# Patient Record
Sex: Female | Born: 1980
Health system: Southern US, Community
[De-identification: ages and names within clinical notes are randomized; demographics above are authoritative.]

## PROBLEM LIST (undated history)

## (undated) DIAGNOSIS — G43909 Migraine, unspecified, not intractable, without status migrainosus: Secondary | ICD-10-CM

## (undated) DIAGNOSIS — K760 Fatty (change of) liver, not elsewhere classified: Secondary | ICD-10-CM

## (undated) DIAGNOSIS — K649 Unspecified hemorrhoids: Secondary | ICD-10-CM

## (undated) DIAGNOSIS — E782 Mixed hyperlipidemia: Secondary | ICD-10-CM

## (undated) DIAGNOSIS — Z8669 Personal history of other diseases of the nervous system and sense organs: Secondary | ICD-10-CM

## (undated) DIAGNOSIS — N825 Female genital tract-skin fistulae: Secondary | ICD-10-CM

## (undated) DIAGNOSIS — Z800 Family history of malignant neoplasm of digestive organs: Secondary | ICD-10-CM

## (undated) DIAGNOSIS — J309 Allergic rhinitis, unspecified: Secondary | ICD-10-CM

## (undated) DIAGNOSIS — O1200 Gestational edema, unspecified trimester: Secondary | ICD-10-CM

## (undated) DIAGNOSIS — Z803 Family history of malignant neoplasm of breast: Secondary | ICD-10-CM

## (undated) DIAGNOSIS — F3289 Other specified depressive episodes: Secondary | ICD-10-CM

## (undated) DIAGNOSIS — Z8759 Personal history of other complications of pregnancy, childbirth and the puerperium: Secondary | ICD-10-CM

## (undated) DIAGNOSIS — M549 Dorsalgia, unspecified: Secondary | ICD-10-CM

## (undated) DIAGNOSIS — K219 Gastro-esophageal reflux disease without esophagitis: Secondary | ICD-10-CM

## (undated) DIAGNOSIS — Z87440 Personal history of urinary (tract) infections: Secondary | ICD-10-CM

## (undated) DIAGNOSIS — O139 Gestational [pregnancy-induced] hypertension without significant proteinuria, unspecified trimester: Secondary | ICD-10-CM

## (undated) DIAGNOSIS — F1010 Alcohol abuse, uncomplicated: Secondary | ICD-10-CM

## (undated) DIAGNOSIS — H40009 Preglaucoma, unspecified, unspecified eye: Secondary | ICD-10-CM

## (undated) DIAGNOSIS — F329 Major depressive disorder, single episode, unspecified: Secondary | ICD-10-CM

## (undated) DIAGNOSIS — F419 Anxiety disorder, unspecified: Secondary | ICD-10-CM

## (undated) DIAGNOSIS — F411 Generalized anxiety disorder: Secondary | ICD-10-CM

## (undated) DIAGNOSIS — J302 Other seasonal allergic rhinitis: Secondary | ICD-10-CM

## (undated) DIAGNOSIS — E282 Polycystic ovarian syndrome: Secondary | ICD-10-CM

## (undated) DIAGNOSIS — Z9189 Other specified personal risk factors, not elsewhere classified: Secondary | ICD-10-CM

## (undated) DIAGNOSIS — R109 Unspecified abdominal pain: Secondary | ICD-10-CM

## (undated) DIAGNOSIS — E559 Vitamin D deficiency, unspecified: Secondary | ICD-10-CM

## (undated) DIAGNOSIS — E669 Obesity, unspecified: Secondary | ICD-10-CM

## (undated) DIAGNOSIS — K7689 Other specified diseases of liver: Secondary | ICD-10-CM

## (undated) DIAGNOSIS — Z98891 History of uterine scar from previous surgery: Secondary | ICD-10-CM

## (undated) DIAGNOSIS — T7840XA Allergy, unspecified, initial encounter: Secondary | ICD-10-CM

## (undated) DIAGNOSIS — E739 Lactose intolerance, unspecified: Secondary | ICD-10-CM

## (undated) DIAGNOSIS — Z801 Family history of malignant neoplasm of trachea, bronchus and lung: Secondary | ICD-10-CM

## (undated) DIAGNOSIS — F32A Depression, unspecified: Secondary | ICD-10-CM

## (undated) DIAGNOSIS — Z9889 Other specified postprocedural states: Secondary | ICD-10-CM

## (undated) DIAGNOSIS — Z8371 Family history of colonic polyps: Secondary | ICD-10-CM

## (undated) DIAGNOSIS — H409 Unspecified glaucoma: Secondary | ICD-10-CM

## (undated) DIAGNOSIS — E88819 Insulin resistance, unspecified: Secondary | ICD-10-CM

## (undated) HISTORY — DX: Family history of malignant neoplasm of trachea, bronchus and lung: Z80.1

## (undated) HISTORY — DX: Dorsalgia, unspecified: M54.9

## (undated) HISTORY — DX: Other specified personal risk factors, not elsewhere classified: Z91.89

## (undated) HISTORY — DX: Other specified diseases of liver: K76.89

## (undated) HISTORY — PX: KNEE ARTHROSCOPY: SHX127

## (undated) HISTORY — DX: Generalized anxiety disorder: F41.1

## (undated) HISTORY — DX: Family history of malignant neoplasm of digestive organs: Z80.0

## (undated) HISTORY — DX: Obesity, unspecified: E66.9

## (undated) HISTORY — DX: Gestational edema, unspecified trimester: O12.00

## (undated) HISTORY — DX: Allergy, unspecified, initial encounter: T78.40XA

## (undated) HISTORY — DX: Gestational (pregnancy-induced) hypertension without significant proteinuria, unspecified trimester: O13.9

## (undated) HISTORY — DX: Family history of malignant neoplasm of breast: Z80.3

## (undated) HISTORY — DX: Migraine, unspecified, not intractable, without status migrainosus: G43.909

## (undated) HISTORY — DX: Unspecified glaucoma: H40.9

## (undated) HISTORY — DX: Other specified postprocedural states: Z98.89

## (undated) HISTORY — DX: Unspecified abdominal pain: R10.9

## (undated) HISTORY — DX: Major depressive disorder, single episode, unspecified: F32.9

## (undated) HISTORY — DX: Other specified depressive episodes: F32.89

## (undated) HISTORY — DX: Polycystic ovarian syndrome: E28.2

## (undated) HISTORY — DX: Gastro-esophageal reflux disease without esophagitis: K21.9

## (undated) HISTORY — DX: Fatty (change of) liver, not elsewhere classified: K76.0

## (undated) HISTORY — DX: Alcohol abuse, uncomplicated: F10.10

## (undated) HISTORY — DX: Family history of colonic polyps: Z83.71

## (undated) HISTORY — DX: Vitamin D deficiency, unspecified: E55.9

## (undated) HISTORY — DX: Lactose intolerance, unspecified: E73.9

## (undated) HISTORY — DX: Unspecified hemorrhoids: K64.9

## (undated) HISTORY — PX: OTHER SURGICAL HISTORY: SHX169

## (undated) HISTORY — DX: Allergic rhinitis, unspecified: J30.9

---
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
## 2002-05-25 HISTORY — PX: OTHER SURGICAL HISTORY: SHX169

## 2002-05-25 HISTORY — PX: WISDOM TOOTH EXTRACTION: SHX21

## 2003-01-08 ENCOUNTER — Other Ambulatory Visit: Admission: RE | Admit: 2003-01-08 | Discharge: 2003-01-08 | Payer: Self-pay | Admitting: Obstetrics and Gynecology

## 2004-09-08 ENCOUNTER — Encounter: Admission: RE | Admit: 2004-09-08 | Discharge: 2004-09-08 | Payer: Self-pay | Admitting: Family Medicine

## 2007-05-04 ENCOUNTER — Emergency Department (HOSPITAL_COMMUNITY): Admission: EM | Admit: 2007-05-04 | Discharge: 2007-05-04 | Payer: Self-pay | Admitting: Emergency Medicine

## 2008-10-18 ENCOUNTER — Inpatient Hospital Stay (HOSPITAL_COMMUNITY): Admission: AD | Admit: 2008-10-18 | Discharge: 2008-10-22 | Payer: Self-pay | Admitting: Obstetrics and Gynecology

## 2008-10-26 ENCOUNTER — Ambulatory Visit: Admission: RE | Admit: 2008-10-26 | Discharge: 2008-10-26 | Payer: Self-pay | Admitting: Certified Nurse Midwife

## 2008-10-27 ENCOUNTER — Encounter: Admission: RE | Admit: 2008-10-27 | Discharge: 2008-11-25 | Payer: Self-pay | Admitting: Certified Nurse Midwife

## 2008-11-26 ENCOUNTER — Encounter: Admission: RE | Admit: 2008-11-26 | Discharge: 2008-12-11 | Payer: Self-pay | Admitting: Certified Nurse Midwife

## 2009-03-27 ENCOUNTER — Ambulatory Visit: Payer: Self-pay | Admitting: Internal Medicine

## 2009-03-27 ENCOUNTER — Encounter (INDEPENDENT_AMBULATORY_CARE_PROVIDER_SITE_OTHER): Payer: Self-pay | Admitting: *Deleted

## 2009-03-27 DIAGNOSIS — Z87448 Personal history of other diseases of urinary system: Secondary | ICD-10-CM | POA: Insufficient documentation

## 2009-03-27 DIAGNOSIS — R5381 Other malaise: Secondary | ICD-10-CM

## 2009-03-27 DIAGNOSIS — Z9189 Other specified personal risk factors, not elsewhere classified: Secondary | ICD-10-CM | POA: Insufficient documentation

## 2009-03-27 DIAGNOSIS — K7689 Other specified diseases of liver: Secondary | ICD-10-CM

## 2009-03-27 DIAGNOSIS — R198 Other specified symptoms and signs involving the digestive system and abdomen: Secondary | ICD-10-CM

## 2009-03-27 DIAGNOSIS — R5383 Other fatigue: Secondary | ICD-10-CM

## 2009-03-27 DIAGNOSIS — J309 Allergic rhinitis, unspecified: Secondary | ICD-10-CM | POA: Insufficient documentation

## 2009-03-27 DIAGNOSIS — Z9889 Other specified postprocedural states: Secondary | ICD-10-CM | POA: Insufficient documentation

## 2009-03-28 LAB — CONVERTED CEMR LAB
ALT: 25 units/L (ref 0–35)
Albumin: 3.8 g/dL (ref 3.5–5.2)
Basophils Relative: 1.2 % (ref 0.0–3.0)
CO2: 27 meq/L (ref 19–32)
Calcium: 9.1 mg/dL (ref 8.4–10.5)
Creatinine, Ser: 0.9 mg/dL (ref 0.4–1.2)
Folate: >20 ng/mL
GFR calc non Af Amer: 78.33 mL/min (ref >60)
Glucose, Bld: 85 mg/dL (ref 70–99)
Lymphocytes Relative: 29.9 % (ref 12.0–46.0)
Monocytes Absolute: 0.8 10*3/uL (ref 0.1–1.0)
Monocytes Relative: 8.8 % (ref 3.0–12.0)
Neutro Abs: 5.1 10*3/uL (ref 1.4–7.7)
Neutrophils Relative %: 58.3 % (ref 43.0–77.0)
Sodium: 137 meq/L (ref 135–145)
TSH: 0.76 microintl units/mL (ref 0.35–5.50)
Total Protein: 7.7 g/dL (ref 6.0–8.3)
Vitamin B-12: 425 pg/mL (ref 211–911)

## 2009-04-16 ENCOUNTER — Telehealth: Payer: Self-pay | Admitting: Internal Medicine

## 2009-04-28 ENCOUNTER — Ambulatory Visit: Payer: Self-pay | Admitting: Family Medicine

## 2009-04-28 DIAGNOSIS — J0100 Acute maxillary sinusitis, unspecified: Secondary | ICD-10-CM

## 2009-11-04 ENCOUNTER — Telehealth: Payer: Self-pay | Admitting: Internal Medicine

## 2009-11-08 ENCOUNTER — Ambulatory Visit: Payer: Self-pay | Admitting: Internal Medicine

## 2009-11-08 DIAGNOSIS — F329 Major depressive disorder, single episode, unspecified: Secondary | ICD-10-CM

## 2009-11-08 DIAGNOSIS — F39 Unspecified mood [affective] disorder: Secondary | ICD-10-CM | POA: Insufficient documentation

## 2009-11-08 DIAGNOSIS — F411 Generalized anxiety disorder: Secondary | ICD-10-CM

## 2010-01-13 ENCOUNTER — Ambulatory Visit: Payer: Self-pay | Admitting: Internal Medicine

## 2010-03-25 ENCOUNTER — Ambulatory Visit: Payer: Self-pay | Admitting: Sports Medicine

## 2010-03-25 DIAGNOSIS — M25559 Pain in unspecified hip: Secondary | ICD-10-CM

## 2010-04-02 ENCOUNTER — Ambulatory Visit: Payer: Self-pay | Admitting: Family Medicine

## 2010-04-29 ENCOUNTER — Telehealth (INDEPENDENT_AMBULATORY_CARE_PROVIDER_SITE_OTHER): Payer: Self-pay | Admitting: *Deleted

## 2010-06-16 ENCOUNTER — Ambulatory Visit
Admission: RE | Admit: 2010-06-16 | Discharge: 2010-06-16 | Payer: Self-pay | Source: Home / Self Care | Attending: Internal Medicine | Admitting: Internal Medicine

## 2010-06-24 NOTE — Assessment & Plan Note (Signed)
Summary: NP hip pain   Vital Signs:  Patient profile:   30 year old female Weight:      202.4 pounds Pulse rate:   70 / minute BP sitting:   165 / 103  Primary Care Provider:  Newt Lukes MD   History of Present Illness: 30 yo F here for right buttock/hip pain  Patient is currently training for a half marathon Does not recall an acute injury States for past 3-4 weeks has had a dull aching pain in right buttock with pain to right lateral hip and groin. No numbness or tingling but shoots around these areas 'like nerve pain' Worse after running especially on hills - better during run Significantly worse past few days after running a 10 mile run. Tried stretches and yoga and over past 2 days has relented some - still can't run without pain or limp No bowel/bladder issues Has tried ibuprofen and icing  Problems Prior to Update: 1)  Depression  (ICD-311) 2)  Anxiety  (ICD-300.00) 3)  Acute Maxillary Sinusitis  (ICD-461.0) 4)  Other Symptoms Involving Digestive System Other  (ICD-787.99) 5)  Allergic Rhinitis  (ICD-477.9) 6)  Fatigue  (ICD-780.79) 7)  Family History Diabetes 1st Degree Relative  (ICD-V18.0) 8)  Family History of Colon Ca 1st Degree Relative <60  (ICD-V16.0) 9)  Family History Breast Cancer 1st Degree Relative <50  (ICD-V16.3) 10)  Arthroscopy, Knee, Hx of  (ICD-V45.89) 11)  Wisdom Teeth Extraction, Hx of  (ICD-V15.9) 12)  Hepatic Cyst  (ICD-573.8) 13)  Uti's, Hx of  (ICD-V13.00)  Medications Prior to Update: 1)  Pre-Natal  Tabs (Prenatal Multivit-Min-Fe-Fa) .... Take 1 By Mouth Qd 2)  Zyrtec Hives Relief 10 Mg Tabs (Cetirizine Hcl) .... Take 1 By Mouth At Bedtime 3)  Advil 200 Mg Tabs (Ibuprofen) .... Prn 4)  Singulair 10 Mg Tabs (Montelukast Sodium) .Marland Kitchen.. 1 By Mouth Once Daily As Needed 5)  Nasonex 50 Mcg/act Susp (Mometasone Furoate) .Marland Kitchen.. 1 Spray Each Nostril  Every Morning 6)  Alprazolam 0.5 Mg Tabs (Alprazolam) .... Take 1 By Mouth Q 6 Hours As  Needed For Anxiety 7)  Cymbalta 30 Mg Cpep (Duloxetine Hcl) .Marland Kitchen.. 1 By Mouth Once Daily  Allergies (verified): 1)  ! Macrobid  Family History: Reviewed history from 03/27/2009 and no changes required. Family History Breast cancer 1st degree relative <50 (other relative) Family History of Colon CA 1st degree relative <60 (parent & grandparent) Family History Diabetes 1st degree relative (grandparent) Family History High cholesterol (grandparent) Family History Hypertension (parent) Family History Lung cancer (grandparent)  dad expired age 38yo of met colon ca - (dx age 39)  Social History: Reviewed history from 03/27/2009 and no changes required. 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  alert, well-developed, well-nourished, and cooperative to examination.   Msk:  Back: No gross deformity, swelling, bruising. No pain with back ROM. No midline or paraspinal TTP.  No TTP SI joints. Negative SLRs  R hip: No gross deformity, swelling, bruising. TTP greater trochanter, anterior right hip within musculature and less so near piriformis. FROM with negative log roll. Strength 4/5 with hip ext rotation and abduction - pain on abduction worse than ext rotation.  5/5 hip flexion, extension, int rotation, sartorius testing. Thomas test without pain but decreased motion R vs L. Mild pain with  piriformis on right.  Negative fabers bilaterally Sensation intact distally.  Mild pes planus bilaterally but not worse on right. Gait with crossing midline on right, trendelenberg.  Worsens with running.   Impression & Recommendations:  Problem # 1:  HIP PAIN, RIGHT (ICD-719.45) Assessment New 2/2 muscle weakness in abductors and hip ext rotators.  Has element of ITBS and possible trochanteric  bursitis as well.  Shown home exercises to do daily for hip abduction, ext rotation and handouts provided.  Stretches also demonstrated for piriformis and ITBS.  Decrease running time and focus on exercises and stretches leading up to race.  Advised better to go into race at optimum health with slight decrease in training - and she has been training 13 weeks so any gains these last 2 weeks will be minimal comparatively.  See instructions for further.  F/u in 6 weeks for reevaluation.  Her updated medication list for this problem includes:    Advil 200 Mg Tabs (Ibuprofen) .Marland Kitchen... Prn  Complete Medication List: 1)  Pre-natal Tabs (Prenatal multivit-min-fe-fa) .... Take 1 by mouth qd 2)  Zyrtec Hives Relief 10 Mg Tabs (Cetirizine hcl) .... Take 1 by mouth at bedtime 3)  Advil 200 Mg Tabs (Ibuprofen) .... Prn 4)  Singulair 10 Mg Tabs (Montelukast sodium) .Marland Kitchen.. 1 by mouth once daily as needed 5)  Nasonex 50 Mcg/act Susp (Mometasone furoate) .Marland Kitchen.. 1 spray each nostril  every morning 6)  Alprazolam 0.5 Mg Tabs (Alprazolam) .... Take 1 by mouth q 6 hours as needed for anxiety 7)  Cymbalta 30 Mg Cpep (Duloxetine hcl) .Marland Kitchen.. 1 by mouth once daily  Patient Instructions: 1)  Your primary issue is muscle weakness within your Piriformis, IT band, hip external rotators. 2)  Treatment involves specific exercises and stretches. 3)  Running is ok as long as you are not limping and pain is less than a 3/10. 4)  It's ok to take aleve 2 tabs twice a day with food for pain and inflammation. 5)  I would recommend you limit running between now and the race and focus on doing the exercises and stretches between now and then. 6)  Pick 2-3 stretches that allow you to feel the stretch in that area and hold for 20-30 seconds, do 3 of each of these. 7)  Hip abduction 3 x 10, standing hip rotation 3 x 10 once or twice a day.  Can add ankle weights if these become too easy. 8)  Follow up with me in 6 weeks.   Orders Added: 1)   New Patient Level III [84132]

## 2010-06-24 NOTE — Progress Notes (Signed)
Summary: problems - anxiety/depression  Phone Note Other Incoming   Summary of Call: I recieved call from pt spouse re: pt having increasing difficulty with depression and anxiety, esp as their son has "hit a few roadblocks" - pt has taken xanax in past after death of her father and needs something now " to take edge off" until she can get in for OV to review symptoms and med mgmt of same - appt to be scheduled this week (done - friday) - xanax called in to [facility] pharm Initial call taken by: Newt Lukes MD,  November 04, 2009 5:40 PM    New/Updated Medications: ALPRAZOLAM 0.5 MG TABS (ALPRAZOLAM) take 1 by mouth q 6 hours as needed for anxiety Prescriptions: ALPRAZOLAM 0.5 MG TABS (ALPRAZOLAM) take 1 by mouth q 6 hours as needed for anxiety  #40 x 1   Entered by:   Orlan Leavens   Authorized by:   Newt Lukes MD   Signed by:   Orlan Leavens on 11/04/2009   Method used:   Telephoned to ...       [facility]* (retail)       [address]       [address] Shipping/mailing       [place], [place]  [address]       Ph: [phone]       Fax: [phone]   RxID:   [id number]

## 2010-06-24 NOTE — Assessment & Plan Note (Signed)
Summary: ANXIETY/CD   Vital Signs:  Patient profile:   30 year old female Height:      65 inches (165.10 cm) Weight:      200.0 pounds (90.91 kg) O2 Sat:      98 % on Room air Temp:     99.0 degrees F (37.22 degrees C) oral Pulse rate:   74 / minute BP sitting:   120 / 72  (left arm) Cuff size:   large  Vitals Entered By: Orlan Leavens (November 08, 2009 11:27 AM)  O2 Flow:  Room air CC: Anxiety, Depressive symptoms Is Patient Diabetic? No Pain Assessment Patient in pain? no        Primary Care Provider:  Newt Lukes MD  CC:  Anxiety and Depressive symptoms.  History of Present Illness:  Depressive Symptoms with anxiety      This is a 30 year old woman who presents with Depressive/anxiety symptoms.  The symptoms began 6-12 months ago.  The severity is described as moderate -severe.  progressive symptoms with exacerbation by the "ups and downs" of son's health issues.  The patient reports loss of interest/pleasure, significant weight gain, and psychomotor agitation, but denies depressed mood.  The patient also reports diminished concentration.  The patient denies feelings of worthlessness, indecisiveness, thoughts of death, thoughts of suicide, suicidal intent, and suicidal plans.  The patient reports the following psychosocial stressors: major life changes and birth of a child with Jeral Pinch who is having feeding problems.  Patient's past history includes depression and anxiety following death of her father and family hx of depression.  The patient reports the following manic symptoms: abnormally irritable mood and distractibility.  The patient denies abnormally elevated mood, flight of ideas, increased goal-directed activity, and inflated self-esteem/ grandiosity.  Has recently changed work availability to relief hours and has no scheduled shifts for next week.  Current Medications (verified): 1)  Pre-Natal  Tabs (Prenatal Multivit-Min-Fe-Fa) .... Take 1 By Mouth Qd 2)  Zyrtec Hives  Relief 10 Mg Tabs (Cetirizine Hcl) .... Take 1 By Mouth At Bedtime 3)  Advil 200 Mg Tabs (Ibuprofen) .... Prn 4)  Singulair 10 Mg Tabs (Montelukast Sodium) .Marland Kitchen.. 1 By Mouth Once Daily 5)  Nasonex 50 Mcg/act Susp (Mometasone Furoate) .Marland Kitchen.. 1 Spray Each Nostril  Every Morning 6)  Alprazolam 0.5 Mg Tabs (Alprazolam) .... Take 1 By Mouth Q 6 Hours As Needed For Anxiety  Allergies (verified): 1)  ! Macrobid  Past History:  Past Medical History: seasonal allergies  Review of Systems       The patient complains of weight gain.  The patient denies anorexia, chest pain, headaches, and abdominal pain.    Physical Exam  General:  overweight-appearing.  alert, well-developed, well-nourished, and cooperative to examination.  son [name] also here  Lungs:  normal respiratory effort, no intercostal retractions or use of accessory muscles; normal breath sounds bilaterally - no crackles and no wheezes.    Heart:  normal rate, regular rhythm, no murmur, and no rub. BLE without edema. Psych:  Oriented X3, memory intact for recent and remote, normally interactive, good eye contact, mod anxious appearing and mildly depressed appearing, occ tearful spells during hx and exam   Impression & Recommendations:  Problem # 1:  ANXIETY (ICD-300.00)  ok to cont xanax as needed -called in earlier in the week - see phone note also willing to try mood stabilizer - risk/benefits and poss SE discussed - cymbalta sample and erx done f/u 4-6 weeks to  re-eval symptoms and meds Her updated medication list for this problem includes:    Alprazolam 0.5 Mg Tabs (Alprazolam) .Marland Kitchen... Take 1 by mouth q 6 hours as needed for anxiety    Cymbalta 30 Mg Cpep (Duloxetine hcl) .Marland Kitchen... 1 by mouth once daily  Orders: Prescription Created Electronically [phone])  Problem # 2:  DEPRESSION (ICD-311)  see above - support offered Her updated medication list for this problem includes:    Alprazolam 0.5 Mg Tabs (Alprazolam) .Marland Kitchen... Take 1 by  mouth q 6 hours as needed for anxiety    Cymbalta 30 Mg Cpep (Duloxetine hcl) .Marland Kitchen... 1 by mouth once daily  Orders: Prescription Created Electronically [id number])  Complete Medication List: 1)  Pre-natal Tabs (Prenatal multivit-min-fe-fa) .... Take 1 by mouth qd 2)  Zyrtec Hives Relief 10 Mg Tabs (Cetirizine hcl) .... Take 1 by mouth at bedtime 3)  Advil 200 Mg Tabs (Ibuprofen) .... Prn 4)  Singulair 10 Mg Tabs (Montelukast sodium) .Marland Kitchen.. 1 by mouth once daily 5)  Nasonex 50 Mcg/act Susp (Mometasone furoate) .Marland Kitchen.. 1 spray each nostril  every morning 6)  Alprazolam 0.5 Mg Tabs (Alprazolam) .... Take 1 by mouth q 6 hours as needed for anxiety 7)  Cymbalta 30 Mg Cpep (Duloxetine hcl) .Marland Kitchen.. 1 by mouth once daily  Patient Instructions: 1)  it was good to see you today.  2)  start cymbalta as discussed - your prescription has been electronically submitted to your pharmacy. Please take as directed. Contact our office if you believe you're having problems with the medication(s).  3)  Please schedule a follow-up appointment in 4-6 weeks, sooner if problems.  Prescriptions: CYMBALTA 30 MG CPEP (DULOXETINE HCL) 1 by mouth once daily  #30 x 3   Entered and Authorized by:   Newt Lukes MD   Signed by:   Newt Lukes MD on 11/08/2009   Method used:   Electronically to        [facility]* (retail)       [address]       [address] Shipping/mailing       [place], [place]  [address]       Ph: [phone]       Fax: [phone]   RxID:   [id number]

## 2010-06-24 NOTE — Assessment & Plan Note (Signed)
Summary: FU--PER PT---D/T---STC   Vital Signs:  Patient profile:   30 year old female Height:      65 inches (165.10 cm) Weight:      205.0 pounds (93.18 kg) BMI:     34.24 O2 Sat:      98 % on Room air Temp:     98.6 degrees F (37.00 degrees C) oral Pulse rate:   67 / minute BP sitting:   128 / 72  (left arm) Cuff size:   regular  Vitals Entered By: Orlan Leavens RMA (January 13, 2010 4:01 PM)  O2 Flow:  Room air CC: follow-up visit Is Patient Diabetic? No   Primary Care Provider:  Newt Lukes MD  CC:  follow-up visit.  History of Present Illness:  Depressive Symptoms with anxiety      This is a 30 year old woman who presents with Depressive/anxiety symptoms.  The symptoms began 6-12 months ago.  The severity is described as moderate -severe.  progressive symptoms with exacerbation by the "ups and downs" of son's health issues.  The patient reports loss of interest/pleasure, significant weight gain, and psychomotor agitation, but denies depressed mood.  The patient also reports diminished concentration.  The patient denies feelings of worthlessness, indecisiveness, thoughts of death, thoughts of suicide, suicidal intent, and suicidal plans.  The patient reports the following psychosocial stressors: major life changes and birth of a child with Jeral Pinch who is having feeding problems.  Patient's past history includes depression and anxiety following death of her father and family hx of depression.  The patient reports the following manic symptoms: abnormally irritable mood and distractibility.  The patient denies abnormally elevated mood, flight of ideas, increased goal-directed activity, and inflated self-esteem/ grandiosity.  Has recently changed work availability to relief hours and has no scheduled shifts for next week.  since last OV, started on cymablta and doing well -  less irritable, less anxiety  Clinical Review Panels:  CBC   WBC:  8.9 (03/27/2009)   RBC:  4.79  (03/27/2009)   Hgb:  14.6 (03/27/2009)   Hct:  42.1 (03/27/2009)   Platelets:  225.0 (03/27/2009)   MCV  87.8 (03/27/2009)   MCHC  34.8 (03/27/2009)   RDW  12.3 (03/27/2009)   PMN:  58.3 (03/27/2009)   Lymphs:  29.9 (03/27/2009)   Monos:  8.8 (03/27/2009)   Eosinophils:  1.8 (03/27/2009)   Basophil:  1.2 (03/27/2009)  Complete Metabolic Panel   Glucose:  85 (03/27/2009)   Sodium:  137 (03/27/2009)   Potassium:  3.7 (03/27/2009)   Chloride:  102 (03/27/2009)   CO2:  27 (03/27/2009)   BUN:  7 (03/27/2009)   Creatinine:  0.9 (03/27/2009)   Albumin:  3.8 (03/27/2009)   Total Protein:  7.7 (03/27/2009)   Calcium:  9.1 (03/27/2009)   Total Bili:  0.8 (03/27/2009)   Alk Phos:  81 (03/27/2009)   SGPT (ALT):  25 (03/27/2009)   SGOT (AST):  25 (03/27/2009)   Current Medications (verified): 1)  Pre-Natal  Tabs (Prenatal Multivit-Min-Fe-Fa) .... Take 1 By Mouth Qd 2)  Zyrtec Hives Relief 10 Mg Tabs (Cetirizine Hcl) .... Take 1 By Mouth At Bedtime 3)  Advil 200 Mg Tabs (Ibuprofen) .... Prn 4)  Singulair 10 Mg Tabs (Montelukast Sodium) .Marland Kitchen.. 1 By Mouth Once Daily As Needed 5)  Nasonex 50 Mcg/act Susp (Mometasone Furoate) .Marland Kitchen.. 1 Spray Each Nostril  Every Morning 6)  Alprazolam 0.5 Mg Tabs (Alprazolam) .... Take 1 By Mouth Q 6 Hours  As Needed For Anxiety 7)  Cymbalta 30 Mg Cpep (Duloxetine Hcl) .Marland Kitchen.. 1 By Mouth Once Daily  Allergies (verified): 1)  ! Macrobid  Past History:  Past Medical History: seasonal allergies anxiety  MD roster: gyn - [name]  Review of Systems  The patient denies anorexia, weight loss, weight gain, and peripheral edema.    Physical Exam  General:  overweight-appearing.  alert, well-developed, well-nourished, and cooperative to examination.   Lungs:  normal respiratory effort, no intercostal retractions or use of accessory muscles; normal breath sounds bilaterally - no crackles and no wheezes.    Heart:  normal rate, regular rhythm, no murmur, and  no rub. BLE without edema. Psych:  Oriented X3, memory intact for recent and remote, normally interactive, good eye contact, not anxious appearing and not depressed appearing   Impression & Recommendations:  Problem # 1:  ANXIETY (ICD-300.00)  Her updated medication list for this problem includes:    Alprazolam 0.5 Mg Tabs (Alprazolam) .Marland Kitchen... Take 1 by mouth q 6 hours as needed for anxiety    Cymbalta 30 Mg Cpep (Duloxetine hcl) .Marland Kitchen... 1 by mouth once daily  ok to cont xanax as needed -not needing since started cymbalta 10/2009 cont same as improved symptoms   Problem # 2:  DEPRESSION (ICD-311)  Her updated medication list for this problem includes:    Alprazolam 0.5 Mg Tabs (Alprazolam) .Marland Kitchen... Take 1 by mouth q 6 hours as needed for anxiety    Cymbalta 30 Mg Cpep (Duloxetine hcl) .Marland Kitchen... 1 by mouth once daily  see above -improved  Complete Medication List: 1)  Pre-natal Tabs (Prenatal multivit-min-fe-fa) .... Take 1 by mouth qd 2)  Zyrtec Hives Relief 10 Mg Tabs (Cetirizine hcl) .... Take 1 by mouth at bedtime 3)  Advil 200 Mg Tabs (Ibuprofen) .... Prn 4)  Singulair 10 Mg Tabs (Montelukast sodium) .Marland Kitchen.. 1 by mouth once daily as needed 5)  Nasonex 50 Mcg/act Susp (Mometasone furoate) .Marland Kitchen.. 1 spray each nostril  every morning 6)  Alprazolam 0.5 Mg Tabs (Alprazolam) .... Take 1 by mouth q 6 hours as needed for anxiety 7)  Cymbalta 30 Mg Cpep (Duloxetine hcl) .Marland Kitchen.. 1 by mouth once daily  Patient Instructions: 1)  it was good to see you today. 2)  keep doing what you are doing - no changes in medications 3)  Please schedule a follow-up appointment in 6-9 months, sooner if problems.  Prescriptions: CYMBALTA 30 MG CPEP (DULOXETINE HCL) 1 by mouth once daily  #30 x 11   Entered and Authorized by:   Newt Lukes MD   Signed by:   Newt Lukes MD on 01/13/2010   Method used:   Electronically to        [facility]* (retail)       [address]       [address] Shipping/mailing       [place], [place]  [address]       Ph: [phone]       Fax: [phone]   RxID:   [id number]

## 2010-06-24 NOTE — Progress Notes (Signed)
  Phone Note Other Incoming   Request: Send information Summary of Call: Request for records received from [facility]. Request forwarded to [employer]. Felicity Coyer)

## 2010-06-24 NOTE — Assessment & Plan Note (Signed)
Summary: CORTISONE INJECTION/LP   Vital Signs:  Patient profile:   30 year old female Height:      65 inches (165.10 cm) Weight:      202.6 pounds (92.09 kg) BMI:     33.84 Temp:     98.4 degrees F (36.89 degrees C) oral Pulse rate:   71 / minute BP sitting:   139 / 77  (left arm)  Vitals Entered By: Baxter Hire) (April 02, 2010 10:42 AM) CC: cortisone injection  Pain Assessment Patient in pain? yes     Location: right hip Intensity: 2 Onset of pain  pain gets worse when walking Nutritional Status BMI of > 30 = obese  Does patient need assistance? Functional Status Self care Ambulation Normal   Primary Care Provider:  Newt Lukes MD  CC:  cortisone injection .  History of Present Illness: Patient returns today for 1 week f/u for cortisone injection of right hip.  We discussed this as a possible option though exercises/stretches ultimately need to be performed to help in long run. Decided to proceed with right greater trochateric bursa injection.  Habits & Providers  Alcohol-Tobacco-Diet     Alcohol drinks/day: occassionally     Tobacco Status: never  Problems Prior to Update: 1)  Hip Pain, Right  (ICD-719.45) 2)  Depression  (ICD-311) 3)  Anxiety  (ICD-300.00) 4)  Acute Maxillary Sinusitis  (ICD-461.0) 5)  Other Symptoms Involving Digestive System Other  (WUJ-811.91) 6)  Allergic Rhinitis  (ICD-477.9) 7)  Fatigue  (ICD-780.79) 8)  Family History Diabetes 1st Degree Relative  (ICD-V18.0) 9)  Family History of Colon Ca 1st Degree Relative <60  (ICD-V16.0) 10)  Family History Breast Cancer 1st Degree Relative <50  (ICD-V16.3) 11)  Arthroscopy, Knee, Hx of  (ICD-V45.89) 12)  Wisdom Teeth Extraction, Hx of  (ICD-V15.9) 13)  Hepatic Cyst  (ICD-573.8) 14)  Uti's, Hx of  (ICD-V13.00)  Medications Prior to Update: 1)  Pre-Natal  Tabs (Prenatal Multivit-Min-Fe-Fa) .... Take 1 By Mouth Qd 2)  Zyrtec Hives Relief 10 Mg Tabs (Cetirizine Hcl) .... Take  1 By Mouth At Bedtime 3)  Advil 200 Mg Tabs (Ibuprofen) .... Prn 4)  Singulair 10 Mg Tabs (Montelukast Sodium) .Marland Kitchen.. 1 By Mouth Once Daily As Needed 5)  Nasonex 50 Mcg/act Susp (Mometasone Furoate) .Marland Kitchen.. 1 Spray Each Nostril  Every Morning 6)  Alprazolam 0.5 Mg Tabs (Alprazolam) .... Take 1 By Mouth Q 6 Hours As Needed For Anxiety 7)  Cymbalta 30 Mg Cpep (Duloxetine Hcl) .Marland Kitchen.. 1 By Mouth Once Daily  Allergies: 1)  ! Macrobid  Physical Exam  General:  overweight-appearing.  alert, well-developed, well-nourished, and cooperative to examination.   Msk:  R hip: No gross deformity, swelling, bruising. TTP greater trochanter, anterior right hip within musculature and less so near piriformis. FROM with negative log roll.   Impression & Recommendations:  Problem # 1:  HIP PAIN, RIGHT (ICD-719.45) Assessment Unchanged After informed verbal consent patient was lying on left side on exam table and area over greater trochanteric bursa was prepped with iodine and alcohol swab.  Area of greater trochanteric bursa was then injected with 2:6 depomedrol:marcaine.  Patient tolerated the procedure well without any immediate complications.  F/u in 5 weeks as previously discussed.  Her updated medication list for this problem includes:    Advil 200 Mg Tabs (Ibuprofen) .Marland Kitchen... Prn  Orders: Joint Aspirate / Injection, Large ([address]) No Charge Patient Arrived (NCPA0) (NCPA0)  Complete Medication List: 1)  Pre-natal Tabs (Prenatal multivit-min-fe-fa) .... Take 1 by mouth qd 2)  Zyrtec Hives Relief 10 Mg Tabs (Cetirizine hcl) .... Take 1 by mouth at bedtime 3)  Advil 200 Mg Tabs (Ibuprofen) .... Prn 4)  Singulair 10 Mg Tabs (Montelukast sodium) .Marland Kitchen.. 1 by mouth once daily as needed 5)  Nasonex 50 Mcg/act Susp (Mometasone furoate) .Marland Kitchen.. 1 spray each nostril  every morning 6)  Alprazolam 0.5 Mg Tabs (Alprazolam) .... Take 1 by mouth q 6 hours as needed for anxiety 7)  Cymbalta 30 Mg Cpep (Duloxetine hcl) .Marland Kitchen.. 1  by mouth once daily   Orders Added: 1)  Joint Aspirate / Injection, Large [206[address]] 2)  No Charge Patient Arrived (NCPA0) [NCPA0]

## 2010-06-26 NOTE — Assessment & Plan Note (Signed)
Summary: needs to talk about weaning off cymbalta/   Vital Signs:  Patient profile:   30 year old female Height:      Jamie inches (165.10 cm) Weight:      202 pounds (91.82 kg) O2 Sat:      96 % on Room air Temp:     97.9 degrees F (36.61 degrees C) oral Pulse rate:   60 / minute BP sitting:   118 / 78  (left arm) Cuff size:   regular  Vitals Entered By: Orlan Leavens RMA (June 16, 2010 3:46 PM)  O2 Flow:  Room air CC: Discuss cymbalta Is Patient Diabetic? No Pain Assessment Patient in pain? no        Primary Care Provider:  Newt Lukes MD  CC:  Discuss cymbalta.  History of Present Illness:  Depressive Symptoms with anxiety      This is a Jamie Nelson who presents 10/2009 with Depressive/anxiety symptoms.  The symptoms began 6-12 months ago.  The severity is described as moderate -severe.  progressive symptoms with exacerbation by the "ups and downs" of son's health issues (downs).  The patient reports loss of interest/pleasure, significant weight gain, and psychomotor agitation, but denies depressed mood.  The patient also reports diminished concentration.  The patient denies feelings of worthlessness, indecisiveness, thoughts of death, thoughts of suicide, suicidal intent, and suicidal plans.  The patient reports the following psychosocial stressors: major life changes and birth of a child with Jeral Pinch who is having feeding problems.  Patient's past history includes depression and anxiety following death of her father and family hx of depression.  The patient reports the following manic symptoms: abnormally irritable mood and distractibility.  The patient denies abnormally elevated mood, flight of ideas, increased goal-directed activity, and inflated self-esteem/ grandiosity.  Has recently changed work availability to relief hours and has no scheduled shifts for next week.  since 10/2009, started on cymablta and doing well -  less irritable, less anxiety  now 05/2010  would like to wean off same - mood stable, no anxious or depression symptoms   Current Medications (verified): 1)  Pre-Natal  Tabs (Prenatal Multivit-Min-Fe-Fa) .... Take 1 By Mouth Qd 2)  Zyrtec Hives Relief 10 Mg Tabs (Cetirizine Hcl) .... Take 1 By Mouth At Bedtime 3)  Advil 200 Mg Tabs (Ibuprofen) .... Prn 4)  Singulair 10 Mg Tabs (Montelukast Sodium) .Marland Kitchen.. 1 By Mouth Once Daily As Needed 5)  Nasonex 50 Mcg/act Susp (Mometasone Furoate) .Marland Kitchen.. 1 Spray Each Nostril  Every Morning 6)  Alprazolam 0.5 Mg Tabs (Alprazolam) .... Take 1 By Mouth Q 6 Hours As Needed For Anxiety 7)  Cymbalta 30 Mg Cpep (Duloxetine Hcl) .Marland Kitchen.. 1 By Mouth Once Daily  Allergies (verified): 1)  ! Macrobid  Past History:  Past Medical History: seasonal allergies anxiety  MD roster:  gyn - [name]  Review of Systems  The patient denies weight loss, chest pain, headaches, and abdominal pain.    Physical Exam  General:  overweight-appearing.  alert, well-developed, well-nourished, and cooperative to examination.  son at side Lungs:  normal respiratory effort, no intercostal retractions or use of accessory muscles; normal breath sounds bilaterally - no crackles and no wheezes.    Heart:  normal rate, regular rhythm, no murmur, and no rub. BLE without edema. Psych:  Oriented X3, memory intact for recent and remote, normally interactive, good eye contact, not anxious appearing and not depressed appearing   Impression & Recommendations:  Problem #  1:  ANXIETY (ICD-300.00)  Her updated medication list for this problem includes:    Alprazolam 0.5 Mg Tabs (Alprazolam) .Marland Kitchen... Take 1 by mouth q 6 hours as needed for anxiety    Cymbalta 20 Mg Cpep (Duloxetine hcl) .Marland Kitchen... 1 by mouth once daily (or as directed)   started cymbalta 10/2009 -improved symptoms now stablize ready to wean off same but feels ha. dizzy when misses dose - decorease dose for 2 weeks, then every other day x 2 weeks - to call if WD symptoms  prevent wean - can try again in spring if needed  Orders: Prescription Created Electronically [phone])  Problem # 2:  DEPRESSION (ICD-311)  Her updated medication list for this problem includes:    Alprazolam 0.5 Mg Tabs (Alprazolam) .Marland Kitchen... Take 1 by mouth q 6 hours as needed for anxiety    Cymbalta 20 Mg Cpep (Duloxetine hcl) .Marland Kitchen... 1 by mouth once daily (or as directed)   see above -improved  Complete Medication List: 1)  Pre-natal Tabs (Prenatal multivit-min-fe-fa) .... Take 1 by mouth qd 2)  Zyrtec Hives Relief 10 Mg Tabs (Cetirizine hcl) .... Take 1 by mouth at bedtime 3)  Advil 200 Mg Tabs (Ibuprofen) .... As needed 4)  Singulair 10 Mg Tabs (Montelukast sodium) .Marland Kitchen.. 1 by mouth once daily as needed 5)  Nasonex 50 Mcg/act Susp (Mometasone furoate) .Marland Kitchen.. 1 spray each nostril  every morning 6)  Alprazolam 0.5 Mg Tabs (Alprazolam) .... Take 1 by mouth q 6 hours as needed for anxiety 7)  Cymbalta 20 Mg Cpep (Duloxetine hcl) .Marland Kitchen.. 1 by mouth once daily (or as directed)  Patient Instructions: 1)  it was good to see you today. 2)  change cymbalta to 20mg  once daily x 2 weeks, then take every other day x 2 weeks then stop (or as directed) Prescriptions: CYMBALTA 20 MG CPEP (DULOXETINE HCL) 1 by mouth once daily (or as directed)  #30 x 1   Entered and Authorized by:   Newt Lukes MD   Signed by:   Newt Lukes MD on 06/16/2010   Method used:   Electronically to        [facility]* (retail)       [address]       [address]/mailing       [place], [place]  [address]       Ph: [phone]       Fax: [phone]   RxID:   [id number]    Orders Added: 1)  Est. Patient Level IV [25852] 2)  Prescription Created Electronically [phone]

## 2010-09-02 LAB — URINE CULTURE
Colony Count: >=100000
Special Requests: POSITIVE

## 2010-09-02 LAB — CBC
Hemoglobin: 9.4 g/dL — ABNORMAL LOW (ref 12.0–15.0)
MCHC: 34.5 g/dL (ref 30.0–36.0)
MCHC: 35.1 g/dL (ref 30.0–36.0)
Platelets: 128 10*3/uL — ABNORMAL LOW (ref 150–400)
RBC: 3.13 MIL/uL — ABNORMAL LOW (ref 3.87–5.11)
RDW: 14.4 % (ref 11.5–15.5)
WBC: 10.4 10*3/uL (ref 4.0–10.5)
WBC: 15.4 10*3/uL — ABNORMAL HIGH (ref 4.0–10.5)

## 2010-09-02 LAB — URINALYSIS, MICROSCOPIC ONLY
Ketones, ur: NEGATIVE mg/dL
Specific Gravity, Urine: <1.005 — ABNORMAL LOW (ref 1.005–1.030)

## 2010-09-02 LAB — RPR: RPR Ser Ql: NONREACTIVE

## 2010-09-03 ENCOUNTER — Ambulatory Visit (INDEPENDENT_AMBULATORY_CARE_PROVIDER_SITE_OTHER): Payer: 59

## 2010-09-03 DIAGNOSIS — Z0000 Encounter for general adult medical examination without abnormal findings: Secondary | ICD-10-CM

## 2010-09-05 ENCOUNTER — Encounter: Payer: Self-pay | Admitting: Internal Medicine

## 2010-09-05 LAB — TB SKIN TEST: TB Skin Test: NEGATIVE mm

## 2010-10-07 NOTE — Op Note (Signed)
Jamie Nelson, Jamie Nelson              ACCOUNT NO.:  [id number]   MEDICAL RECORD NO.:  [id number]          PATIENT TYPE:  INP   LOCATION:  9174                          FACILITY:  [facility]   PHYSICIAN:  Lendon Colonel, MD   DATE OF BIRTH:  June 02, 1980   DATE OF PROCEDURE:  10/19/2008  DATE OF DISCHARGE:                               OPERATIVE REPORT   PREOPERATIVE DIAGNOSIS:  Arrest of dilation.   POSTOPERATIVE DIAGNOSIS:  Arrest of dilation.   PROCEDURE:  Primary low transverse cesarean section.   SURGEON:  Lendon Colonel, MD   ASSISTANT:  [name], CNM   ANESTHESIA:  Epidural.   FINDINGS:  Apgars 9 and 9, female infant, 6 pounds 11 ounces in the ROT  position.  Clear amniotic fluid.  Normal placenta.  Normal uterus,  normal tubes.   SPECIMENS:  None.   DISPOSITION:  To L and D.   ANTIBIOTICS:  2 g of Ancef.   ESTIMATED BLOOD LOSS:  600 mL.   COMPLICATIONS:  None.   INDICATIONS:  This is a 30 year old G1 admitted after spontaneous  rupture of membranes at 38+ weeks, had augmentation of labor and  eventually arrest of dilation at 6 cm.  Risks, benefits, and  alternatives of C-section were discussed with the patient.  The decision  was made to proceed with operative delivery.   DETAILS OF PROCEDURE:  After informed consent was obtained, the patient  was taken to the operating room where epidural anesthesia was found to  be adequate.  She was prepped and draped in normal sterile fashion in  dorsal supine position with a leftward tilt.  A Foley catheter had been  inserted into the bladder in the labor room.  A Pfannenstiel skin  incision was made 2 cm above the pubic symphysis in the midline with the  scalpel, carried through underlying layer of fascia with the Bovie  cautery.  The fascia was incised in the midline.  Incision was continued  laterally with the Mayo scissors.  The inferior aspect of the fascial  incision was grasped with Kocher clamps, elevated up and the  underlying  rectus muscles dissected off sharply.  The superior aspect of fascial  incision was grasped with Kocher clamps, elevated up and the underlying  rectus muscles were dissected off sharply.  The rectus muscles were  separated bluntly in the midline.  The pyramidalis muscles were  separated sharply with the Mayo scissors.  Peritoneum was identified,  entered bluntly, extended superiorly and inferiorly with good  visualization of the bladder.  The bladder blade was inserted.  The  vesicouterine peritoneum was grasped with the pickups, entered sharply  with Metzenbaum scissors.  The incision was extended laterally with the  Metzenbaum scissors.  Bladder flap was then created digitally.  The  lower uterine segment was incised in transverse fashion with the  scalpel, extension bluntly.  Retractors removed.  Infant's occiput was  brought to the incision.  The infant was delivered without complication,  bulb suctioned, cord clamped and cut.  Infant taken to the awaiting  pediatrician.  The placenta was expressed.  The uterus was exteriorized,  cleared of all clots and debris.  A small left inferior extension was  noted.  This was repaired with a figure-of-sixteen suture.  The uterine  incision was then repaired with 0 Vicryl in a running locked fashion.  A  second layer of same suture was used in imbricating fashion to obtain  excellent hemostasis.  The uterus was returned to the abdomen.  All  tagged edges were reinspected.  Good hemostasis was noted from the  uterine incision.  The pelvis was irrigated with warm normal saline.  Once hemostatic, the peritoneum was closed with 2-0 Vicryl in a running  fashion.  Cut muscle edges underside of fascia were inspected, found to  be hemostatic.  The fascia was closed with 0 Vicryl in a running  fashion.  A single figure-of-eight was used in the midpoint of the  fascial incision where only one layer had gotten closed.  This completed  two-layer  closure of the fascia along its entire length.  The  subcuteneous tissue was irrigated.  Several small capillary bleeders  were Bovie cautered for hemostasis.  The Scarpa's layer was closed with  the 2-0 plain and the skin was closed with staples.  The patient  tolerated the procedure well.  Sponge, lap, and needle counts were  correct x3.  The patient was taken to the recovery room in stable  condition.      Lendon Colonel, MD  Electronically Signed     KAF/MEDQ  D:  10/19/2008  T:  10/19/2008  Job:  [id number]

## 2010-10-07 NOTE — Discharge Summary (Signed)
NAMEALBIE, Jamie Nelson              ACCOUNT NO.:  [id number]   MEDICAL RECORD NO.:  [id number]        PATIENT TYPE:  WINP   LOCATION:                                [facility]ILITY:  [facility]   PHYSICIAN:  Jamie Colonel, MD   DATE OF BIRTH:  [birth year]   DATE OF ADMISSION:  10/18/2008  DATE OF DISCHARGE:  10/22/2008                               DISCHARGE SUMMARY   ADMITTING DIAGNOSIS:  38 Weeks 7 days with spontaneous rupture of  membranes, prior to labor onset.   DISCHARGE DIAGNOSES:  Postoperative day #3, status post cesarean section  for rest of active phase of labor, possible urinary tract infection.   PRENATAL COURSE:  The patient has received prenatal care at [facility] since 7 weeks' gestation with Jamie Jamie Nelson is primary.  The  patient is a primigravida.  Prenatal course has been uneventful.   PRENATAL LABS:  O positive, rubella positive, HIV negative, RPR  negative, hepatitis B negative, GBS negative.  Genetics screens  including an Ultra-Screen and AST are within normal limits.   ALLERGIES:  The patient has an allergy to SULFA and MACROBID.   CURRENT MEDICATIONS:  Prenatal vitamin one p.o. daily.   HOSPITAL PRESENTATION:  The patient presents at 38 weeks and 6 days with  spontaneous rupture of membranes with clear fluid since 0345 in the  morning of the same day.  NST is reactive with a baseline of 145 and no  fetal heart rate T cells.  There were no contractions after 8 hours of  rupture of membranes.  Cervix was 2 cm, 70%, vertex at a -2 station.  The patient is afebrile, vital signs are within normal limits.  CBC on  admission, white blood cell count of 10.4, hemoglobin 12.8, hematocrit  36.5, and a platelet count of 178.  Decision was made on admission to  admit and proceed with Pitocin augmentation.   INTRAPARTUM COURSE:  The patient received Pitocin augmentation of labor.  Cervical balloon to facilitate labor progression after onset of Pitocin.  The patient had  epidural for pain management.  The patient had arrest of  active phase of labor early in the morning of Oct 19, 2008.  Cervix 6  cm, 75%, vertex at a -3 station.  MOntevideo units consistently above  190 and Pitocin at 20 mL unit with no cervical progression.  Decision  was made to proceed with primary cesarean section.  Delivery on Oct 19, 2008 at 6:10 a.m., cesarean section by Dr. Ernestina Nelson, a female newborn.   POSTOPERATIVE COURSE:  The patient's postoperative course, the patient  remained afebrile during postoperative days.  On day of discharge, vital  signs 97.9, 87, 20, and 137/85.  Postoperative CBC, white blood cell  count 15.4, hemoglobin 9.5, hematocrit 27.6, and platelet count of 151.  On postoperative day #1, diagnosis of Down syndrome on newborn,  communicated to the patient's family and staff, unexplained of Down  syndrome, considering genetic screening was all normal.  The patient has  no known risk factors for genetics aberrations.  Social Work Librarian, academic and  breast feeding consult initiated.  The patient was up ad lib, tolerating  diet, complaints of dysuria on postoperative day #2.  Urine culture was  sent with positive E. Coli greater than 100,000 colonies reported.  The  patient treated with amoxicillin 500 mg t.i.d. x10 days.  The patient  was discharged to home on postoperative day #3.  Physical exam was  within normal limits.  The patient is appropriately and emotionally  upset at times and tearful with diagnosis of Down syndrome.  The patient  has great family support.  I discussed with the patient's baby blues  versus postpartum depression, signs to call.  Wound was healing  appropriately with staples intact.  There was mild erythema lateral to  the incision with a small amount of serous drainage.  Staples were  delayed discontinued until Thursday at [facility] for evaluation of wound  and possible developing seroma.  The patient currently on amoxicillin  for UTI, which  will also cover the wound.   DISCHARGE INSTRUCTIONS:  Per [facility] booklet.   MEDICATIONS AT THE TIME OF DISCHARGE:  1. Motrin over-the-counter 2-4 tablets every 8 hours as needed for      discomfort.  2. Percocet 1 tablet every 4-6 hours as needed for pain.  3. Amoxicillin 500 mg t.i.d. x10 days.  4. Prenatal vitamin one p.o. daily.   DIET:  Regular.   ACTIVITY:  Per will postoperative wound care.  The patient is to return  to [facility] on Thursday for staple removal and wound assessment,  to call for any increased drainage, any redness, or pain in the incision  prior to that appointment, to apply warm compresses to the edema above  the incision line for the next 2-3 days.      Jamie Jamie Nelson, C.N.M.      Jamie Colonel, MD  Electronically Signed    TB/MEDQ  D:  12/08/2008  T:  12/08/2008  Job:  [id number]

## 2010-11-20 ENCOUNTER — Encounter: Payer: Self-pay | Admitting: Internal Medicine

## 2011-06-17 ENCOUNTER — Telehealth: Payer: Self-pay | Admitting: *Deleted

## 2011-06-17 DIAGNOSIS — Z0000 Encounter for general adult medical examination without abnormal findings: Secondary | ICD-10-CM

## 2011-06-17 NOTE — Telephone Encounter (Signed)
Message copied by Deatra James on Wed Jun 17, 2011 10:00 AM ------      Message from: Earl Lagos      Created: Wed Jun 17, 2011  9:10 AM      Regarding: lab       Please schedule labs for cpx 06/29/11. Thanks

## 2011-06-17 NOTE — Telephone Encounter (Signed)
Received staff msg pt schedule cpx need cpx lab in EPIC.Marland KitchenMarland Kitchen1/23/13@10 :00am/LMB

## 2011-06-25 ENCOUNTER — Telehealth: Payer: Self-pay | Admitting: Internal Medicine

## 2011-06-25 NOTE — Telephone Encounter (Signed)
The pt is coming in for a cpe 2/4 and is planning on doing lab work tomorrow morning.  She has found out she is pregnant, and wants to make sure she can still get all the labs.  Thanks!

## 2011-06-25 NOTE — Telephone Encounter (Signed)
Called pt no answer LMOm md response..06/25/11@1 :32pm/LMB

## 2011-06-25 NOTE — Telephone Encounter (Signed)
Yes - all labs are fine - thanks

## 2011-06-26 ENCOUNTER — Other Ambulatory Visit (INDEPENDENT_AMBULATORY_CARE_PROVIDER_SITE_OTHER): Payer: 59

## 2011-06-26 DIAGNOSIS — Z0000 Encounter for general adult medical examination without abnormal findings: Secondary | ICD-10-CM

## 2011-06-26 LAB — HEPATIC FUNCTION PANEL
ALT: 27 U/L (ref 0–35)
AST: 19 U/L (ref 0–37)
Albumin: 4.1 g/dL (ref 3.5–5.2)
Alkaline Phosphatase: 67 U/L (ref 39–117)
Total Protein: 7.4 g/dL (ref 6.0–8.3)

## 2011-06-26 LAB — URINALYSIS, ROUTINE W REFLEX MICROSCOPIC
Bilirubin Urine: NEGATIVE
Leukocytes, UA: NEGATIVE
Nitrite: NEGATIVE
Specific Gravity, Urine: 1.015 (ref 1.000–1.030)
Total Protein, Urine: NEGATIVE
Urine Glucose: NEGATIVE
Urobilinogen, UA: 0.2 (ref 0.0–1.0)

## 2011-06-26 LAB — CBC WITH DIFFERENTIAL/PLATELET
Basophils Relative: 1.7 % (ref 0.0–3.0)
Eosinophils Relative: 1.4 % (ref 0.0–5.0)
Hemoglobin: 13.6 g/dL (ref 12.0–15.0)
Lymphocytes Relative: 31.8 % (ref 12.0–46.0)
MCHC: 34.2 g/dL (ref 30.0–36.0)
Monocytes Relative: 8.8 % (ref 3.0–12.0)
Neutro Abs: 4.2 10*3/uL (ref 1.4–7.7)
RBC: 4.39 Mil/uL (ref 3.87–5.11)

## 2011-06-26 LAB — LIPID PANEL: HDL: 56.2 mg/dL (ref >39.00)

## 2011-06-26 LAB — BASIC METABOLIC PANEL: Sodium: 136 mEq/L (ref 135–145)

## 2011-06-26 LAB — LDL CHOLESTEROL, DIRECT: Direct LDL: 132.6 mg/dL

## 2011-06-29 ENCOUNTER — Ambulatory Visit (INDEPENDENT_AMBULATORY_CARE_PROVIDER_SITE_OTHER): Payer: 59 | Admitting: Internal Medicine

## 2011-06-29 ENCOUNTER — Encounter: Payer: Self-pay | Admitting: Internal Medicine

## 2011-06-29 VITALS — BP 120/68 | HR 66 | Temp 98.0°F | Wt 177.0 lb

## 2011-06-29 DIAGNOSIS — Z0000 Encounter for general adult medical examination without abnormal findings: Secondary | ICD-10-CM

## 2011-06-29 NOTE — Patient Instructions (Signed)
It was good to see you today. We have reviewed your prior records including labs and tests today Health Maintenance reviewed - everything is up to date. Medications reviewed, no changes at this time.  Please schedule followup annually for physical and labs, call sooner if problems.

## 2011-06-29 NOTE — Progress Notes (Signed)
Subjective:    Patient ID: Jamie Nelson, female    DOB: [birth year], 31 y.o.   MRN: [id number]  HPI  patient is here today for annual physical. Patient feels well and has no complaints.  Past Medical History  Diagnosis Date  . ALLERGIC RHINITIS   . ANXIETY   . ARTHROSCOPY, KNEE, HX OF   . DEPRESSION   . HEPATIC CYST   . WISDOM TEETH EXTRACTION, HX OF    Family History  Problem Relation Age of Onset  . Breast cancer Other   . Colon cancer Other     parent, grandparent  . Diabetes Other     grandparent  . Hypertension Other     parent  . Lung cancer Other    History  Substance Use Topics  . Smoking status: Never Smoker   . Smokeless tobacco: Not on file   Comment: Married, lives with spouse [name] who is Artist of hospitalist service) and son [name] who has down symdrome  . Alcohol Use: Yes     occassional    Review of Systems Constitutional: Negative for fever or weight change.  Respiratory: Negative for cough and shortness of breath.   Cardiovascular: Negative for chest pain or palpitations.  Gastrointestinal: Negative for abdominal pain, no bowel changes.  Musculoskeletal: Negative for gait problem or joint swelling.  Skin: Negative for rash.  Neurological: Negative for dizziness or headache.  No other specific complaints in a complete review of systems (except as listed in HPI above).     Objective:   Physical Exam BP 120/68  Pulse 66  Temp(Src) 98 F (36.7 C) (Oral)  Wt 177 lb (80.287 kg)  SpO2 97% Wt Readings from Last 3 Encounters:  06/29/11 177 lb (80.287 kg)  06/16/10 202 lb (91.627 kg)  04/02/10 202 lb 9.6 oz (91.899 kg)   Constitutional: She appears well-developed and well-nourished. No distress.  HENT: Head: Normocephalic and atraumatic. Ears: B TMs ok, no erythema or effusion; Nose: Nose normal.  Mouth/Throat: Oropharynx is clear and moist. No oropharyngeal exudate.  Eyes: Conjunctivae and EOM are normal. Pupils are equal,  round, and reactive to light. No scleral icterus.  Neck: Normal range of motion. Neck supple. No JVD present. No thyromegaly present.  Cardiovascular: Normal rate, regular rhythm and normal heart sounds.  No murmur heard. No BLE edema. Pulmonary/Chest: Effort normal and breath sounds normal. No respiratory distress. She has no wheezes.  Abdominal: Soft. Bowel sounds are normal. She exhibits no distension. There is no tenderness. no masses Musculoskeletal: Normal range of motion, no joint effusions. No gross deformities Neurological: She is alert and oriented to person, place, and time. No cranial nerve deficit. Coordination normal.  Skin: Skin is warm and dry. No rash noted. No erythema.  Psychiatric: She has a normal mood and affect. Her behavior is normal. Judgment and thought content normal.   Lab Results  Component Value Date   WBC 7.5 06/26/2011   HGB 13.6 06/26/2011   HCT 39.9 06/26/2011   PLT 196.0 06/26/2011   GLUCOSE 83 06/26/2011   CHOL 204* 06/26/2011   TRIG 75.0 06/26/2011   HDL 56.20 06/26/2011   LDLDIRECT 132.6 06/26/2011   ALT 27 06/26/2011   AST 19 06/26/2011   NA 136 06/26/2011   K 4.3 06/26/2011   CL 104 06/26/2011   CREATININE 0.7 06/26/2011   BUN 16 06/26/2011   CO2 25 06/26/2011   TSH 1.22 06/26/2011         Assessment &  Plan:  CPX - v70.0 - Patient has been counseled on age-appropriate routine health concerns for screening and prevention. These are reviewed and up-to-date. Immunizations are up-to-date or declined. Labs  reviewed.

## 2011-07-22 LAB — OB RESULTS CONSOLE HIV ANTIBODY (ROUTINE TESTING): HIV: NONREACTIVE

## 2011-07-22 LAB — OB RESULTS CONSOLE RUBELLA ANTIBODY, IGM: Rubella: IMMUNE

## 2011-12-11 LAB — OB RESULTS CONSOLE RPR: RPR: NONREACTIVE

## 2012-02-19 ENCOUNTER — Encounter (HOSPITAL_COMMUNITY): Payer: Self-pay | Admitting: *Deleted

## 2012-02-19 ENCOUNTER — Inpatient Hospital Stay (HOSPITAL_COMMUNITY)
Admission: AD | Admit: 2012-02-19 | Discharge: 2012-02-19 | Disposition: A | Discharge status: Home / Self Care | Payer: 59 | Source: Ambulatory Visit | Attending: Obstetrics and Gynecology | Admitting: Obstetrics and Gynecology

## 2012-02-19 DIAGNOSIS — N898 Other specified noninflammatory disorders of vagina: Secondary | ICD-10-CM

## 2012-02-19 DIAGNOSIS — O99891 Other specified diseases and conditions complicating pregnancy: Secondary | ICD-10-CM | POA: Insufficient documentation

## 2012-02-19 NOTE — MAU Provider Note (Signed)
History   Jamie Nelson is a 31 y.o. G2P1001 at [redacted]w[redacted]d here w/ c/o gush of fluid from vagina last nigh. No further fluid leaking since then, occasional contractions and some mild back pain present.  Reports good fetal movement, no VB. Denies urinary S/S. Hx C-section for FTP, plans TOLAC if spont labor.  Chief Complaint  Patient presents with  . Rupture of Membranes     OB History    Grav Para Term Preterm Abortions TAB SAB Ect Mult Living   2 1 1       1       Past Medical History  Diagnosis Date  . ALLERGIC RHINITIS   . ANXIETY   . ARTHROSCOPY, KNEE, HX OF   . DEPRESSION   . HEPATIC CYST   . WISDOM TEETH EXTRACTION, HX OF     Past Surgical History  Procedure Date  . Cesarean section 09/2008  . Benign liver cyst removed 1988  . Knee arthroscopy   . Wisdom teeth extraction 2004    Family History  Problem Relation Age of Onset  . Breast cancer Other   . Colon cancer Other     parent, grandparent  . Diabetes Other     grandparent  . Hypertension Other     parent  . Lung cancer Other   . Cancer Maternal Aunt     Breast  . Cancer Maternal Grandmother     Lung  . Stroke Paternal Grandmother   . Stroke Paternal Grandfather     History  Substance Use Topics  . Smoking status: Never Smoker   . Smokeless tobacco: Not on file   Comment: Married, lives with spouse Jamie Nelson who is Artist of hospitalist service) and son Jamie Nelson who has down symdrome  . Alcohol Use: Yes     occassional    Allergies:  Allergies  Allergen Reactions  . Nitrofurantoin     REACTION: Hives    Prescriptions prior to admission  Medication Sig Dispense Refill  . Cetirizine HCl (ZYRTEC PO) Take 10 mg by mouth daily.       . Prenatal MV-Min-Fe Fum-FA-DHA (PRENATAL 1 PO) Take by mouth daily.         ROS as noted above Physical Exam     Physical Exam: Blood pressure 117/78, temperature 98.2 F (36.8 C), resp. rate 18. General: NAD Heart: RRR, no murmurs Lungs: CTA b/l    Abd: Soft, NT, EFW 8  Ext: no edema Neuro: DTRs normal Other: pelvic - no visible LOF, cvx 1.5 / 80 / -2, vertex ballottable / settling into pelvis  EFM - FHR 130 BPM, moderate variability, no decel's, + accel's to 150            TOCO: occasional ctx, mild irritability    ED Course   Results for orders placed during the hospital encounter of 02/19/12 (from the past 24 hour(s))  AMNISURE RUPTURE OF MEMBRANE (ROM)     Status: Normal   Collection Time   02/19/12  6:58 AM      Component Value Range   Amnisure ROM NEGATIVE       A/P: IUP at term, hx of C/S x1 Amniotic membranes intact, no labor.  Pt DCe'd home w/ instructions F/U in office as scheduled. Labor precautions and [facility] reviewed.  PAUL,DANIELA  02/19/2012 7:40 AM

## 2012-02-19 NOTE — MAU Note (Signed)
Water broke around 8pm. Some cramping no contractions

## 2012-02-19 NOTE — MAU Note (Signed)
Pt has negative amnisure, ice water given. Awaiting reactive efm tracing.

## 2012-02-23 ENCOUNTER — Other Ambulatory Visit: Payer: Self-pay | Admitting: Obstetrics

## 2012-02-23 ENCOUNTER — Encounter (HOSPITAL_COMMUNITY): Payer: Self-pay | Admitting: Pharmacist

## 2012-02-24 ENCOUNTER — Encounter (HOSPITAL_COMMUNITY): Payer: Self-pay

## 2012-02-24 ENCOUNTER — Encounter (HOSPITAL_COMMUNITY)
Admission: RE | Admit: 2012-02-24 | Discharge: 2012-02-24 | Disposition: A | Discharge status: Home / Self Care | Payer: 59 | Source: Ambulatory Visit | Attending: Obstetrics | Admitting: Obstetrics

## 2012-02-24 HISTORY — DX: Other seasonal allergic rhinitis: J30.2

## 2012-02-24 LAB — CBC
HCT: 38.7 % (ref 36.0–46.0)
Hemoglobin: 12.7 g/dL (ref 12.0–15.0)
RBC: 4.46 MIL/uL (ref 3.87–5.11)
WBC: 10.6 10*3/uL — ABNORMAL HIGH (ref 4.0–10.5)

## 2012-02-24 LAB — ABO/RH: ABO/RH(D): O POS

## 2012-02-24 LAB — SURGICAL PCR SCREEN
MRSA, PCR: NEGATIVE
Staphylococcus aureus: NEGATIVE

## 2012-02-24 LAB — TYPE AND SCREEN

## 2012-02-24 NOTE — Patient Instructions (Addendum)
   Your procedure is scheduled on: Friday, Oct 4  Enter through the [facility] at: 1015 am Pick up the phone at the desk and dial [phone] and inform us of your arrival.  Please call this number if you have any problems the morning of surgery: [phone]  Remember: Do not eat food after midnight: Thursday Do not drink clear liquids after: Thursday Take these medicines the morning of surgery with a SIP OF WATER: None  Do not wear jewelry, make-up, or FINGER nail polish No metal in your hair or on your body. Do not wear lotions, powders, perfumes. You may wear deodorant.  Please use your CHG wash as directed prior to surgery.  Do not shave anywhere for at least 12 hours prior to first CHG shower.  Do not bring valuables to the hospital. Contacts, dentures or bridgework may not be worn into surgery.  Leave suitcase in the car. After Surgery it may be brought to your room. For patients being admitted to the hospital, checkout time is 11:00am the day of discharge. Home with husband Jamie Nelson cell [phone].  Patients discharged on the day of surgery will not be allowed to drive home.

## 2012-02-26 ENCOUNTER — Encounter (HOSPITAL_COMMUNITY): Payer: Self-pay | Admitting: Anesthesiology

## 2012-02-26 ENCOUNTER — Inpatient Hospital Stay (HOSPITAL_COMMUNITY)
Admission: RE | Admit: 2012-02-26 | Discharge: 2012-02-28 | DRG: 766 | Disposition: A | Discharge status: Home / Self Care | Payer: 59 | Source: Ambulatory Visit | Attending: Obstetrics | Admitting: Obstetrics

## 2012-02-26 ENCOUNTER — Encounter (HOSPITAL_COMMUNITY): Payer: Self-pay | Admitting: *Deleted

## 2012-02-26 ENCOUNTER — Encounter (HOSPITAL_COMMUNITY)
Admission: RE | Disposition: A | Discharge status: Home / Self Care | Payer: Self-pay | Source: Ambulatory Visit | Attending: Obstetrics

## 2012-02-26 ENCOUNTER — Inpatient Hospital Stay (HOSPITAL_COMMUNITY): Payer: 59 | Admitting: Anesthesiology

## 2012-02-26 DIAGNOSIS — O34219 Maternal care for unspecified type scar from previous cesarean delivery: Principal | ICD-10-CM | POA: Diagnosis present

## 2012-02-26 DIAGNOSIS — Z98891 History of uterine scar from previous surgery: Secondary | ICD-10-CM

## 2012-02-26 DIAGNOSIS — Z01812 Encounter for preprocedural laboratory examination: Secondary | ICD-10-CM

## 2012-02-26 DIAGNOSIS — Z01818 Encounter for other preprocedural examination: Secondary | ICD-10-CM

## 2012-02-26 HISTORY — DX: History of uterine scar from previous surgery: Z98.891

## 2012-02-26 LAB — URINALYSIS, ROUTINE W REFLEX MICROSCOPIC
Ketones, ur: NEGATIVE mg/dL
Leukocytes, UA: NEGATIVE
Nitrite: NEGATIVE
Protein, ur: NEGATIVE mg/dL
Urobilinogen, UA: 0.2 mg/dL (ref 0.0–1.0)
pH: 6 (ref 5.0–8.0)

## 2012-02-26 SURGERY — Surgical Case
Anesthesia: Spinal | Site: Abdomen | Wound class: Clean Contaminated

## 2012-02-26 MED ORDER — FENTANYL CITRATE 0.05 MG/ML IJ SOLN
INTRAMUSCULAR | Status: DC | PRN
  Administered 2012-02-26 (×3): 25 ug via INTRAVENOUS
  Administered 2012-02-26: 25 ug via INTRATHECAL

## 2012-02-26 MED ORDER — ONDANSETRON HCL 4 MG/2ML IJ SOLN
INTRAMUSCULAR | Status: AC
  Filled 2012-02-26: qty 2

## 2012-02-26 MED ORDER — MORPHINE SULFATE (PF) 0.5 MG/ML IJ SOLN
INTRAMUSCULAR | Status: DC | PRN
  Administered 2012-02-26: .15 mg via EPIDURAL

## 2012-02-26 MED ORDER — METHYLERGONOVINE MALEATE 0.2 MG/ML IJ SOLN
INTRAMUSCULAR | Status: DC | PRN
  Administered 2012-02-26: 0.2 mg via INTRAMUSCULAR

## 2012-02-26 MED ORDER — CEFAZOLIN SODIUM-DEXTROSE 2-3 GM-% IV SOLR: 2.0000 g | INTRAVENOUS | Status: DC

## 2012-02-26 MED ORDER — SIMETHICONE 80 MG PO CHEW
80.0000 mg | CHEWABLE_TABLET | Freq: Three times a day (TID) | ORAL | Status: DC
  Administered 2012-02-26 – 2012-02-28 (×6): 80 mg via ORAL

## 2012-02-26 MED ORDER — FENTANYL CITRATE 0.05 MG/ML IJ SOLN
INTRAMUSCULAR | Status: AC
  Filled 2012-02-26: qty 2

## 2012-02-26 MED ORDER — KETOROLAC TROMETHAMINE 60 MG/2ML IM SOLN
INTRAMUSCULAR | Status: AC
  Filled 2012-02-26: qty 2

## 2012-02-26 MED ORDER — MENTHOL 3 MG MT LOZG: 1.0000 | LOZENGE | OROMUCOSAL | Status: DC | PRN

## 2012-02-26 MED ORDER — IBUPROFEN 600 MG PO TABS
600.0000 mg | ORAL_TABLET | Freq: Four times a day (QID) | ORAL | Status: DC | PRN
  Filled 2012-02-26 (×5): qty 1

## 2012-02-26 MED ORDER — OXYTOCIN 40 UNITS IN LACTATED RINGERS INFUSION - SIMPLE MED: 62.5000 mL/h | INTRAVENOUS | Status: AC

## 2012-02-26 MED ORDER — DIPHENHYDRAMINE HCL 50 MG/ML IJ SOLN: 25.0000 mg | INTRAMUSCULAR | Status: DC | PRN

## 2012-02-26 MED ORDER — EPHEDRINE 5 MG/ML INJ
INTRAVENOUS | Status: AC
  Filled 2012-02-26: qty 10

## 2012-02-26 MED ORDER — NALBUPHINE HCL 10 MG/ML IJ SOLN
5.0000 mg | INTRAMUSCULAR | Status: DC | PRN
  Filled 2012-02-26: qty 1

## 2012-02-26 MED ORDER — SODIUM CHLORIDE 0.9 % IV SOLN
1.0000 ug/kg/h | INTRAVENOUS | Status: DC | PRN
  Filled 2012-02-26: qty 2.5

## 2012-02-26 MED ORDER — OXYTOCIN 10 UNIT/ML IJ SOLN
40.0000 [IU] | INTRAVENOUS | Status: DC | PRN
  Administered 2012-02-26: 40 [IU] via INTRAVENOUS

## 2012-02-26 MED ORDER — NALBUPHINE SYRINGE 5 MG/0.5 ML
INJECTION | INTRAMUSCULAR | Status: AC
  Administered 2012-02-26: 10 mg via SUBCUTANEOUS
  Filled 2012-02-26: qty 0.5

## 2012-02-26 MED ORDER — NALOXONE HCL 0.4 MG/ML IJ SOLN: 0.4000 mg | INTRAMUSCULAR | Status: DC | PRN

## 2012-02-26 MED ORDER — NALBUPHINE HCL 10 MG/ML IJ SOLN
5.0000 mg | INTRAMUSCULAR | Status: DC | PRN
  Administered 2012-02-26: 5 mg via SUBCUTANEOUS
  Administered 2012-02-26: 10 mg via SUBCUTANEOUS
  Filled 2012-02-26 (×2): qty 1

## 2012-02-26 MED ORDER — ONDANSETRON HCL 4 MG/2ML IJ SOLN: 4.0000 mg | INTRAMUSCULAR | Status: DC | PRN

## 2012-02-26 MED ORDER — OXYTOCIN 10 UNIT/ML IJ SOLN
INTRAMUSCULAR | Status: AC
  Filled 2012-02-26: qty 4

## 2012-02-26 MED ORDER — ONDANSETRON HCL 4 MG/2ML IJ SOLN
INTRAMUSCULAR | Status: DC | PRN
  Administered 2012-02-26: 4 mg via INTRAVENOUS

## 2012-02-26 MED ORDER — SODIUM CHLORIDE 0.9 % IJ SOLN: 3.0000 mL | INTRAMUSCULAR | Status: DC | PRN

## 2012-02-26 MED ORDER — SIMETHICONE 80 MG PO CHEW: 80.0000 mg | CHEWABLE_TABLET | ORAL | Status: DC | PRN

## 2012-02-26 MED ORDER — CEFAZOLIN SODIUM-DEXTROSE 2-3 GM-% IV SOLR
INTRAVENOUS | Status: AC
  Administered 2012-02-26: 2 g via INTRAVENOUS
  Filled 2012-02-26: qty 50

## 2012-02-26 MED ORDER — ONDANSETRON HCL 4 MG PO TABS: 4.0000 mg | ORAL_TABLET | ORAL | Status: DC | PRN

## 2012-02-26 MED ORDER — METOCLOPRAMIDE HCL 5 MG/ML IJ SOLN: 10.0000 mg | Freq: Three times a day (TID) | INTRAMUSCULAR | Status: DC | PRN

## 2012-02-26 MED ORDER — SCOPOLAMINE 1 MG/3DAYS TD PT72
MEDICATED_PATCH | TRANSDERMAL | Status: AC
  Filled 2012-02-26: qty 1

## 2012-02-26 MED ORDER — LACTATED RINGERS IV SOLN
INTRAVENOUS | Status: DC
  Administered 2012-02-26 (×3): via INTRAVENOUS

## 2012-02-26 MED ORDER — DIPHENHYDRAMINE HCL 50 MG/ML IJ SOLN: 12.5000 mg | INTRAMUSCULAR | Status: DC | PRN

## 2012-02-26 MED ORDER — SCOPOLAMINE 1 MG/3DAYS TD PT72
1.0000 | MEDICATED_PATCH | Freq: Once | TRANSDERMAL | Status: DC
  Filled 2012-02-26: qty 1

## 2012-02-26 MED ORDER — KETOROLAC TROMETHAMINE 30 MG/ML IJ SOLN: 30.0000 mg | Freq: Four times a day (QID) | INTRAMUSCULAR | Status: AC | PRN

## 2012-02-26 MED ORDER — ONDANSETRON HCL 4 MG/2ML IJ SOLN: 4.0000 mg | Freq: Three times a day (TID) | INTRAMUSCULAR | Status: DC | PRN

## 2012-02-26 MED ORDER — IBUPROFEN 600 MG PO TABS
600.0000 mg | ORAL_TABLET | Freq: Four times a day (QID) | ORAL | Status: DC
  Administered 2012-02-26 – 2012-02-28 (×7): 600 mg via ORAL
  Filled 2012-02-26 (×2): qty 1

## 2012-02-26 MED ORDER — PHENYLEPHRINE 40 MCG/ML (10ML) SYRINGE FOR IV PUSH (FOR BLOOD PRESSURE SUPPORT)
PREFILLED_SYRINGE | INTRAVENOUS | Status: AC
Start: 2012-02-26 — End: 2012-02-26
  Filled 2012-02-26: qty 5

## 2012-02-26 MED ORDER — WITCH HAZEL-GLYCERIN EX PADS: 1.0000 "application " | MEDICATED_PAD | CUTANEOUS | Status: DC | PRN

## 2012-02-26 MED ORDER — DIPHENHYDRAMINE HCL 25 MG PO CAPS: 25.0000 mg | ORAL_CAPSULE | Freq: Four times a day (QID) | ORAL | Status: DC | PRN

## 2012-02-26 MED ORDER — PHENYLEPHRINE 40 MCG/ML (10ML) SYRINGE FOR IV PUSH (FOR BLOOD PRESSURE SUPPORT)
PREFILLED_SYRINGE | INTRAVENOUS | Status: AC
  Filled 2012-02-26: qty 5

## 2012-02-26 MED ORDER — PRENATAL MULTIVITAMIN CH
1.0000 | ORAL_TABLET | Freq: Every day | ORAL | Status: DC
  Administered 2012-02-26 – 2012-02-28 (×3): 1 via ORAL
  Filled 2012-02-26 (×3): qty 1

## 2012-02-26 MED ORDER — MEPERIDINE HCL 25 MG/ML IJ SOLN: 6.2500 mg | INTRAMUSCULAR | Status: DC | PRN

## 2012-02-26 MED ORDER — METHYLERGONOVINE MALEATE 0.2 MG/ML IJ SOLN
INTRAMUSCULAR | Status: AC
  Filled 2012-02-26: qty 1

## 2012-02-26 MED ORDER — PHENYLEPHRINE HCL 10 MG/ML IJ SOLN
INTRAMUSCULAR | Status: DC | PRN
  Administered 2012-02-26 (×5): 40 ug via INTRAVENOUS

## 2012-02-26 MED ORDER — TETANUS-DIPHTH-ACELL PERTUSSIS 5-2.5-18.5 LF-MCG/0.5 IM SUSP: 0.5000 mL | Freq: Once | INTRAMUSCULAR | Status: DC

## 2012-02-26 MED ORDER — SENNOSIDES-DOCUSATE SODIUM 8.6-50 MG PO TABS
2.0000 | ORAL_TABLET | Freq: Every day | ORAL | Status: DC
  Administered 2012-02-26 – 2012-02-27 (×2): 2 via ORAL

## 2012-02-26 MED ORDER — DIPHENHYDRAMINE HCL 25 MG PO CAPS: 25.0000 mg | ORAL_CAPSULE | ORAL | Status: DC | PRN

## 2012-02-26 MED ORDER — LANOLIN HYDROUS EX OINT: 1.0000 "application " | TOPICAL_OINTMENT | CUTANEOUS | Status: DC | PRN

## 2012-02-26 MED ORDER — HYDROMORPHONE HCL PF 1 MG/ML IJ SOLN: 0.2500 mg | INTRAMUSCULAR | Status: DC | PRN

## 2012-02-26 MED ORDER — ZOLPIDEM TARTRATE 5 MG PO TABS: 5.0000 mg | ORAL_TABLET | Freq: Every evening | ORAL | Status: DC | PRN

## 2012-02-26 MED ORDER — DIBUCAINE 1 % RE OINT: 1.0000 "application " | TOPICAL_OINTMENT | RECTAL | Status: DC | PRN

## 2012-02-26 MED ORDER — EPHEDRINE SULFATE 50 MG/ML IJ SOLN
INTRAMUSCULAR | Status: DC | PRN
  Administered 2012-02-26: 20 mg via INTRAVENOUS
  Administered 2012-02-26 (×2): 10 mg via INTRAVENOUS
  Administered 2012-02-26: 20 mg via INTRAVENOUS

## 2012-02-26 MED ORDER — OXYCODONE-ACETAMINOPHEN 5-325 MG PO TABS
1.0000 | ORAL_TABLET | ORAL | Status: DC | PRN
  Administered 2012-02-27: 1 via ORAL
  Administered 2012-02-27: 2 via ORAL
  Administered 2012-02-27 – 2012-02-28 (×5): 1 via ORAL
  Filled 2012-02-26: qty 1
  Filled 2012-02-26: qty 2
  Filled 2012-02-26 (×5): qty 1

## 2012-02-26 MED ORDER — KETOROLAC TROMETHAMINE 60 MG/2ML IM SOLN
60.0000 mg | Freq: Once | INTRAMUSCULAR | Status: AC | PRN
  Administered 2012-02-26: 60 mg via INTRAMUSCULAR

## 2012-02-26 MED ORDER — LACTATED RINGERS IV SOLN
INTRAVENOUS | Status: DC
  Administered 2012-02-26: 19:00:00 via INTRAVENOUS

## 2012-02-26 MED ORDER — MORPHINE SULFATE 0.5 MG/ML IJ SOLN
INTRAMUSCULAR | Status: AC
  Filled 2012-02-26: qty 10

## 2012-02-26 MED ORDER — SCOPOLAMINE 1 MG/3DAYS TD PT72
1.0000 | MEDICATED_PATCH | Freq: Once | TRANSDERMAL | Status: DC
  Administered 2012-02-26: 1.5 mg via TRANSDERMAL

## 2012-02-26 MED ORDER — LACTATED RINGERS IV SOLN
INTRAVENOUS | Status: DC | PRN
  Administered 2012-02-26: 13:00:00 via INTRAVENOUS

## 2012-02-26 SURGICAL SUPPLY — 33 items
CLOTH BEACON ORANGE TIMEOUT ST (SAFETY) ×2 IMPLANT
CONTAINER PREFILL 10% NBF 15ML (MISCELLANEOUS) IMPLANT
DRAPE SURG 17X23 STRL (DRAPES) ×2 IMPLANT
DRSG COVADERM 4X10 (GAUZE/BANDAGES/DRESSINGS) ×2 IMPLANT
DURAPREP 26ML APPLICATOR (WOUND CARE) ×2 IMPLANT
ELECT REM PT RETURN 9FT ADLT (ELECTROSURGICAL) ×2
ELECTRODE REM PT RTRN 9FT ADLT (ELECTROSURGICAL) ×1 IMPLANT
EXTRACTOR VACUUM KIWI (MISCELLANEOUS) IMPLANT
EXTRACTOR VACUUM M CUP 4 TUBE (SUCTIONS) IMPLANT
GLOVE BIO SURGEON STRL SZ 6.5 (GLOVE) ×2 IMPLANT
GLOVE BIOGEL PI IND STRL 7.0 (GLOVE) ×2 IMPLANT
GLOVE BIOGEL PI INDICATOR 7.0 (GLOVE) ×2
GOWN PREVENTION PLUS LG XLONG (DISPOSABLE) ×6 IMPLANT
KIT ABG SYR 3ML LUER SLIP (SYRINGE) IMPLANT
NDL HYPO 25X5/8 SAFETYGLIDE (NEEDLE) IMPLANT
NEEDLE HYPO 25X5/8 SAFETYGLIDE (NEEDLE) IMPLANT
NS IRRIG 1000ML POUR BTL (IV SOLUTION) ×2 IMPLANT
PACK C SECTION WH (CUSTOM PROCEDURE TRAY) ×2 IMPLANT
PAD OB MATERNITY 4.3X12.25 (PERSONAL CARE ITEMS) ×1 IMPLANT
SLEEVE SCD COMPRESS KNEE MED (MISCELLANEOUS) ×1 IMPLANT
STAPLER VISISTAT 35W (STAPLE) IMPLANT
STRIP CLOSURE SKIN 1/2X4 (GAUZE/BANDAGES/DRESSINGS) IMPLANT
SUT MON AB 4-0 PS1 27 (SUTURE) ×1 IMPLANT
SUT PLAIN 0 NONE (SUTURE) IMPLANT
SUT PLAIN 2 0 XLH (SUTURE) ×1 IMPLANT
SUT VIC AB 0 CT1 36 (SUTURE) ×2 IMPLANT
SUT VIC AB 0 CTX 36 (SUTURE) ×6
SUT VIC AB 0 CTX36XBRD ANBCTRL (SUTURE) ×3 IMPLANT
SUT VIC AB 2-0 CT1 27 (SUTURE) ×2
SUT VIC AB 2-0 CT1 TAPERPNT 27 (SUTURE) ×1 IMPLANT
TOWEL OR 17X24 6PK STRL BLUE (TOWEL DISPOSABLE) ×4 IMPLANT
TRAY FOLEY CATH 14FR (SET/KITS/TRAYS/PACK) IMPLANT
WATER STERILE IRR 1000ML POUR (IV SOLUTION) ×2 IMPLANT

## 2012-02-26 NOTE — Op Note (Signed)
02/26/2012  1:40 PM  PATIENT:  Jamie Nelson  31 y.o. female  PRE-OPERATIVE DIAGNOSIS:  Previous Cesarean Section  POST-OPERATIVE DIAGNOSIS:  Previous Cesarean Section  PROCEDURE:  Procedure(s) (LRB) with comments: CESAREAN SECTION (N/A) - EDD: 02/27/12, repeat cesarean section with 2 layer closure, vacuum assistance for delivery of the fetal occiput  SURGEON:  Surgeon(s) and Role:    * Sebrina Kessner A. Ernestina Penna, MD - Primary  PHYSICIAN ASSISTANT:   ASSISTANTS: Arlan Organ, CNM   ANESTHESIA:   spinal  EBL:  Total I/O In: 3000 [I.V.:3000] Out: 1150 [Urine:350; Blood:800]  BLOOD ADMINISTERED:none  DRAINS: Urinary Catheter (Foley)   LOCAL MEDICATIONS USED:  NONE  SPECIMEN:  Source of Specimen:  Placenta  DISPOSITION OF SPECIMEN:  Labor and delivery  COUNTS:  YES  TOURNIQUET:  * No tourniquets in log *  DICTATION: .Note written in EPIC  PLAN OF CARE: Admit to inpatient   PATIENT DISPOSITION:  PACU - hemodynamically stable.   Delay start of Pharmacological VTE agent (>24hrs) due to surgical blood loss or risk of bleeding: yes   Findings:  @BABYSEXEBC @ infant,  APGAR (1 MIN): 6   APGAR (5 MINS): 8   APGAR (10 MINS):    Normal uterus tubes and ovaries, normal placenta, three-vessel cord, copious clear amniotic fluid. EBL: 800 cc Antibiotics:  2 g Ancef  Complications: none  Indications: This is a 31 y.o. year-old, G2P1  At [redacted]w[redacted]d admitted for repeat cesarean section. Risks benefits and alternatives of the procedure were discussed with the patient who agreed to proceed  Procedure:  After informed consent was obtained the patient was taken to the operating room where spinal anesthesia was initiated.  She was prepped and draped in the normal sterile fashion in dorsal supine position with a leftward tilt.  A foley catheter was inserted sterilely into the bladder . Gloves were changes and attention was turned to the patient's abdomen. A Pfannenstiel skin incision was made 2  cm above the pubic symphysis in the midline with the scalpel.  Dissection was carried down with the Bovie cautery until the fascia was reached. The fascia was incised in the midline. The incision was extended laterally with the Mayo scissors. The inferior aspect of the fascial incision was grasped with the Coker clamps, elevated up and the underlying rectus muscles were dissected off sharply. The superior aspect of the fascial incision was grasped with the Coker clamps elevated up and the underlying rectus muscles were dissected off sharply.  The peritoneum was entered sharply. The peritoneal incision was extended superiorly and inferiorly with good visualization of the bladder. The bladder blade was inserted, the vesicouterine peritoneum was identified grasped with the pickups and entered sharply. The bladder flap was created digitally the bladder blade was reinserted. Palpation was done to assess the fetal position and the location of the uterine vessels. The lower segment of the uterus was incised sharply with the scalpel and extended superiorly and laterally with the bandage scissors. The infant also was grasped brought to the incision but the fetal vertex continued to rotate. After 1 minute of attempts to deliver the fetal vertex the decision was made to place a vacuum on the fetal vertex. This was done but due to poor suction to an initial pop offs and eventually with slow and steady pressure the vertex was delivered. Nuchal cord x1 was reduced and the remainder of the infant was delivered without complication. the nose and mouth were bulb suctioned. The cord was clamped and cut. The  infant was handed off to the waiting pediatrician. The placenta was expressed. The uterus was exteriorized. The uterus was cleared of all clots and debris. The uterine incision was repaired with 0 Vicryl in a running locked fashion.  A second layer of the same suture was used in an imbricating fashion to obtain excellent  hemostasis.  The uterus was then returned to the abdomen, the gutters were cleared of all clots and debris. The uterine incision was reinspected and found to be hemostatic. The peritoneum was grasped and closed with 2-0 Vicryl in a running fashion. The cut muscle edges and the underside of the fascia were inspected and found to be hemostatic. The fascia was closed with 0 Vicryl in two halves. The subcutaneous tissue was irrigated. Scarpa's layer was closed with a 2-0 plain gut suture. The skin was closed with a 4-0 Monocryl in a single layer. The patient tolerated the procedure well. Sponge lap and needle counts were correct x3 and patient was taken to the recovery room in a stable condition.  Lesslie Mckeehan A. 02/26/2012 1:41 PM

## 2012-02-26 NOTE — Anesthesia Postprocedure Evaluation (Signed)
  Anesthesia Post-op Note  Patient: Jamie Nelson  Procedure(s) Performed: Procedure(s) (LRB) with comments: CESAREAN SECTION (N/A) - EDD: 02/27/12  Patient is awake, responsive, moving her legs, and has signs of resolution of her numbness. Pain and nausea are reasonably well controlled. Vital signs are stable and clinically acceptable. Oxygen saturation is clinically acceptable. There are no apparent anesthetic complications at this time. Patient is ready for discharge.

## 2012-02-26 NOTE — Brief Op Note (Signed)
02/26/2012  1:40 PM  PATIENT:  Jamie Nelson  31 y.o. female  PRE-OPERATIVE DIAGNOSIS:  Previous Cesarean Section  POST-OPERATIVE DIAGNOSIS:  Previous Cesarean Section  PROCEDURE:  Procedure(s) (LRB) with comments: CESAREAN SECTION (N/A) - EDD: 02/27/12, repeat cesarean section with 2 layer closure  SURGEON:  Surgeon(s) and Role:    * Arian Mcquitty A. Ernestina Penna, MD - Primary  PHYSICIAN ASSISTANT:   ASSISTANTS: Arlan Organ, CNM   ANESTHESIA:   spinal  EBL:  Total I/O In: 3000 [I.V.:3000] Out: 1150 [Urine:350; Blood:800]  BLOOD ADMINISTERED:none  DRAINS: Urinary Catheter (Foley)   LOCAL MEDICATIONS USED:  NONE  SPECIMEN:  Source of Specimen:  Placenta  DISPOSITION OF SPECIMEN:  Labor and delivery  COUNTS:  YES  TOURNIQUET:  * No tourniquets in log *  DICTATION: .Note written in EPIC  PLAN OF CARE: Admit to inpatient   PATIENT DISPOSITION:  PACU - hemodynamically stable.   Delay start of Pharmacological VTE agent (>24hrs) due to surgical blood loss or risk of bleeding: yes

## 2012-02-26 NOTE — Addendum Note (Signed)
Addendum  created 02/26/12 1806 by Graciela Husbands, CRNA   Modules edited:Notes Section

## 2012-02-26 NOTE — Transfer of Care (Signed)
Immediate Anesthesia Transfer of Care Note  Patient: Jamie Nelson  Procedure(s) Performed: Procedure(s) (LRB) with comments: CESAREAN SECTION (N/A) - EDD: 02/27/12  Patient Location: PACU  Anesthesia Type: Spinal  Level of Consciousness: awake, alert  and oriented  Airway & Oxygen Therapy: Patient Spontanous Breathing  Post-op Assessment: Report given to PACU RN  Post vital signs: Reviewed and stable  Complications: No apparent anesthesia complications

## 2012-02-26 NOTE — Progress Notes (Signed)
Phone call to Dr Cristela Blue re: Toradol 60 mg IM ordered with plt count <150.  Okay to give Toradol as ordered.

## 2012-02-26 NOTE — Anesthesia Preprocedure Evaluation (Signed)
Anesthesia Evaluation  Patient identified by MRN, date of birth, ID band Patient awake    Reviewed: Allergy & Precautions, H&P , Patient's Chart, lab work & pertinent test results  Airway Mallampati: II TM Distance: >3 FB Neck ROM: full    Dental No notable dental hx.    Pulmonary  breath sounds clear to auscultation  Pulmonary exam normal       Cardiovascular Exercise Tolerance: Good Rhythm:regular Rate:Normal     Neuro/Psych    GI/Hepatic   Endo/Other    Renal/GU      Musculoskeletal   Abdominal   Peds  Hematology   Anesthesia Other Findings   Reproductive/Obstetrics                           Anesthesia Physical Anesthesia Plan  ASA: III  Anesthesia Plan: Spinal   Post-op Pain Management:    Induction:   Airway Management Planned:   Additional Equipment:   Intra-op Plan:   Post-operative Plan:   Informed Consent: I have reviewed the patients History and Physical, chart, labs and discussed the procedure including the risks, benefits and alternatives for the proposed anesthesia with the patient or authorized representative who has indicated his/her understanding and acceptance.   Dental Advisory Given  Plan Discussed with: CRNA  Anesthesia Plan Comments: (Lab work confirmed with CRNA in room. Platelets okay. Discussed spinal anesthetic, and patient consents to the procedure:  included risk of possible headache,backache, failed block, allergic reaction, and nerve injury. This patient was asked if she had any questions or concerns before the procedure started. )        Anesthesia Quick Evaluation  

## 2012-02-26 NOTE — H&P (Signed)
Jamie Nelson is a 31 y.o. female presenting for repeat cesarean delivery.   Prenatal care at [facility] since 8.[redacted] weeks gestation, primary T. Fredric Mare, CNM.   Cesarean delivery with 1st pregnancy due to arrest of labor - baby weighed 6lbs 11oz. Was considering TOLAC previously - now desires repeat cesarean delivery due to  EFW = 9lbs / Vertex not engaged / cervix unfavorable for IOL. Good fetal movement, no LOF, no VB.  Maternal Medical History:  Reason for admission: Reason for Admission:   nausea  OB History    Grav Para Term Preterm Abortions TAB SAB Ect Mult Living   2 1 1       1      Past Medical History  Diagnosis Date  . ALLERGIC RHINITIS   . ARTHROSCOPY, KNEE, HX OF   . HEPATIC CYST   . WISDOM TEETH EXTRACTION, HX OF   . Seasonal allergies   . ANXIETY     hx - no meds  . DEPRESSION     hx - no meds   Past Surgical History  Procedure Date  . Cesarean section 09/2008  . Benign liver cyst removed 1988  . Knee arthroscopy     left   . Wisdom teeth extraction 2004   Family History: family history includes Breast cancer in her other; Cancer in her maternal aunt and maternal grandmother; Colon cancer in her other; Diabetes in her other; Hypertension in her other; Lung cancer in her other; and Stroke in her paternal grandfather and paternal grandmother. Social History:  reports that she has never smoked. She has never used smokeless tobacco. She reports that she drinks alcohol. She reports that she does not use illicit drugs.  Medications Prior to Admission  Medication Sig Dispense Refill  . Cetirizine HCl (ZYRTEC PO) Take 10 mg by mouth daily.       . Prenatal MV-Min-Fe Fum-FA-DHA (PRENATAL 1 PO) Take 1 tablet by mouth daily.       Marland Kitchen acetaminophen (TYLENOL) 500 MG tablet Take 1,000 mg by mouth every 6 (six) hours as needed. For headache        Prenatal Transfer Tool  Maternal Diabetes: No Genetic Screening: Normal Harmony negative for trisomy 53, 67,  21 Maternal Ultrasounds/Referrals: Normal / Last growth sono: EFW 9 lbs, normal AFI Fetal Ultrasounds or other Referrals:  None Maternal Substance Abuse:  No Significant Maternal Medications:  None Significant Maternal Lab Results:  None Other Comments:  H/O child diagnosed with trisomy after delivery (1st pregnancy)  Review of Systems  Constitutional: Negative for fever.  Eyes: Negative for blurred vision.  Gastrointestinal: Negative for nausea.  Neurological: Negative for headaches.  All other systems reviewed and are negative.      Blood pressure 125/73, pulse 86, temperature 98.6 F (37 C), temperature source Oral, resp. rate 18, SpO2 99.00%. Maternal Exam:  Uterine Assessment: No ctx  Abdomen: Patient reports no abdominal tenderness. Surgical scars: low transverse.   Fundal height is S>D.   Estimated fetal weight is 9#.   Fetal presentation: vertex  Introitus: not evaluated.     Fetal Exam Fetal Monitor Review: Mode: hand-held doppler probe.   Baseline rate: 150.  Pattern: no decelerations.       Physical Exam  Constitutional: She is oriented to person, place, and time. She appears well-developed and well-nourished.  HENT:  Head: Normocephalic and atraumatic.  Eyes: Pupils are equal, round, and reactive to light.  Neck: Normal range of motion. Neck supple.  No thyromegaly present.  Cardiovascular: Normal rate and regular rhythm.   No murmur heard. Respiratory: Effort normal and breath sounds normal.  GI: Soft. Bowel sounds are normal.       gravid  Genitourinary:       deferred  Musculoskeletal: Normal range of motion.  Neurological: She is alert and oriented to person, place, and time. She has normal reflexes.  Skin: Skin is warm and dry.  Psychiatric: She has a normal mood and affect.    Prenatal labs: ABO, Rh: --/--/O POS (10/02 1000) Antibody: NEG (10/02 0950) Rubella: Immune (02/27 0000) RPR: NON REACTIVE (10/02 0951)  HBsAg: Negative (02/27  0000)  HIV: Non-reactive (02/27 0000)  GBS:   negative  Assessment/Plan:  IUP at term, hx C section x 1, previous child w/ Down Syndrome, nl genetic screens this pregnancy Desires repeat C-section. Reassuring maternal / fetal status  Proceed with scheduled repeat C-section  Dr. Ernestina Penna consult  PAUL,DANIELA 02/26/2012, 11:09 AM  Agree with above. R/B d/w pt.   Marialuiza Car A. 02/26/2012 11:52 AM

## 2012-02-26 NOTE — Anesthesia Procedure Notes (Signed)

## 2012-02-26 NOTE — Anesthesia Postprocedure Evaluation (Signed)
  Anesthesia Post-op Note  Patient: Jamie Nelson  Procedure(s) Performed: Procedure(s) (LRB) with comments: CESAREAN SECTION (N/A) - EDD: 02/27/12  Patient Location: Mother/Baby  Anesthesia Type: Spinal  Level of Consciousness: awake, alert  and oriented  Airway and Oxygen Therapy: Patient Spontanous Breathing  Post-op Pain: none  Post-op Assessment: Post-op Vital signs reviewed, Patient's Cardiovascular Status Stable, Pain level controlled, No headache, No backache, No residual numbness and No residual motor weakness  Post-op Vital Signs: Reviewed and stable  Complications: No apparent anesthesia complications

## 2012-02-27 LAB — CBC
HCT: 34 % — ABNORMAL LOW (ref 36.0–46.0)
Hemoglobin: 11.3 g/dL — ABNORMAL LOW (ref 12.0–15.0)
MCH: 29.2 pg (ref 26.0–34.0)
MCHC: 33.2 g/dL (ref 30.0–36.0)

## 2012-02-27 MED ORDER — TETANUS-DIPHTH-ACELL PERTUSSIS 5-2.5-18.5 LF-MCG/0.5 IM SUSP
0.5000 mL | Freq: Once | INTRAMUSCULAR | Status: AC
  Administered 2012-02-28: 0.5 mL via INTRAMUSCULAR
  Filled 2012-02-27: qty 0.5

## 2012-02-27 NOTE — Progress Notes (Signed)
Subjective: POD# 1 Information for the patient's newborn:  Jamie Nelson, Jamie Nelson [[id number]]  female   Reports feeling well, desires early DC. Feeding: breast Patient reports tolerating PO.  Breast symptoms: none Pain controlled with Motrin and Percocet. Denies HA/SOB/C/P/N/V/dizziness. Flatus minimal. She reports vaginal bleeding as normal, without clots.  She is ambulating, urinating without difficult.     Objective:   VS:  Filed Vitals:   02/26/12 2138 02/27/12 0008 02/27/12 0317 02/27/12 0810  BP: 129/57 122/47 115/57 123/74  Pulse: 76 72 81 73  Temp: 98.5 F (36.9 C) 98.8 F (37.1 C) 98.6 F (37 C) 98.6 F (37 C)  TempSrc: Oral Oral Oral Oral  Resp: 16 16 16 18   Weight:      SpO2: 97% 97% 97% 98%     Intake/Output Summary (Last 24 hours) at 02/27/12 0933 Last data filed at 02/27/12 0830  Gross per 24 hour  Intake   4200 ml  Output   4400 ml  Net   -200 ml        Basename 02/27/12 0555 02/24/12 0951  WBC 12.5* 10.6*  HGB 11.3* 12.7  HCT 34.0* 38.7  PLT 124* 132*     Blood type: --/--/O POS (10/04 1026)  Rubella: Immune (02/27 0000)     Physical Exam:  General: alert, cooperative and no distress CV: Regular rate and rhythm Resp: clear Abdomen: soft, nontender, normal bowel sounds Incision: clean, dry, intact and dressing in place Uterine Fundus: firm, below umbilicus, nontender Lochia: minimal Ext: Homans sign is negative, no sign of DVT and no edema, redness or tenderness in the calves or thighs      Assessment/Plan: 31 y.o.   POD# 1. [id number]                  Active Problems:  S/P cesarean section (10/4)  Postpartum care following cesarean delivery Repeat elective  Doing well, stable.     Ambulate Routine post-op care Anticipate discharge home in AM.  PAUL,DANIELA 02/27/2012, 9:33 AM

## 2012-02-28 LAB — TYPE AND SCREEN: Unit division: 0

## 2012-02-28 MED ORDER — IBUPROFEN 600 MG PO TABS: 600.0000 mg | ORAL_TABLET | Freq: Four times a day (QID) | ORAL | Status: DC | PRN

## 2012-02-28 MED ORDER — OXYCODONE-ACETAMINOPHEN 5-325 MG PO TABS: 1.0000 | ORAL_TABLET | ORAL | Status: DC | PRN

## 2012-02-28 NOTE — Progress Notes (Signed)
Subjective: POD# 2 Information for the patient's newborn:  Jamie Nelson, Jamie Nelson [[id number]]  female    Reports feeling well, desires early DC. Feeding: breast Patient reports tolerating PO.  Breast symptoms: none Pain controlled with Motrin and Percocet. Denies HA/SOB/C/P/N/V/dizziness. Flatus present. She reports vaginal bleeding as normal, without clots.  She is ambulating, urinating without difficult.     Objective:   VS:  Filed Vitals:   02/27/12 1203 02/27/12 1400 02/27/12 2126 02/28/12 0558  BP: 123/70 122/76 133/78 100/63  Pulse: 72 68 74 67  Temp: 98.9 F (37.2 C) 97.6 F (36.4 C) 98.6 F (37 C) 97.8 F (36.6 C)  TempSrc: Oral Oral Oral Oral  Resp:  18 18 18   Weight:      SpO2: 98%       No intake or output data in the 24 hours ending 02/28/12 0846       Basename 02/27/12 0555  WBC 12.5*  HGB 11.3*  HCT 34.0*  PLT 124*     Blood type: --/--/O POS (10/04 1026)  Rubella: Immune (02/27 0000)     Physical Exam:  General: alert, cooperative and no distress CV: Regular rate and rhythm Resp: clear Abdomen: soft, nontender, normal bowel sounds Incision: clean, dry, intact, skin well approximated w/ suture, mild edema superior to incision line, no ecchymosis / drainage, + erythema/mild Uterine Fundus: firm, below umbilicus, nontender Lochia: minimal Ext: Homans sign is negative, no sign of DVT and no edema, redness or tenderness in the calves or thighs      Assessment/Plan: 31 y.o.   POD# 2. [id number]                  Active Problems:  S/P cesarean section (10/4)  Postpartum care following cesarean delivery Repeat elective  Doing well, stable.     TDaP, given, flu vaccine up to date Routine post-op care DC home w/ WOB instructions.   Tiffanyann Deroo 02/28/2012, 8:46 AM

## 2012-02-28 NOTE — Discharge Summary (Signed)
POSTOPERATIVE DISCHARGE SUMMARY:  Patient ID: Jamie Nelson MRN: [id number] DOB/AGE: [birth year] 31 y.o.  Admit date: 02/26/2012 Discharge date:  02/28/2012   Admission Diagnoses: 1. Term pregnancy for repeat cesarean section  Discharge Diagnoses:   Term Pregnancy-delivered, repeat C-section  Prenatal history: G2P2002   EDC : 02/27/2012, by Other Basis  Prenatal care at [facility] since 8.[redacted] weeks gestation, primary T. Fredric Mare, CNM.  Cesarean delivery with 1st pregnancy due to arrest of labor - baby weighed 6lbs 11oz. Was considering TOLAC previously - now desires repeat cesarean delivery due to EFW = 9lbs / Vertex not engaged / cervix unfavorable for IOL.   Prenatal Labs: ABO, Rh: O (02/27 0000)  Antibody: NEG (10/04 1026) Rubella: Immune (02/27 0000)  RPR: NON REACTIVE (10/02 0951)  HBsAg: Negative (02/27 0000)  HIV: Non-reactive (02/27 0000)  GBS:   negative 1 hr Glucola : 83   Medical / Surgical History :  Past medical history:  Past Medical History  Diagnosis Date  . ALLERGIC RHINITIS   . ARTHROSCOPY, KNEE, HX OF   . HEPATIC CYST   . WISDOM TEETH EXTRACTION, HX OF   . Seasonal allergies   . ANXIETY     hx - no meds  . DEPRESSION     hx - no meds  . S/P cesarean section (10/4) 02/26/2012  . Postpartum care following cesarean delivery 02/26/2012    Past surgical history:  Past Surgical History  Procedure Date  . Cesarean section 09/2008  . Benign liver cyst removed 1988  . Knee arthroscopy     left   . Wisdom teeth extraction 2004    Family History:  Family History  Problem Relation Age of Onset  . Breast cancer Other   . Colon cancer Other     parent, grandparent  . Diabetes Other     grandparent  . Hypertension Other     parent  . Lung cancer Other   . Cancer Maternal Aunt     Breast  . Cancer Maternal Grandmother     Lung  . Stroke Paternal Grandmother   . Stroke Paternal Grandfather     Social History:  reports that she  has never smoked. She has never used smokeless tobacco. She reports that she drinks alcohol. She reports that she does not use illicit drugs.   Allergies: Nitrofurantoin    Current Medications at time of admission:  Prescriptions prior to admission  Medication Sig Dispense Refill  . Cetirizine HCl (ZYRTEC PO) Take 10 mg by mouth daily.       . Prenatal MV-Min-Fe Fum-FA-DHA (PRENATAL 1 PO) Take 1 tablet by mouth daily.       Marland Kitchen DISCONTD: acetaminophen (TYLENOL) 500 MG tablet Take 1,000 mg by mouth every 6 (six) hours as needed. For headache         Admit labs:  02/24/2012 09:51  WBC 10.6 (H)  RBC 4.46  Hemoglobin 12.7  HCT 38.7  MCV 86.8  MCH 28.5  MCHC 32.8  RDW 14.5  Platelets 132 (L)     Intrapartum Course: NA  Procedures: Cesarean section delivery of female newborn by Dr Ernestina Penna  See operative report for further details  Postoperative / postpartum course:  uneventful  Physical Exam:  VSS: Blood pressure 100/63, pulse 67, temperature 97.8 F (36.6 C), temperature source Oral, resp. rate 18, weight 98.431 kg (217 lb), SpO2 98.00%, unknown if currently breastfeeding.   LABS:  Basename 02/27/12 0555  WBC 12.5*  HGB  11.3*  HCT 34.0*  PLT 124*    I&O: I/O last 3 completed shifts: In: 1050 [Other:1050] Out: 3250 [Urine:3250]      Incision:  approximated with suture / mild erythema / no ecchymosis / no drainage   Discharge Instructions:  Discharged Condition: good Activity: pelvic rest, weight lifting and driving restrictions x 2 weeks Diet: routine Medications:    Medication List     As of 02/28/2012  8:59 AM    STOP taking these medications         acetaminophen 500 MG tablet   Commonly known as: TYLENOL      TAKE these medications         ibuprofen 600 MG tablet   Commonly known as: ADVIL,MOTRIN   Take 1 tablet (600 mg total) by mouth every 6 (six) hours as needed.      oxyCODONE-acetaminophen 5-325 MG per tablet   Commonly known as:  PERCOCET/ROXICET   Take 1-2 tablets by mouth every 4 (four) hours as needed (moderate - severe pain).      PRENATAL 1 PO   Take 1 tablet by mouth daily.      ZYRTEC PO   Take 10 mg by mouth daily.       Condition: stable Postpartum Instructions: refer to practice specific booklet Discharge to: home Disposition: 01-Home or Self Care Follow up :      Follow-up Information    Follow up with [name], CNM. In 6 weeks.   Contact information:   [address] [place] [address] [phone]           Signed: Arlan Organ 02/28/2012, 8:59 AM

## 2012-02-29 ENCOUNTER — Encounter (HOSPITAL_COMMUNITY): Payer: Self-pay | Admitting: Obstetrics

## 2012-03-02 ENCOUNTER — Ambulatory Visit (HOSPITAL_COMMUNITY)
Admission: RE | Admit: 2012-03-02 | Discharge: 2012-03-02 | Disposition: A | Discharge status: Home / Self Care | Payer: 59 | Source: Ambulatory Visit | Attending: Obstetrics | Admitting: Obstetrics

## 2012-03-04 NOTE — Progress Notes (Signed)
Adult Lactation Consultation Outpatient Visit Note  Patient Name: Jamie Nelson (mother)     BABY: Jamie Nelson Date of Birth: Oct 06, 1980                                 DOB: [birth year] Gestational Age at Delivery: 39.6                   BIRTH WEIGHT: 8-9.6 Type of Delivery: REPEAT C/S                      WEIGHT TODAY: 8-1.1  Breastfeeding History: Frequency of Breastfeeding: EVERY 1-3 HOURS ATTEMPTS ONLY AT TIMES Length of Feeding:  Voids: QS Stools: QS SEEDY  Supplementing / Method:FORMULA 2 OZ. EVERY 2-3 HOURS PER BOTTLE Pumping:  Type of Pump:PUMP IN STYLE   Frequency:EVERY 3 HOURS  Volume:  DROPS OF COLOSTRUM  Comments:    Consultation Evaluation:Mom and 5 day old newborn here for assist with difficult latch since discharge.  This is mom's second child and her first son was born with downs syndrome and did not breastfeed well but mom never produced more than a few mls when pumping every 3 hours.  Mom teary eyed today and worried about her supply.  She states the baby continued to act frantic hungry after feedings so they started supplementing with formula 2 days ago.  She states baby was latching well in hospital but now the baby fights the breast.  She did start pumping when formula was started but only obtaining drops.  We discussed that this time there is less stress and baby has been successful at the breast so milk may just be delayed.  Mom reports significant breast changes with this pregnancy which she did not experience with first.  Breasts appear to be filling with some fullness.  Mom reports she just noticed more fullness last night.  Assisted with positioning and latching baby to breast.  Baby pulling away and crying.  24 mm nipple shield used and baby did latch and nurse well for a few minutes but she pulled off crying and chewing on her hands.  Baby was given 24 mls of formula so she would calm to latch.  Baby did latch and nursed actively with breast massage and  stimulation.  Stressed the importance of keeping baby stimulated and increasing milk flow with massage.  Only a few audible swallows heard and no milk transfer shown with post weight.  Plan is to go home and try to relax and rest as much as possible.    Feed baby on cue using nipple shield if helpful.  Pump breasts after feeding and use EBM/formula 45-41mls to supplement until milk supply better established.  Mom also given an SNS with instructions to use if she desires.  Information on fenugreek and moringa given.  Encouraged to call with any concerns prior to next appointment.  Initial Feeding Assessment: Pre-feed Weight: Post-feed Weight: Amount Transferred: Comments:  Additional Feeding Assessment: Pre-feed Weight: Post-feed Weight: Amount Transferred: Comments:  Additional Feeding Assessment: Pre-feed Weight: Post-feed Weight: Amount Transferred: Comments:  Total Breast milk Transferred this Visit:  Total Supplement Given:   Additional Interventions:   Follow-Up LACTATION APPOINTMENT 03/15/12      Jamie Nelson 03/04/2012, 4:19 PM

## 2012-03-15 ENCOUNTER — Ambulatory Visit (HOSPITAL_COMMUNITY)
Admission: RE | Admit: 2012-03-15 | Discharge: 2012-03-15 | Disposition: A | Discharge status: Home / Self Care | Payer: 59 | Source: Ambulatory Visit | Attending: Obstetrics | Admitting: Obstetrics

## 2012-03-15 NOTE — Progress Notes (Signed)
Adult Lactation Consultation Outpatient Visit Note  Patient Name: Jamie Nelson (mother)     BABY: Jamie Nelson Date of Birth: Feb 02, 1981                                 DOB: [birth year] Gestational Age at Delivery: 39.6                   BIRTH WEIGHT: 8-9.6 Type of Delivery: C/S                                      WEIGHT TODAY: 9-6.5  Breastfeeding History: Frequency of Breastfeeding: EVERY 1-3 HOURS Length of Feeding: 10-20 MINUTES EACH BREAST Voids: qs Stools: qs  Supplementing / Method:EBM/FORMULA 3 OZ EVERY 3 HOURS PER BOTTLE Pumping:  Type of Pump:PUMP IN STYLE   Frequency: 5-6 TIMES PER DAY POST PUMPING  Volume:  5- 15 MLS   30-45 MLS/24 HOURS  Comments:    Consultation Evaluation:  Mom and 54 day old baby here for feeding assessment.  Mom has hx of low milk supply with first baby and stopped breastfeeding after 2 weeks and no improvement.  Mom was seen in our office when baby was 61 days old and at that time was only pumping drops.  Since that appointment mom has continued to breastfeed Jamie at each feeding and followed feeding with pumping both breasts.  She obtains a total of 30-45 mls in 24 hours.  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  She does not have any other risk factor for low supply.  Breasts are very soft.  Observed mom latch baby to both breasts easily with deep latch.  Jamie nurses actively with good stimulation and breast compressions but few audible swallows.  Baby transferred 8 mls.  Mom voices disappointment that supply is minimal.  Praised mom for doing all she can to increase supply and encouraged to continue giving baby any breast milk obtained.  Discussed planning a nursing marathon for 1-2 days, power pumping and starting herbal supplements.  Information given on fenugreek and moringa.  Mom states she will continue to breastfeed with additional pumping for another 2 weeks before making a  decision.  Encouraged to call [facility] office for any questions or if a further [facility] appointment desired.  Initial Feeding Assessment: 20 MINUTES ON EACH BREAST Pre-feed [id number] Post-feed [id number] Amount Transferred:8 MLS Comments:  Additional Feeding Assessment: Pre-feed Weight: Post-feed Weight: Amount Transferred: Comments:  Additional Feeding Assessment: Pre-feed Weight: Post-feed Weight: Amount Transferred: Comments:  Total Breast milk Transferred this Visit: 8 MLS Total Supplement Given: 90 MLS FORMULA  Additional Interventions: POST PUMPED 3 MLS   Follow-Up WILL CALL [facility] office prn      Jamie Nelson 03/15/2012, 4:03 PM

## 2012-07-01 ENCOUNTER — Encounter: Payer: Self-pay | Admitting: Internal Medicine

## 2012-07-01 ENCOUNTER — Ambulatory Visit (INDEPENDENT_AMBULATORY_CARE_PROVIDER_SITE_OTHER): Payer: 59 | Admitting: Internal Medicine

## 2012-07-01 VITALS — BP 120/70 | HR 64 | Temp 98.0°F | Wt 207.0 lb

## 2012-07-01 DIAGNOSIS — F411 Generalized anxiety disorder: Secondary | ICD-10-CM

## 2012-07-01 DIAGNOSIS — F329 Major depressive disorder, single episode, unspecified: Secondary | ICD-10-CM

## 2012-07-01 MED ORDER — BUPROPION HCL ER (XL) 150 MG PO TB24: 150.0000 mg | ORAL_TABLET | Freq: Every day | ORAL | Status: DC

## 2012-07-01 NOTE — Assessment & Plan Note (Signed)
History of postpartum depression - prev on cymbalta Verified no si/hi Start wellbutrin xl 150 qd and refer for counseling - Jamie Nelson

## 2012-07-01 NOTE — Progress Notes (Signed)
  Subjective:    Patient ID: Jamie Nelson, female    DOB: 10-09-1980, 32 y.o.   MRN: [id number]  HPI  Here today with complaints of postpartum depression and anxiety History of same following birth of first child who is now 87-1/2 years old Second child born October 2013, now 39 months old Patient describes increasing irritability, easy tearfulness, dysphoric mood and low energy Increasing vivid and disturbing dreams Strongly denies suicidal or homicidal ideation Prior treatment included Cymbalta which was effective, however patient concerned with weight gain side effect Also enrolled in group counseling and support group for mothers of children with Down syndrome, but would be interested in individual counseling is available  Past Medical History  Diagnosis Date  . ALLERGIC RHINITIS   . ARTHROSCOPY, KNEE, HX OF   . HEPATIC CYST   . WISDOM TEETH EXTRACTION, HX OF   . Seasonal allergies   . ANXIETY     hx - no meds  . DEPRESSION     hx - no meds  . S/P cesarean section (10/4) 02/26/2012  . Postpartum care following cesarean delivery 02/26/2012    Review of Systems  Constitutional: Positive for fatigue.  Neurological: Negative for dizziness and headaches.  Psychiatric/Behavioral: Positive for sleep disturbance, dysphoric mood and decreased concentration. Negative for suicidal ideas, hallucinations, behavioral problems, confusion and self-injury. The patient is nervous/anxious. The patient is not hyperactive.        Objective:   Physical Exam BP 120/70  Pulse 64  Temp 98 F (36.7 C) (Oral)  Wt 207 lb (93.895 kg)  SpO2 97% Wt Readings from Last 3 Encounters:  07/01/12 207 lb (93.895 kg)  02/26/12 217 lb (98.431 kg)  02/26/12 217 lb (98.431 kg)   Constitutional: She is overweight, emotional, but appears well-developed and well-nourished. Cardiovascular: Normal rate, regular rhythm and normal heart sounds.  No murmur heard. No BLE edema. Pulmonary/Chest: Effort normal and  breath sounds normal. No respiratory distress. She has no wheezes.  Psychiatric: She has a dysphoric and easily tearful mood/affect. Her behavior is normal. Judgment and thought content normal.   Lab Results  Component Value Date   WBC 12.5* 02/27/2012   HGB 11.3* 02/27/2012   HCT 34.0* 02/27/2012   PLT 124* 02/27/2012   GLUCOSE 83 06/26/2011   CHOL 204* 06/26/2011   TRIG 75.0 06/26/2011   HDL 56.20 06/26/2011   LDLDIRECT 132.6 06/26/2011   ALT 27 06/26/2011   AST 19 06/26/2011   NA 136 06/26/2011   K 4.3 06/26/2011   CL 104 06/26/2011   CREATININE 0.7 06/26/2011   BUN 16 06/26/2011   CO2 25 06/26/2011   TSH 1.22 06/26/2011       Assessment & Plan:   See problem list. Medications and labs reviewed today.  Time spent with patient today 25 minutes, greater than 50% time spent counseling patient on anxiety and postpartum depression and medication review. Also review of prior records

## 2012-07-01 NOTE — Patient Instructions (Signed)
It was good to see you today. Start Wellbutrin XL 150mg  daily - Your prescription(s) have been submitted to your pharmacy. Please take as directed and contact our office if you believe you are having problem(s) with the medication(s). we'll make referral to [facility] as discussed [phone]. Our office will contact you regarding appointment(s) once made. Please schedule followup in 6 weeks for symptoms and medication review, call sooner if problems.

## 2013-01-26 ENCOUNTER — Other Ambulatory Visit: Payer: Self-pay | Admitting: Internal Medicine

## 2013-05-25 HISTORY — PX: OTHER SURGICAL HISTORY: SHX169

## 2014-01-09 ENCOUNTER — Other Ambulatory Visit: Payer: Self-pay | Admitting: Obstetrics

## 2014-01-18 ENCOUNTER — Encounter (HOSPITAL_COMMUNITY): Payer: Self-pay | Admitting: Pharmacist

## 2014-01-25 ENCOUNTER — Encounter (HOSPITAL_COMMUNITY): Payer: Self-pay

## 2014-01-26 ENCOUNTER — Encounter (HOSPITAL_COMMUNITY)
Admission: RE | Admit: 2014-01-26 | Discharge: 2014-01-26 | Disposition: A | Discharge status: Home / Self Care | Payer: 59 | Source: Ambulatory Visit | Attending: Obstetrics | Admitting: Obstetrics

## 2014-01-26 DIAGNOSIS — O3421 Maternal care for scar from previous cesarean delivery: Secondary | ICD-10-CM | POA: Insufficient documentation

## 2014-01-26 DIAGNOSIS — Z309 Encounter for contraceptive management, unspecified: Secondary | ICD-10-CM | POA: Insufficient documentation

## 2014-01-26 DIAGNOSIS — Z01818 Encounter for other preprocedural examination: Secondary | ICD-10-CM | POA: Insufficient documentation

## 2014-01-26 LAB — CBC
HCT: 38.5 % (ref 36.0–46.0)
Hemoglobin: 13 g/dL (ref 12.0–15.0)
MCH: 29.4 pg (ref 26.0–34.0)
MCHC: 33.8 g/dL (ref 30.0–36.0)
MCV: 87.1 fL (ref 78.0–100.0)
Platelets: 135 10*3/uL — ABNORMAL LOW (ref 150–400)
RBC: 4.42 MIL/uL (ref 3.87–5.11)
RDW: 14.1 % (ref 11.5–15.5)
WBC: 9.8 10*3/uL (ref 4.0–10.5)

## 2014-01-26 LAB — TYPE AND SCREEN
ABO/RH(D): O POS
Antibody Screen: NEGATIVE

## 2014-01-26 LAB — RPR

## 2014-01-26 NOTE — Patient Instructions (Signed)
   Your procedure is scheduled on: SEPT 8 2015 AT 3PM  Enter through the Main Entrance of [facility] at: [place] [address] AT 130PM Pick up the phone at the desk and dial [phone] and inform us of your arrival.  Please call this number if you have any problems the morning of surgery: [phone]  Remember: Do not eat food after midnight:SEPT 7 Do not drink clear liquids after: YOU CAN HAVE CLEAR LIQUIDS TIL 11AM Take these medicines the morning of surgery with a SIP OF WATER:  Do not wear jewelry, make-up, or FINGER nail polish No metal in your hair or on your body. Do not wear lotions, powders, perfumes.  You may wear deodorant.  Do not bring valuables to the hospital. Contacts, dentures or bridgework may not be worn into surgery.  Leave suitcase in the car. After Surgery it may be brought to your room. For patients being admitted to the hospital, checkout time is 11:00am the day of discharge.    Patients discharged on the day of surgery will not be allowed to drive home.

## 2014-01-30 ENCOUNTER — Encounter (HOSPITAL_COMMUNITY): Payer: 59 | Admitting: Anesthesiology

## 2014-01-30 ENCOUNTER — Inpatient Hospital Stay (HOSPITAL_COMMUNITY): Payer: 59 | Admitting: Anesthesiology

## 2014-01-30 ENCOUNTER — Encounter (HOSPITAL_COMMUNITY)
Admission: RE | Disposition: A | Discharge status: Home / Self Care | Payer: Self-pay | Source: Ambulatory Visit | Attending: Obstetrics

## 2014-01-30 ENCOUNTER — Encounter (HOSPITAL_COMMUNITY): Payer: Self-pay | Admitting: Anesthesiology

## 2014-01-30 ENCOUNTER — Inpatient Hospital Stay (HOSPITAL_COMMUNITY)
Admission: RE | Admit: 2014-01-30 | Discharge: 2014-02-01 | DRG: 765 | Disposition: A | Discharge status: Home / Self Care | Payer: 59 | Source: Ambulatory Visit | Attending: Obstetrics | Admitting: Obstetrics

## 2014-01-30 DIAGNOSIS — Z801 Family history of malignant neoplasm of trachea, bronchus and lung: Secondary | ICD-10-CM

## 2014-01-30 DIAGNOSIS — Z833 Family history of diabetes mellitus: Secondary | ICD-10-CM | POA: Diagnosis not present

## 2014-01-30 DIAGNOSIS — K649 Unspecified hemorrhoids: Secondary | ICD-10-CM | POA: Diagnosis present

## 2014-01-30 DIAGNOSIS — O34219 Maternal care for unspecified type scar from previous cesarean delivery: Secondary | ICD-10-CM | POA: Diagnosis present

## 2014-01-30 DIAGNOSIS — Z800 Family history of malignant neoplasm of digestive organs: Secondary | ICD-10-CM

## 2014-01-30 DIAGNOSIS — O3660X0 Maternal care for excessive fetal growth, unspecified trimester, not applicable or unspecified: Secondary | ICD-10-CM | POA: Diagnosis present

## 2014-01-30 DIAGNOSIS — Z803 Family history of malignant neoplasm of breast: Secondary | ICD-10-CM

## 2014-01-30 DIAGNOSIS — Z823 Family history of stroke: Secondary | ICD-10-CM

## 2014-01-30 DIAGNOSIS — Z302 Encounter for sterilization: Secondary | ICD-10-CM | POA: Diagnosis not present

## 2014-01-30 DIAGNOSIS — O878 Other venous complications in the puerperium: Secondary | ICD-10-CM | POA: Diagnosis present

## 2014-01-30 HISTORY — PX: SCAR REVISION: SHX5285

## 2014-01-30 LAB — TYPE AND SCREEN
ABO/RH(D): O POS
Antibody Screen: NEGATIVE

## 2014-01-30 SURGERY — Surgical Case
Anesthesia: Spinal | Site: Abdomen

## 2014-01-30 MED ORDER — PHENYLEPHRINE 8 MG IN D5W 100 ML (0.08MG/ML) PREMIX OPTIME
INJECTION | INTRAVENOUS | Status: AC
  Filled 2014-01-30: qty 100

## 2014-01-30 MED ORDER — PHENYLEPHRINE HCL 10 MG/ML IJ SOLN
INTRAMUSCULAR | Status: DC | PRN
  Administered 2014-01-30 (×2): 80 ug via INTRAVENOUS

## 2014-01-30 MED ORDER — METOCLOPRAMIDE HCL 5 MG/ML IJ SOLN: 10.0000 mg | Freq: Three times a day (TID) | INTRAMUSCULAR | Status: DC | PRN

## 2014-01-30 MED ORDER — OXYCODONE-ACETAMINOPHEN 5-325 MG PO TABS: 2.0000 | ORAL_TABLET | ORAL | Status: DC | PRN

## 2014-01-30 MED ORDER — TRIAMCINOLONE ACETONIDE 40 MG/ML IJ SUSP
Freq: Once | INTRAMUSCULAR | Status: DC
  Filled 2014-01-30: qty 1

## 2014-01-30 MED ORDER — LACTATED RINGERS IV SOLN
INTRAVENOUS | Status: DC
  Administered 2014-01-30 (×3): via INTRAVENOUS

## 2014-01-30 MED ORDER — ZOLPIDEM TARTRATE 5 MG PO TABS
5.0000 mg | ORAL_TABLET | Freq: Every evening | ORAL | Status: DC | PRN
Start: 2014-01-30 — End: 2014-02-01

## 2014-01-30 MED ORDER — MENTHOL 3 MG MT LOZG: 1.0000 | LOZENGE | OROMUCOSAL | Status: DC | PRN

## 2014-01-30 MED ORDER — MEPERIDINE HCL 25 MG/ML IJ SOLN: 6.2500 mg | INTRAMUSCULAR | Status: DC | PRN

## 2014-01-30 MED ORDER — OXYTOCIN 10 UNIT/ML IJ SOLN
INTRAMUSCULAR | Status: AC
  Filled 2014-01-30: qty 4

## 2014-01-30 MED ORDER — SIMETHICONE 80 MG PO CHEW: 80.0000 mg | CHEWABLE_TABLET | ORAL | Status: DC | PRN

## 2014-01-30 MED ORDER — KETOROLAC TROMETHAMINE 30 MG/ML IJ SOLN: 30.0000 mg | Freq: Four times a day (QID) | INTRAMUSCULAR | Status: AC | PRN

## 2014-01-30 MED ORDER — KETOROLAC TROMETHAMINE 30 MG/ML IJ SOLN
30.0000 mg | Freq: Four times a day (QID) | INTRAMUSCULAR | Status: AC | PRN
  Administered 2014-01-30: 30 mg via INTRAMUSCULAR
  Filled 2014-01-30: qty 1

## 2014-01-30 MED ORDER — SCOPOLAMINE 1 MG/3DAYS TD PT72
1.0000 | MEDICATED_PATCH | Freq: Once | TRANSDERMAL | Status: DC
  Administered 2014-01-30: 1.5 mg via TRANSDERMAL

## 2014-01-30 MED ORDER — ONDANSETRON HCL 4 MG/2ML IJ SOLN
INTRAMUSCULAR | Status: DC | PRN
  Administered 2014-01-30: 4 mg via INTRAVENOUS

## 2014-01-30 MED ORDER — CEFAZOLIN SODIUM-DEXTROSE 2-3 GM-% IV SOLR
INTRAVENOUS | Status: AC
  Filled 2014-01-30: qty 50

## 2014-01-30 MED ORDER — ONDANSETRON HCL 4 MG/2ML IJ SOLN
INTRAMUSCULAR | Status: AC
  Filled 2014-01-30: qty 2

## 2014-01-30 MED ORDER — DIPHENHYDRAMINE HCL 50 MG/ML IJ SOLN: 12.5000 mg | INTRAMUSCULAR | Status: DC | PRN

## 2014-01-30 MED ORDER — OXYTOCIN 40 UNITS IN LACTATED RINGERS INFUSION - SIMPLE MED: 62.5000 mL/h | INTRAVENOUS | Status: AC

## 2014-01-30 MED ORDER — SIMETHICONE 80 MG PO CHEW
80.0000 mg | CHEWABLE_TABLET | ORAL | Status: DC
  Administered 2014-01-30: 80 mg via ORAL
  Filled 2014-01-30 (×2): qty 1

## 2014-01-30 MED ORDER — ONDANSETRON HCL 4 MG PO TABS: 4.0000 mg | ORAL_TABLET | ORAL | Status: DC | PRN

## 2014-01-30 MED ORDER — KETOROLAC TROMETHAMINE 30 MG/ML IJ SOLN: 15.0000 mg | Freq: Once | INTRAMUSCULAR | Status: DC | PRN

## 2014-01-30 MED ORDER — TRIAMCINOLONE ACETONIDE 40 MG/ML IJ SUSP
INTRAMUSCULAR | Status: DC | PRN
  Administered 2014-01-30: 16:00:00 via INTRAMUSCULAR

## 2014-01-30 MED ORDER — LANOLIN HYDROUS EX OINT: 1.0000 "application " | TOPICAL_OINTMENT | CUTANEOUS | Status: DC | PRN

## 2014-01-30 MED ORDER — LACTATED RINGERS IV SOLN
40.0000 [IU] | INTRAVENOUS | Status: DC | PRN
  Administered 2014-01-30: 40 [IU] via INTRAVENOUS

## 2014-01-30 MED ORDER — 0.9 % SODIUM CHLORIDE (POUR BTL) OPTIME
TOPICAL | Status: DC | PRN
  Administered 2014-01-30: 1000 mL

## 2014-01-30 MED ORDER — WITCH HAZEL-GLYCERIN EX PADS: 1.0000 "application " | MEDICATED_PAD | CUTANEOUS | Status: DC | PRN

## 2014-01-30 MED ORDER — PROMETHAZINE HCL 25 MG/ML IJ SOLN: 6.2500 mg | INTRAMUSCULAR | Status: DC | PRN

## 2014-01-30 MED ORDER — NALOXONE HCL 1 MG/ML IJ SOLN: 1.0000 ug/kg/h | INTRAVENOUS | Status: DC | PRN

## 2014-01-30 MED ORDER — MORPHINE SULFATE (PF) 0.5 MG/ML IJ SOLN
INTRAMUSCULAR | Status: DC | PRN
  Administered 2014-01-30: .2 mg via INTRATHECAL

## 2014-01-30 MED ORDER — SENNOSIDES-DOCUSATE SODIUM 8.6-50 MG PO TABS
2.0000 | ORAL_TABLET | ORAL | Status: DC
  Administered 2014-01-30 – 2014-01-31 (×2): 2 via ORAL
  Filled 2014-01-30 (×2): qty 2

## 2014-01-30 MED ORDER — PHENYLEPHRINE 8 MG IN D5W 100 ML (0.08MG/ML) PREMIX OPTIME
INJECTION | INTRAVENOUS | Status: DC | PRN
  Administered 2014-01-30: 60 ug/min via INTRAVENOUS

## 2014-01-30 MED ORDER — FENTANYL CITRATE 0.05 MG/ML IJ SOLN
INTRAMUSCULAR | Status: DC | PRN
  Administered 2014-01-30: 12.5 ug via INTRATHECAL

## 2014-01-30 MED ORDER — DIPHENHYDRAMINE HCL 25 MG PO CAPS
25.0000 mg | ORAL_CAPSULE | ORAL | Status: DC | PRN
  Filled 2014-01-30: qty 1

## 2014-01-30 MED ORDER — MORPHINE SULFATE 0.5 MG/ML IJ SOLN
INTRAMUSCULAR | Status: AC
  Filled 2014-01-30: qty 10

## 2014-01-30 MED ORDER — BUPIVACAINE IN DEXTROSE 0.75-8.25 % IT SOLN
INTRATHECAL | Status: DC | PRN
  Administered 2014-01-30: 1.4 mL via INTRATHECAL

## 2014-01-30 MED ORDER — SODIUM CHLORIDE 0.9 % IJ SOLN: 3.0000 mL | INTRAMUSCULAR | Status: DC | PRN

## 2014-01-30 MED ORDER — IBUPROFEN 600 MG PO TABS: 600.0000 mg | ORAL_TABLET | Freq: Four times a day (QID) | ORAL | Status: DC | PRN

## 2014-01-30 MED ORDER — FENTANYL CITRATE 0.05 MG/ML IJ SOLN
INTRAMUSCULAR | Status: AC
  Filled 2014-01-30: qty 2

## 2014-01-30 MED ORDER — LACTATED RINGERS IV SOLN
INTRAVENOUS | Status: DC
  Administered 2014-01-31: via INTRAVENOUS

## 2014-01-30 MED ORDER — NALBUPHINE HCL 10 MG/ML IJ SOLN: 5.0000 mg | INTRAMUSCULAR | Status: DC | PRN

## 2014-01-30 MED ORDER — CEFAZOLIN SODIUM-DEXTROSE 2-3 GM-% IV SOLR
2.0000 g | INTRAVENOUS | Status: AC
  Administered 2014-01-30: 2 g via INTRAVENOUS

## 2014-01-30 MED ORDER — HYDROMORPHONE HCL PF 1 MG/ML IJ SOLN: 0.2500 mg | INTRAMUSCULAR | Status: DC | PRN

## 2014-01-30 MED ORDER — DIPHENHYDRAMINE HCL 50 MG/ML IJ SOLN
25.0000 mg | INTRAMUSCULAR | Status: DC | PRN
  Filled 2014-01-30: qty 1

## 2014-01-30 MED ORDER — SCOPOLAMINE 1 MG/3DAYS TD PT72
MEDICATED_PATCH | TRANSDERMAL | Status: AC
  Filled 2014-01-30: qty 1

## 2014-01-30 MED ORDER — TETANUS-DIPHTH-ACELL PERTUSSIS 5-2.5-18.5 LF-MCG/0.5 IM SUSP: 0.5000 mL | Freq: Once | INTRAMUSCULAR | Status: DC

## 2014-01-30 MED ORDER — IBUPROFEN 600 MG PO TABS
600.0000 mg | ORAL_TABLET | Freq: Four times a day (QID) | ORAL | Status: DC
  Administered 2014-01-30 – 2014-02-01 (×6): 600 mg via ORAL
  Filled 2014-01-30 (×7): qty 1

## 2014-01-30 MED ORDER — NALOXONE HCL 0.4 MG/ML IJ SOLN: 0.4000 mg | INTRAMUSCULAR | Status: DC | PRN

## 2014-01-30 MED ORDER — SIMETHICONE 80 MG PO CHEW
80.0000 mg | CHEWABLE_TABLET | Freq: Three times a day (TID) | ORAL | Status: DC
  Administered 2014-01-31 – 2014-02-01 (×5): 80 mg via ORAL
  Filled 2014-01-30 (×4): qty 1

## 2014-01-30 MED ORDER — PRENATAL MULTIVITAMIN CH
1.0000 | ORAL_TABLET | Freq: Every day | ORAL | Status: DC
  Administered 2014-02-01: 1 via ORAL
  Filled 2014-01-30: qty 1

## 2014-01-30 MED ORDER — OXYCODONE-ACETAMINOPHEN 5-325 MG PO TABS
1.0000 | ORAL_TABLET | ORAL | Status: DC | PRN
  Administered 2014-01-31 – 2014-02-01 (×3): 1 via ORAL
  Filled 2014-01-30 (×2): qty 1

## 2014-01-30 MED ORDER — ONDANSETRON HCL 4 MG/2ML IJ SOLN: 4.0000 mg | INTRAMUSCULAR | Status: DC | PRN

## 2014-01-30 MED ORDER — LACTATED RINGERS IV SOLN
INTRAVENOUS | Status: DC | PRN
  Administered 2014-01-30: 16:00:00 via INTRAVENOUS

## 2014-01-30 MED ORDER — ONDANSETRON HCL 4 MG/2ML IJ SOLN: 4.0000 mg | Freq: Three times a day (TID) | INTRAMUSCULAR | Status: DC | PRN

## 2014-01-30 MED ORDER — DIPHENHYDRAMINE HCL 25 MG PO CAPS: 25.0000 mg | ORAL_CAPSULE | Freq: Four times a day (QID) | ORAL | Status: DC | PRN

## 2014-01-30 MED ORDER — DIBUCAINE 1 % RE OINT: 1.0000 "application " | TOPICAL_OINTMENT | RECTAL | Status: DC | PRN

## 2014-01-30 SURGICAL SUPPLY — 41 items
APL SKNCLS STERI-STRIP NONHPOA (GAUZE/BANDAGES/DRESSINGS) ×2
BENZOIN TINCTURE PRP APPL 2/3 (GAUZE/BANDAGES/DRESSINGS) ×2 IMPLANT
BLADE SURG 10 STRL SS (BLADE) ×8 IMPLANT
CLAMP CORD UMBIL (MISCELLANEOUS) ×2 IMPLANT
CLOSURE WOUND 1/2 X4 (GAUZE/BANDAGES/DRESSINGS) ×1
CLOTH BEACON ORANGE TIMEOUT ST (SAFETY) ×4 IMPLANT
CONTAINER PREFILL 10% NBF 15ML (MISCELLANEOUS) ×4 IMPLANT
DRAPE LG THREE QUARTER DISP (DRAPES) ×2 IMPLANT
DRSG OPSITE POSTOP 4X10 (GAUZE/BANDAGES/DRESSINGS) ×4 IMPLANT
DURAPREP 26ML APPLICATOR (WOUND CARE) ×4 IMPLANT
ELECT REM PT RETURN 9FT ADLT (ELECTROSURGICAL) ×4
ELECTRODE REM PT RTRN 9FT ADLT (ELECTROSURGICAL) ×2 IMPLANT
EXTRACTOR VACUUM KIWI (MISCELLANEOUS) IMPLANT
EXTRACTOR VACUUM M CUP 4 TUBE (SUCTIONS) IMPLANT
EXTRACTOR VACUUM M CUP 4' TUBE (SUCTIONS)
GLOVE BIO SURGEON STRL SZ 6.5 (GLOVE) ×3 IMPLANT
GLOVE BIO SURGEONS STRL SZ 6.5 (GLOVE) ×1
GLOVE BIOGEL PI IND STRL 7.0 (GLOVE) ×2 IMPLANT
GLOVE BIOGEL PI INDICATOR 7.0 (GLOVE) ×2
GOWN STRL REUS W/TWL LRG LVL3 (GOWN DISPOSABLE) ×8 IMPLANT
KIT ABG SYR 3ML LUER SLIP (SYRINGE) IMPLANT
NDL HYPO 25X5/8 SAFETYGLIDE (NEEDLE) IMPLANT
NEEDLE HYPO 25X5/8 SAFETYGLIDE (NEEDLE) IMPLANT
NS IRRIG 1000ML POUR BTL (IV SOLUTION) ×4 IMPLANT
PACK C SECTION WH (CUSTOM PROCEDURE TRAY) ×4 IMPLANT
PAD ABD 7.5X8 STRL (GAUZE/BANDAGES/DRESSINGS) ×2 IMPLANT
PAD OB MATERNITY 4.3X12.25 (PERSONAL CARE ITEMS) ×4 IMPLANT
STAPLER VISISTAT 35W (STAPLE) IMPLANT
STRIP CLOSURE SKIN 1/2X4 (GAUZE/BANDAGES/DRESSINGS) ×1 IMPLANT
SUT MON AB 4-0 PS1 27 (SUTURE) IMPLANT
SUT PLAIN 0 NONE (SUTURE) ×4 IMPLANT
SUT PLAIN 2 0 XLH (SUTURE) ×2 IMPLANT
SUT VIC AB 0 CT1 36 (SUTURE) ×4 IMPLANT
SUT VIC AB 0 CTX 36 (SUTURE) ×8
SUT VIC AB 0 CTX36XBRD ANBCTRL (SUTURE) ×4 IMPLANT
SUT VIC AB 2-0 CT1 27 (SUTURE) ×4
SUT VIC AB 2-0 CT1 TAPERPNT 27 (SUTURE) ×2 IMPLANT
TAPE CLOTH SURG 4X10 WHT LF (GAUZE/BANDAGES/DRESSINGS) ×2 IMPLANT
TOWEL OR 17X24 6PK STRL BLUE (TOWEL DISPOSABLE) ×4 IMPLANT
TRAY FOLEY CATH 14FR (SET/KITS/TRAYS/PACK) ×2 IMPLANT
WATER STERILE IRR 1000ML POUR (IV SOLUTION) ×2 IMPLANT

## 2014-01-30 NOTE — Transfer of Care (Signed)
Immediate Anesthesia Transfer of Care Note  Patient: Jamie Nelson  Procedure(s) Performed: Procedure(s) with comments: Repeat CESAREAN SECTION WITH TUBAL LIGATION (Bilateral) - EDD: 02/03/14 SCAR REVISION (N/A)  Patient Location: PACU  Anesthesia Type:Spinal  Level of Consciousness: awake  Airway & Oxygen Therapy: Patient Spontanous Breathing  Post-op Assessment: Report given to PACU RN  Post vital signs: Reviewed and stable  Complications: No apparent anesthesia complications

## 2014-01-30 NOTE — H&P (Signed)
Jamie Nelson is a 33 y.o. G3P2002 at 39'3 presenting for RCS w/ BTL and scar revision. Pt notes no contractions. Good fetal movement, No vaginal bleeding, not leaking fluid.  PNCare at [place] since 1st trimester. - prior c/s x 2, for repeat - first child with Down Syndrome. Nl Informaseq, nl AFP - hemorrhoids - LGA. DS 135, nl home BS testing x 1-2 wks.    Prenatal Transfer Tool  Maternal Diabetes: No Genetic Screening: Normal Maternal Ultrasounds/Referrals: Normal Fetal Ultrasounds or other Referrals:  None Maternal Substance Abuse:  No Significant Maternal Medications:  None Significant Maternal Lab Results: None     OB History   Grav Para Term Preterm Abortions TAB SAB Ect Mult Living   3 2 2       2      Past Medical History  Diagnosis Date  . ALLERGIC RHINITIS   . ARTHROSCOPY, KNEE, HX OF   . HEPATIC CYST   . WISDOM TEETH EXTRACTION, HX OF   . Seasonal allergies   . ANXIETY     hx - no meds  . DEPRESSION     hx - no meds  . S/P cesarean section (10/4) 02/26/2012  . Postpartum care following cesarean delivery 02/26/2012   Past Surgical History  Procedure Laterality Date  . Cesarean section  09/2008  . Benign liver cyst removed  1988  . Knee arthroscopy      left   . Wisdom teeth extraction  2004  . Cesarean section  02/26/2012    Procedure: CESAREAN SECTION;  Surgeon: Jamie Billings A. Pamala Hurry, MD;  Location: [facility];  Service: Obstetrics;  Laterality: N/A;  EDD: 02/27/12   Family History: family history includes Breast cancer in her other; Cancer in her maternal aunt and maternal grandmother; Colon cancer in her other; Diabetes in her other; Hypertension in her other; Lung cancer in her other; Stroke in her paternal grandfather and paternal grandmother. Social History:  reports that she has never smoked. She has never used smokeless tobacco. She reports that she drinks alcohol. She reports that she does not use illicit drugs.  Review of Systems - Negative  except discomfort of pregnancy     Blood pressure 138/82, pulse 99, temperature 97.7 F (36.5 C), temperature source Oral, resp. rate 20, last menstrual period 04/29/2013, SpO2 100.00%.  Physical Exam:  Gen: well appearing, no distress CV: RRR Pulm: CTAB Back: no CVAT Abd: gravid, NT, no RUQ pain LE: trace edema, equal bilaterally, non-tender  CBC    Component Value Date/Time   WBC 9.8 01/26/2014 0930   RBC 4.42 01/26/2014 0930   HGB 13.0 01/26/2014 0930   HCT 38.5 01/26/2014 0930   PLT 135* 01/26/2014 0930   MCV 87.1 01/26/2014 0930   MCH 29.4 01/26/2014 0930   MCHC 33.8 01/26/2014 0930   RDW 14.1 01/26/2014 0930   LYMPHSABS 2.4 06/26/2011 0918   MONOABS 0.7 06/26/2011 0918   EOSABS 0.1 06/26/2011 0918   BASOSABS 0.1 06/26/2011 0918       Prenatal labs: ABO, Rh: --/--/O POS (09/04 6387) Antibody: NEG (09/04 0929) Rubella:  immune RPR: NON REAC (09/04 0930)  HBsAg:   neg HIV:   neg GBS:   neg 1 hr Glucola 135, nl BS testing at home  Genetic screening nl Informaseq, nl AFP Anatomy US normal   Assessment/Plan: 33 y.o. G3P2002 at 39+ weeks - RCS. R/B d/w pt.  - permanence of BTL d/w pt -Kenalog and scar revision   Jamie Augusta A.  01/30/2014, 2:38 PM

## 2014-01-30 NOTE — Anesthesia Postprocedure Evaluation (Signed)
Anesthesia Post Note  Patient: Jamie Nelson  Procedure(s) Performed: Procedure(s) (LRB): Repeat CESAREAN SECTION WITH TUBAL LIGATION (Bilateral) SCAR REVISION (N/A)  Anesthesia type: Spinal  Patient location: PACU  Post pain: Pain level controlled  Post assessment: Post-op Vital signs reviewed  Last Vitals:  Filed Vitals:   01/30/14 1634  BP: 103/87  Pulse: 10  Temp: 36.7 C  Resp: 20    Post vital signs: Reviewed  Level of consciousness: awake  Complications: No apparent anesthesia complications

## 2014-01-30 NOTE — Lactation Note (Signed)
This note was copied from the chart of Jamie Nelson. Lactation Consultation Note  Mom has a history of low milk supply.  She would like [place] to respect her feeding plan which is to give as much breast milk as possible and supplement with formula as needed.  Many things at home need her attention.  Her first child had down's syndrome,  She triple fed him and did not make enough milk for him.  She also did not make enough milk with the second baby. She has taken fenugreek and domperidone without a significant increase in supply.  She reports having been diagnosed with insufficient glandular tissue but when assessing her at this time it seems that this diagnosis may no longer apply.  She has some spacing between her breasts but it is not wide. She did have some breast changes with this pregnancy and colostrum is easily expressible.  Her areolas became darker.  Mom becomes sleepy with feedings from hormonal release,  She has no thyroid, glucose or fertility issues.  My observations of Jamie Nelson are that her upper labial frenulum is attached close to the alveolar ridge which is highly suggestive of a posterior tongue tie.  I am able see a frenulum posteriorly and deep.  Jamie Nelson is able to extend her tongue but the tip of it has a divet from restriction.  SHe also has a bubble palate anteriorly.  When I advanced my gloved finger deeply into her mouth I was easily able to pull it out and break the suction.  I watched her feed.  When she first latched her lower jaw was quivering as she tried to attach.  She did attach and had  some long jaw excursions.  I noted some swallows.  I explained to parents that function of the tongue was more important that appearance.  With mom's history of low milk supply I can't help but wonder if there was difficulty with tongue function with her other children  Milk transfer was reviewed as were clues to lack there of, e.g., her breast beginning to make milk but the baby does not  drain it out.  Plan is for lactation to monitor as mom desires.  Patient Name: Jamie Nelson EQAST'M Date: 01/30/2014 Reason for consult: Initial assessment   Maternal Data Formula Feeding for Exclusion: Yes Reason for exclusion: Mother's choice to formula and breast feed on admission Has patient been taught Hand Expression?: Yes Does the patient have breastfeeding experience prior to this delivery?: Yes  Feeding Feeding Type: Breast Fed Length of feed: 15 min  LATCH Score/Interventions Latch: Repeated attempts needed to sustain latch, nipple held in mouth throughout feeding, stimulation needed to elicit sucking reflex. (Top lip not flanged)  Audible Swallowing: A few with stimulation Intervention(s): Skin to skin  Type of Nipple: Everted at rest and after stimulation  Comfort (Breast/Nipple): Soft / non-tender     Hold (Positioning): Assistance needed to correctly position infant at breast and maintain latch.  LATCH Score: 7  Lactation Tools Discussed/Used     Consult Status      Jamie Nelson 01/30/2014, 8:36 PM

## 2014-01-30 NOTE — Anesthesia Preprocedure Evaluation (Signed)
Anesthesia Evaluation  Patient identified by MRN, date of birth, ID band Patient awake    Reviewed: Allergy & Precautions, H&P , NPO status , Patient's Chart, lab work & pertinent test results  Airway Mallampati: I TM Distance: >3 FB Neck ROM: full    Dental no notable dental hx.    Pulmonary neg pulmonary ROS,    Pulmonary exam normal       Cardiovascular negative cardio ROS      Neuro/Psych negative neurological ROS     GI/Hepatic negative GI ROS, Neg liver ROS,   Endo/Other  negative endocrine ROS  Renal/GU negative Renal ROS     Musculoskeletal   Abdominal Normal abdominal exam  (+)   Peds  Hematology negative hematology ROS (+)   Anesthesia Other Findings   Reproductive/Obstetrics (+) Pregnancy                           Anesthesia Physical Anesthesia Plan  ASA: II  Anesthesia Plan: Spinal   Post-op Pain Management:    Induction:   Airway Management Planned:   Additional Equipment:   Intra-op Plan:   Post-operative Plan:   Informed Consent: I have reviewed the patients History and Physical, chart, labs and discussed the procedure including the risks, benefits and alternatives for the proposed anesthesia with the patient or authorized representative who has indicated his/her understanding and acceptance.     Plan Discussed with: CRNA and Surgeon  Anesthesia Plan Comments:         Anesthesia Quick Evaluation

## 2014-01-30 NOTE — Op Note (Signed)
01/30/2014  4:24 PM  PATIENT:  Jamie Nelson  33 y.o. female  PRE-OPERATIVE DIAGNOSIS:  Previous Cesarean Section,desires TUBAL LIGATION, scar revision  POST-OPERATIVE DIAGNOSIS:  Previous Cesarean Section,desires TUBAL LIGATION  PROCEDURE:  Procedure(s) with comments: Repeat CESAREAN SECTION WITH TUBAL LIGATION (Bilateral) - EDD: 02/03/14 SCAR REVISION (N/A)  SURGEON:  Surgeon(s) and Role:    Claiborne Billings A. Pamala Hurry, MD - Primary  PHYSICIAN ASSISTANT:   ASSISTANTSDrake Leach, CNM   ANESTHESIA:   local and spinal  EBL:  Total I/O In: 2900 [I.V.:2900] Out: 1100 [Urine:500; Blood:600]  BLOOD ADMINISTERED:none  DRAINS: Urinary Catheter (Foley)   LOCAL MEDICATIONS USED:  MARCAINE    and OTHER Kenalog  SPECIMEN:  Source of Specimen:  Placenta  DISPOSITION OF SPECIMEN:  N/A  COUNTS:  YES  TOURNIQUET:  * No tourniquets in log *  DICTATION: .Note written in EPIC  PLAN OF CARE: Admit to inpatient   PATIENT DISPOSITION:  PACU - hemodynamically stable.   Delay start of Pharmacological VTE agent (>24hrs) due to surgical blood loss or risk of bleeding: yes   Findings:  @BABYSEXEBC @ infant,  APGAR (1 MIN): 8   APGAR (5 MINS): 9   APGAR (10 MINS):   Normal uterus, tubes and ovaries, normal placenta. 3VC, clear amniotic fluid  EBL: per anasthesia Antibiotics:   2g Ancef Complications: none  Indications: This is a 33 y.o. year-old, G3P2  At 39+ admitted for RCS with scar revision and BTL. Risks benefits and alternatives of the procedure were discussed with the patient who agreed to proceed  Procedure:  After informed consent was obtained the patient was taken to the operating room where spinal anesthesia was initiated.  She was prepped and draped in the normal sterile fashion in dorsal supine position with a leftward tilt.  A foley catheter was in place. The old Pfannenstiel skin incision was marked and an elliptical incision with the scalpel made to remove the hypertrophic  scar.  Dissection was carried down with the Bovie cautery until the fascia was reached. Scarring in the subcutaneous layer was noted and some of the tissue readily opened with traction along planes of scar tissue. The fascia was incised in the midline. The incision was extended laterally with the Mayo scissors. The inferior aspect of the fascial incision was grasped with the Coker clamps, elevated up and the underlying rectus muscles were dissected off sharply. The superior aspect of the fascial incision was grasped with the Coker clamps elevated up and the underlying rectus muscles were dissected off sharply.  The peritoneum was entered sharply. The peritoneal incision was extended superiorly and inferiorly with good visualization of the bladder. The bladder blade was inserted and palpation was done to assess the fetal position and the location of the uterine vessels. A bladder flap was created with sharp and blunt dissection. The lower segment of the uterus was incised sharply with the scalpel and extended  bluntly in the cephalo-caudal fashion. The infant was grasped, brought to the incision,  rotated and the infant was delivered with fundal pressure. The nose and mouth were bulb suctioned. The cord was clamped and cut. The infant was handed off to the waiting pediatrician. The placenta was expressed. The uterus was exteriorized. The uterus was cleared of all clots and debris. The uterine incision was repaired with 0 Vicryl in a running locked fashion.  A second layer of the same suture was used in an imbricating fashion to obtain excellent hemostasis.  The fallopian tube was identified,  followed out to its fimbriated end and the mid-isthmic portion grasped with a  Babcock. A 2 cm knuckle of tube was tied off with a piece of plain gut.  2 ties were placed then the knuckle was removed sharply. This was done on both sided.  The uterus was then returned to the abdomen, the gutters were cleared of all clots and  debris. The tubal ligation site was identified and found to be hemostatic. The uterine incision was reinspected and found to be hemostatic. The peritoneum was grasped and closed with 2-0 Vicryl in a running fashion. The cut muscle edges and the underside of the fascia were inspected and found to be hemostatic. The fascia was closed with 0 Vicryl in two halves . The subcutaneous tissue was irrigated. The sub-cutaneous scar was removed with the bovie cautery. Scarpa's layer was closed with a 2-0 plain gut suture. The skin was closed with a 4-0 Monocryl in a single layer. The patient tolerated the procedure well. Sponge lap and needle counts were correct x3 and patient was taken to the recovery room in a stable condition.  Jamie Nelson A. 01/30/2014 4:25 PM

## 2014-01-30 NOTE — Anesthesia Procedure Notes (Signed)
Spinal  Patient location during procedure: OR Start time: 01/30/2014 2:48 PM End time: 01/30/2014 2:51 PM Staffing Anesthesiologist: Lyn Hollingshead Performed by: anesthesiologist  Preanesthetic Checklist Completed: patient identified, surgical consent, pre-op evaluation, timeout performed, IV checked, risks and benefits discussed and monitors and equipment checked Spinal Block Patient position: sitting Prep: site prepped and draped and DuraPrep Patient monitoring: heart rate, cardiac monitor, continuous pulse ox and blood pressure Approach: midline Location: L3-4 Injection technique: single-shot Needle Needle type: Sprotte  Needle gauge: 24 G Needle length: 9 cm Needle insertion depth: 5 cm Assessment Sensory level: T4

## 2014-01-30 NOTE — Brief Op Note (Signed)
01/30/2014  4:24 PM  PATIENT:  Jamie Nelson  33 y.o. female  PRE-OPERATIVE DIAGNOSIS:  Previous Cesarean Section,desires TUBAL LIGATION, scar revision  POST-OPERATIVE DIAGNOSIS:  Previous Cesarean Section,desires TUBAL LIGATION  PROCEDURE:  Procedure(s) with comments: Repeat CESAREAN SECTION WITH TUBAL LIGATION (Bilateral) - EDD: 02/03/14 SCAR REVISION (N/A)  SURGEON:  Surgeon(s) and Role:    Claiborne Billings A. Pamala Hurry, MD - Primary  PHYSICIAN ASSISTANT:   ASSISTANTSDrake Leach, CNM   ANESTHESIA:   local and spinal  EBL:  Total I/O In: 2900 [I.V.:2900] Out: 1100 [Urine:500; Blood:600]  BLOOD ADMINISTERED:none  DRAINS: Urinary Catheter (Foley)   LOCAL MEDICATIONS USED:  MARCAINE    and OTHER Kenalog  SPECIMEN:  Source of Specimen:  Placenta  DISPOSITION OF SPECIMEN:  N/A  COUNTS:  YES  TOURNIQUET:  * No tourniquets in log *  DICTATION: .Note written in EPIC  PLAN OF CARE: Admit to inpatient   PATIENT DISPOSITION:  PACU - hemodynamically stable.   Delay start of Pharmacological VTE agent (>24hrs) due to surgical blood loss or risk of bleeding: yes

## 2014-01-31 ENCOUNTER — Encounter (HOSPITAL_COMMUNITY): Payer: Self-pay | Admitting: Obstetrics

## 2014-01-31 DIAGNOSIS — O34219 Maternal care for unspecified type scar from previous cesarean delivery: Secondary | ICD-10-CM | POA: Diagnosis present

## 2014-01-31 LAB — CBC
HCT: 35.6 % — ABNORMAL LOW (ref 36.0–46.0)
Hemoglobin: 12 g/dL (ref 12.0–15.0)
MCH: 29 pg (ref 26.0–34.0)
MCHC: 33.7 g/dL (ref 30.0–36.0)
MCV: 86 fL (ref 78.0–100.0)
Platelets: 135 10*3/uL — ABNORMAL LOW (ref 150–400)
RBC: 4.14 MIL/uL (ref 3.87–5.11)
RDW: 14 % (ref 11.5–15.5)
WBC: 12.4 10*3/uL — ABNORMAL HIGH (ref 4.0–10.5)

## 2014-01-31 LAB — BIRTH TISSUE RECOVERY COLLECTION (PLACENTA DONATION)

## 2014-01-31 NOTE — Progress Notes (Addendum)
Patient ID: Jamie Nelson, female   DOB: 12/12/1980, 33 y.o.   MRN: [id number] Subjective: POD# 1 S/P Repeat C/S with BTL and scar revision Information for the patient's newborn:  Jamie, Alesi Girl Nelson [[id number]]  female  Reports feeling well Feeding: breast Patient reports tolerating PO.  Breast symptoms: none Pain controlled with ibuprofen (OTC) Denies HA/SOB/C/P/N/V/dizziness. Flatus present. She reports vaginal bleeding as normal, without clots.  She is ambulating, urinating without difficult.     Objective:   VS:  Filed Vitals:   01/30/14 2317 01/31/14 0120 01/31/14 0303 01/31/14 0640  BP: 117/67 110/47 113/53 110/53  Pulse: 92 68 70 66  Temp:  98.2 F (36.8 C) 98.3 F (36.8 C) 97.9 F (36.6 C)  TempSrc:      Resp:  16 16 20   Weight:      SpO2:  96% 95% 96%     Intake/Output Summary (Last 24 hours) at 01/31/14 1007 Last data filed at 01/31/14 0640  Gross per 24 hour  Intake 2962.5 ml  Output   3225 ml  Net -262.5 ml        Recent Labs  01/31/14 0600  WBC 12.4*  HGB 12.0  HCT 35.6*  PLT 135*     Blood type: --/--/O POS (09/08 1350)  Rubella:   Immune    Physical Exam:  General: alert, cooperative and no distress CV: Regular rate and rhythm, S1S2 present or without murmur or extra heart sounds Resp: clear Abdomen: soft, nontender, normal bowel sounds Incision: Tegaderm and Honeycomb dry and intact with small area of dried serosanguineous drainage on Lt side of dressing Uterine Fundus: firm, 2 FB below umbilicus, nontender Lochia: minimal Ext: edema trace and Homans sign is negative, no sign of DVT  Assessment/Plan: 33 y.o.   POD# 1.  s/p Cesarean Delivery.  Indications: RCS w/ BTL and scar revision                Principal Problem:   Postpartum care following cesarean delivery (9/8)  Doing well, stable.               Regular diet as tolerated D/C TED hose Ambulate Routine post-op care  Graceann Congress, MSN, CNM 01/31/2014, 10:07 AM

## 2014-01-31 NOTE — Addendum Note (Signed)
Addendum created 01/31/14 0930 by Talbot Grumbling, CRNA   Modules edited: Notes Section   Notes Section:  File: [id number]

## 2014-01-31 NOTE — Anesthesia Postprocedure Evaluation (Signed)
Anesthesia Post Note  Patient: Jamie Nelson  Procedure(s) Performed: Procedure(s) (LRB): Repeat CESAREAN SECTION WITH TUBAL LIGATION (Bilateral) SCAR REVISION (N/A)  Anesthesia type: SAB  Patient location: Mother/Baby  Post pain: Pain level controlled  Post assessment: Post-op Vital signs reviewed  Last Vitals:  Filed Vitals:   01/31/14 0640  BP: 110/53  Pulse: 66  Temp: 36.6 C  Resp: 20    Post vital signs: Reviewed  Level of consciousness: awake  Complications: No apparent anesthesia complications

## 2014-02-01 MED ORDER — IBUPROFEN 600 MG PO TABS: 600.0000 mg | ORAL_TABLET | Freq: Four times a day (QID) | ORAL | Status: DC

## 2014-02-01 MED ORDER — OXYCODONE-ACETAMINOPHEN 5-325 MG PO TABS: 1.0000 | ORAL_TABLET | ORAL | Status: DC | PRN

## 2014-02-01 NOTE — Discharge Summary (Signed)
POSTOPERATIVE DISCHARGE SUMMARY:  Patient ID: Jamie Nelson MRN: [id number] DOB/AGE: Nov 06, 1980 33 y.o.  Admit date: 01/30/2014 Admission Diagnoses: 39.[redacted] weeks gestation, previous cesarean section   Discharge date: 02/01/2014 Discharge Diagnoses: S/P Repeat C/S on 01/30/14        Prenatal history: G3P3003   EDC: 02/03/2014, by Last Menstrual Period  Has received prenatal care at [facility] since [redacted] wks gestation. Primary provider: Artelia Laroche, CNM Prenatal course complicated by previous CS, previous child with Trisomy 37, LGA, & excessive weight gain  Prenatal labs: ABO, Rh: --/--/O POS (09/08 1350)  Antibody: NEG (09/08 1350) Rubella:   / Immune RPR: NON REAC (09/04 0930)  HBsAg:   Neg HIV:   Neg GBS:   Neg GTT: 145/ normal home glucose monitoring  Medical / Surgical History :  Past medical history:  Past Medical History  Diagnosis Date  . ALLERGIC RHINITIS   . ARTHROSCOPY, KNEE, HX OF   . HEPATIC CYST   . WISDOM TEETH EXTRACTION, HX OF   . Seasonal allergies   . ANXIETY     hx - no meds  . DEPRESSION     hx - no meds  . S/P cesarean section (10/4) 02/26/2012  . Postpartum care following cesarean delivery 02/26/2012    Past surgical history:  Past Surgical History  Procedure Laterality Date  . Cesarean section  09/2008  . Benign liver cyst removed  1988  . Knee arthroscopy      left   . Wisdom teeth extraction  2004  . Cesarean section  02/26/2012    Procedure: CESAREAN SECTION;  Surgeon: Claiborne Billings A. Pamala Hurry, MD;  Location: [facility];  Service: Obstetrics;  Laterality: N/A;  EDD: 02/27/12  . Cesarean section with bilateral tubal ligation Bilateral 01/30/2014    Procedure: Repeat CESAREAN SECTION WITH TUBAL LIGATION;  Surgeon: Claiborne Billings A. Pamala Hurry, MD;  Location: [facility];  Service: Obstetrics;  Laterality: Bilateral;  EDD: 02/03/14  . Scar revision N/A 01/30/2014    Procedure: SCAR REVISION;  Surgeon: Claiborne Billings A. Pamala Hurry, MD;  Location: [facility];  Service:  Obstetrics;  Laterality: N/A;     Medications on Admission: No prescriptions prior to admission    Allergies: Nitrofurantoin   Intrapartum Course:  Admitted for repeat CS   Postpartum Course: Uncomplicated  Physical Exam:   VSS: Blood pressure 115/67, pulse 78, temperature 98.2 F (36.8 C), temperature source Oral, resp. rate 16, weight 97.977 kg (216 lb), last menstrual period 04/29/2013, SpO2 98.00%, unknown if currently breastfeeding.  LABS:  Recent Labs  01/31/14 0600  WBC 12.4*  HGB 12.0  PLT 135*    General: Alert and oriented x3 Heart: RRR Lungs: CTA bilaterally GI: soft, non-tender, non-distended, BS x4 Lochia: small Uterus: firm below umbilicus Incision: well approximated with suture, honeycomb dressing, no significant erythema, drainage, or edema Extremities: trace edema BLE, Homans neg   Newborn Data Live born female  Birth Weight: 8 lb 7.6 oz (3845 g) APGAR: 8, 9  See operative report for further details  Home with mother.  Discharge Instructions:  Wound Care: keep clean and dry / remove honeycomb POD 5 Postpartum Instructions: [facility] discharge booklet - instructions reviewed Medications:    Medication List         ibuprofen 600 MG tablet  Commonly known as:  ADVIL,MOTRIN  Take 1 tablet (600 mg total) by mouth every 6 (six) hours.     oxyCODONE-acetaminophen 5-325 MG per tablet  Commonly known as:  PERCOCET/ROXICET  Take  1-2 tablets by mouth every 4 (four) hours as needed (for pain scale equal to or greater than 7).     Pramoxine-HC 1-2.5 % Oint  Apply 1 application topically as needed (hemorrhoids).     prenatal multivitamin Tabs tablet  Take 1 tablet by mouth daily at 12 noon.            Follow-up Information   Follow up with Artelia Laroche, CNM. Schedule an appointment as soon as possible for a visit in 6 weeks.   Specialty:  Obstetrics and Gynecology   Contact information:   [address] [place]  [address] [phone]         Signed: Julianne Handler, Delane Ginger MSN, CNM 02/01/2014, 1:25 PM

## 2014-02-01 NOTE — Progress Notes (Signed)
Clinical Social Work Department PSYCHOSOCIAL ASSESSMENT - MATERNAL/CHILD 02/01/2014  Patient:  Jamie Nelson, Jamie Nelson  Account Number:  [id number]  Admit Date:  01/30/2014  Ardine Eng Name:   Verna Czech   Clinical Social Worker:  Lucita Ferrara, CLINICAL SOCIAL WORKER   Date/Time:  02/01/2014 09:45 AM  Date Referred:  01/30/2014   Referral source  Central Nursery     Referred reason  Depression/Anxiety   Other referral source:    I:  FAMILY / HOME ENVIRONMENT Child's legal guardian:  PARENT  Guardian - Name Guardian - Age Guardian - Address  Jamie Nelson 33 [address] [place], [place] [address]  Sherlynn Carbon  same as above   Other household support members/support persons Name Relationship DOB  Thornton Papas 12 years old  65 DAUGHTER 21 years old   Other support:   MOB and FOB shared belief that they are well supported by their family and friends.    II  PSYCHOSOCIAL DATA Information Source:  Family Interview  Occupational hygienist Employment:   MOB and FOB are both employed within [employer].  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   Financial resources:  Multimedia programmer If Golf:    School / Grade:  N/A Music therapist / Child Services Coordination / Early Interventions:   N/A  Cultural issues impacting care:   None reported.    III  STRENGTHS Strengths  Adequate Resources  Home prepared for Child (including basic supplies)  Supportive family/friends   Strength comment:    IV  RISK FACTORS AND CURRENT PROBLEMS Current Problem:  YES   Risk Factor & Current Problem Patient Issue Family Issue Risk Factor / Current Problem Comment  Mental Illness Y N MOB presented with history of anxiety and depression.  She stated that symptoms began following the birth of her son.  She reported history of Cymbalta and Wellbutrin, but denied current medication.  MOB has no plans to  re-start medication at this time but is aware of resources if needed.    V  SOCIAL WORK ASSESSMENT CSW met with MOB and FOB in MOB's room in order to complete the assessment. Consult ordered due to MOB's history of anxiety, depression, and postpartum depression.  MOB and FOB were easily engaged and pleasant throughout the assessment.  MOB displayed appropriate range in affect and was in a pleasant mood.  MOB and FOB presented with self-awareness of MOB's prior mental health symptoms and are prepared to confront symptoms of postpartum depression if they occur.  CSW was thanked by [facility] and FOB for the visit.  MOB did not present with any acute symptoms.   MOB and FOB discussed belief that they are well supported at home by their family members, and expressed excitement about transitioning home.  MOB and FOB did acknowledge recent stress as they are currently building a new home and they had to identify a new school for their 58 year old son who has Downs Syndrome.  She stated that they are living with her parents as they wait for their home to finish, but continued to express excitement related to Caroline's birth.    CSW reviewed mental health history.  MOB shared that she experienced symptoms for the first time following the birth of her 21 year old son.  MOB and FOB processed and recalled events, including the loss they felt following the 34 year old's birth.  MOB and FOB  recalled normal pregnancy, but discussed the adjustment to their "new normal" since they learned shortly after he was born that he has Downs Syndrome and MOB was unable to breast feed due to low milk production.  CSW validated their feelings of grief and loss, and normalized the anxiety associated with feeling life her life is out of her control.  CSW explored with MOB and FOB how they were able to adjust to their new normal.  MOB shared that medications assisted her and discussed that her support system of her family and the [place] were crucial for her.  MOB stated that she experienced postpartum depression again following the birth of her 74 year old daughter, but shared that it was milder.  MOB discussed re-starting medications in the 3rd month postpartum, with symptoms stabilization.  MOB denied intention to re-start medication at this time, despite her awareness of pattern of developing symptoms in her 3rd month.  She stated that she will be working with a Doula and that she knows how to access medication for symptoms if warranted.  MOB presented with awareness that postpartum depression can occur to anyone, and she did not present with any barriers that would may limit her desire to seek out help.  MOB verbalized on numerous occassions the importance of re-starting medications if warranted since she is aware how untreated symptoms negatively impact her ability to care for herself and her children.   No barriers to discharge.  VI SOCIAL WORK PLAN Social Work Therapist, art  No Further Intervention Required / No Barriers to Discharge   Type of pt/family education:   Postpartum depression   If child protective services report - county:   If child protective services report - date:   Information/referral to community resources comment:   Other social work plan:   CSW to provide ongoing emotional support PRN.

## 2014-02-01 NOTE — Lactation Note (Signed)
This note was copied from the chart of Jamie Indyah Alvelo. Lactation Consultation Note  Patient Name: Jamie Nelson MOQHU'T Date: 02/01/2014 Reason for consult: Follow-up assessment Mom has history of LMS with 2 older children so she has been supplementing using starter SNS with feedings. Mom reports her history to be that she has good colostrum the 1st few days but her milk has never come in well by day 5 or thereafter. She pumped and tried many supplements in the past but reports she did not feel this helped her milk supply. Parents report they use SNS without assist. LC demonstrated a double SNS for parents to use if they continue to use this method to supplement as the starter is to be used for 24 hours. Baby recently fed but was awake so Mom latched baby at this visit without SNS. Baby sleepy but suckled demonstrating nutritive and non-nutritive suckling. Parents have guidelines for supplementing. LC advised to monitor void/stools. Increase supplement as needed to minimize weight loss, for adequate output, and to satisfy baby appetite. Advised Mom of supplements to use this time to maximize milk production. Offered OP f/u, Mom advised she will call. Advised of support group.   Maternal Data    Feeding Feeding Type: Breast Fed Length of feed: 15 min  LATCH Score/Interventions Latch: Grasps breast easily, tongue down, lips flanged, rhythmical sucking.  Audible Swallowing: None  Type of Nipple: Everted at rest and after stimulation  Comfort (Breast/Nipple): Soft / non-tender     Hold (Positioning): No assistance needed to correctly position infant at breast. Intervention(s): Breastfeeding basics reviewed;Support Pillows;Position options;Skin to skin  LATCH Score: 8  Lactation Tools Discussed/Used     Consult Status Consult Status: Complete    Katrine Coho 02/01/2014, 11:19 AM

## 2014-02-01 NOTE — Progress Notes (Signed)
POD #2  Subjective: Pt reports feeling well, desires early discharge/ Pain controlled with Motrin and Percocet Tolerating po/Voiding without problems/ No n/v/ Flatus present Activity: ad lib Bleeding is light Newborn info:  Information for the patient's newborn:  Syleena, Mchan [[id number]]  female Feeding: breast   Objective: VS: VS:  Filed Vitals:   01/31/14 1030 01/31/14 1430 01/31/14 1745 02/01/14 0530  BP: 112/62 123/71 117/62 115/67  Pulse: 72 66 59 78  Temp: 98 F (36.7 C) 98.2 F (36.8 C) 98.4 F (36.9 C) 98.2 F (36.8 C)  TempSrc: Oral Oral Oral Oral  Resp: 20 20 18 16   Weight:      SpO2: 97% 96%  98%    I&O: Intake/Output     09/09 0701 - 09/10 0700 09/10 0701 - 09/11 0700   I.V. (mL/kg)     Total Intake(mL/kg)     Urine (mL/kg/hr) 850 (0.4)    Blood     Total Output 850     Net -850            LABS:  Recent Labs  01/31/14 0600  WBC 12.4*  HGB 12.0  PLT 135*                           Physical Exam:  General: alert and cooperative CV: Regular rate and rhythm Resp: CTA bilaterally Abdomen: soft, nontender, normal bowel sounds Incision: healing well, no drainage, no erythema, no hernia, no seroma, no swelling, well approximated, honeycomb dsg c/d/i Uterine Fundus: firm, below umbilicus, nontender Lochia: minimal Ext: edema trace BLE and Homans sign is negative, no sign of DVT    Assessment: POD # 2/ G3P3003/ S/P C/Section and BTL d/t repeat  Doing well and stable for discharge home  Plan: Discharge home RX's: Ibuprofen 600mg  po Q 6 hrs prn pain #30 Refill x 1 Percocet 5/325 1 - 2 tabs po every 4 hrs prn pain #30 Refill x 0 [facility] booklet given    Signed: Graciela Husbands, MSN, CNM 02/01/2014, 9:25 AM

## 2014-03-26 ENCOUNTER — Encounter (HOSPITAL_COMMUNITY): Payer: Self-pay | Admitting: Obstetrics

## 2014-03-30 ENCOUNTER — Emergency Department (HOSPITAL_COMMUNITY)
Admission: EM | Admit: 2014-03-30 | Discharge: 2014-03-30 | Disposition: A | Discharge status: Home / Self Care | Payer: 59 | Source: Home / Self Care | Attending: Family Medicine | Admitting: Family Medicine

## 2014-03-30 ENCOUNTER — Encounter (HOSPITAL_COMMUNITY): Payer: Self-pay | Admitting: Emergency Medicine

## 2014-03-30 DIAGNOSIS — N390 Urinary tract infection, site not specified: Secondary | ICD-10-CM

## 2014-03-30 LAB — POCT URINALYSIS DIP (DEVICE)
Bilirubin Urine: NEGATIVE
Glucose, UA: NEGATIVE mg/dL
HGB URINE DIPSTICK: NEGATIVE
Ketones, ur: NEGATIVE mg/dL
NITRITE: NEGATIVE
PROTEIN: NEGATIVE mg/dL
Specific Gravity, Urine: 1.01 (ref 1.005–1.030)
Urobilinogen, UA: 0.2 mg/dL (ref 0.0–1.0)
pH: 6.5 (ref 5.0–8.0)

## 2014-03-30 LAB — POCT PREGNANCY, URINE: PREG TEST UR: NEGATIVE

## 2014-03-30 MED ORDER — CIPROFLOXACIN HCL 250 MG PO TABS: 250.0000 mg | ORAL_TABLET | Freq: Two times a day (BID) | ORAL | Status: DC

## 2014-03-30 NOTE — ED Provider Notes (Addendum)
CSN: [id number]     Arrival date & time 03/30/14  1027 History   First MD Initiated Contact with Patient 03/30/14 1100     Chief Complaint  Patient presents with  . Urinary Tract Infection   (Consider location/radiation/quality/duration/timing/severity/associated sxs/prior Treatment) HPI      33 year old female presents complaining of possible urinary tract infection. She has urinary frequency and urgency with slight dysuria for about 4 days. She denies any abdominal pain, flank pain, NVD, fever, chills. She is 2 months postpartum, she had a C-section and she did have a bladder catheter placed. she has been trying to treat this at home by increasing her oral fluid intake. no history of hypoglycemia or gestational diabetes.  Past Medical History  Diagnosis Date  . ALLERGIC RHINITIS   . ARTHROSCOPY, KNEE, HX OF   . HEPATIC CYST   . WISDOM TEETH EXTRACTION, HX OF   . Seasonal allergies   . ANXIETY     hx - no meds  . DEPRESSION     hx - no meds  . S/P cesarean section (10/4) 02/26/2012  . Postpartum care following cesarean delivery 02/26/2012   Past Surgical History  Procedure Laterality Date  . Cesarean section  09/2008  . Benign liver cyst removed  1988  . Knee arthroscopy      left   . Wisdom teeth extraction  2004  . Cesarean section  02/26/2012    Procedure: CESAREAN SECTION;  Surgeon: Claiborne Billings A. Pamala Hurry, MD;  Location: [facility];  Service: Obstetrics;  Laterality: N/A;  EDD: 02/27/12  . Cesarean section with bilateral tubal ligation Bilateral 01/30/2014    Procedure: Repeat CESAREAN SECTION WITH TUBAL LIGATION;  Surgeon: Claiborne Billings A. Pamala Hurry, MD;  Location: [facility];  Service: Obstetrics;  Laterality: Bilateral;  EDD: 02/03/14  . Scar revision N/A 01/30/2014    Procedure: SCAR REVISION;  Surgeon: Claiborne Billings A. Pamala Hurry, MD;  Location: [facility];  Service: Obstetrics;  Laterality: N/A;   Family History  Problem Relation Age of Onset  . Breast cancer Other   . Colon cancer Other     parent,  grandparent  . Diabetes Other     grandparent  . Hypertension Other     parent  . Lung cancer Other   . Cancer Maternal Aunt     Breast  . Cancer Maternal Grandmother     Lung  . Stroke Paternal Grandmother   . Stroke Paternal Grandfather    History  Substance Use Topics  . Smoking status: Never Smoker   . Smokeless tobacco: Never Used     Comment: Married, lives with spouse Herbie Baltimore who is Chief Executive Officer of hospitalist service) and son Barnabas Lister who has down symdrome  . Alcohol Use: Yes     Comment: socially but none with pregnancy   OB History    Gravida Para Term Preterm AB TAB SAB Ectopic Multiple Living   3 3 3       3      Review of Systems  Genitourinary: Positive for dysuria, urgency and frequency. Negative for hematuria, flank pain, vaginal bleeding, vaginal discharge and pelvic pain.  All other systems reviewed and are negative.   Allergies  Nitrofurantoin  Home Medications   Prior to Admission medications   Medication Sig Start Date End Date Taking? Authorizing Provider  Prenatal Vit-Fe Fumarate-FA (PRENATAL MULTIVITAMIN) TABS tablet Take 1 tablet by mouth daily at 12 noon.   Yes Historical Provider, MD  cefUROXime (CEFTIN) 250 MG tablet Take 1 tablet (250  mg total) by mouth 2 (two) times daily with a meal. 03/31/14   Liam Graham, PA-C  ciprofloxacin (CIPRO) 250 MG tablet Take 1 tablet (250 mg total) by mouth every 12 (twelve) hours. 03/30/14   Liam Graham, PA-C  ibuprofen (ADVIL,MOTRIN) 600 MG tablet Take 1 tablet (600 mg total) by mouth every 6 (six) hours. 02/01/14   Graciela Husbands, CNM  oxyCODONE-acetaminophen (PERCOCET/ROXICET) 5-325 MG per tablet Take 1-2 tablets by mouth every 4 (four) hours as needed (for pain scale equal to or greater than 7). 02/01/14   Graciela Husbands, CNM  Pramoxine-HC 1-2.5 % OINT Apply 1 application topically as needed (hemorrhoids).    Historical Provider, MD   BP 136/82 mmHg  Pulse 63  Temp(Src) 97.8 F (36.6 C) (Oral)   Resp 16  SpO2 99%  LMP 04/29/2013 Physical Exam  Constitutional: She is oriented to person, place, and time. Vital signs are normal. She appears well-developed and well-nourished. No distress.  HENT:  Head: Normocephalic and atraumatic.  Pulmonary/Chest: Effort normal. No respiratory distress.  Abdominal: Normal appearance. There is no tenderness. There is no rigidity, no rebound, no guarding, no CVA tenderness, no tenderness at McBurney's point and negative Murphy's sign.  Neurological: She is alert and oriented to person, place, and time. She has normal strength. Coordination normal.  Skin: Skin is warm and dry. No rash noted. She is not diaphoretic.  Psychiatric: She has a normal mood and affect. Judgment normal.  Nursing note and vitals reviewed.   ED Course  Procedures (including critical care time) Labs Review Labs Reviewed  POCT URINALYSIS DIP (DEVICE) - Abnormal; Notable for the following:    Leukocytes, UA TRACE (*)    All other components within normal limits  URINE CULTURE  POCT PREGNANCY, URINE    Imaging Review No results found.   MDM   1. UTI (lower urinary tract infection)    Urinalysis is not conclusive. However, given her recent hospitalization, will treat for UTI with Cipro. Urine culture sent. Follow-up when necessary  Meds ordered this encounter  Medications  . ciprofloxacin (CIPRO) 250 MG tablet    Sig: Take 1 tablet (250 mg total) by mouth every 12 (twelve) hours.    Dispense:  14 tablet    Refill:  0    Order Specific Question:  Supervising Provider    Answer:  Billy Fischer [phone]  . cefUROXime (CEFTIN) 250 MG tablet    Sig: Take 1 tablet (250 mg total) by mouth 2 (two) times daily with a meal.    Dispense:  10 tablet    Refill:  0    Order Specific Question:  Supervising Provider    Answer:  Lynne Leader, [place]     Pt called back, cipro causing stomach pain.  Called in ceftin, f/u if stomach pains do not resolve.  Discussed sxs of  pyelo with pt, she will go to ED if she develops those despite the ABx.     Liam Graham, PA-C 03/30/14 [place] Loyal Holzheimer, PA-C 03/31/14 1431

## 2014-03-30 NOTE — Discharge Instructions (Signed)

## 2014-03-31 MED ORDER — CEFUROXIME AXETIL 250 MG PO TABS: 250.0000 mg | ORAL_TABLET | Freq: Two times a day (BID) | ORAL | Status: DC

## 2014-04-02 LAB — URINE CULTURE: Colony Count: 50000

## 2014-04-02 NOTE — ED Notes (Signed)
Urine culture: 50,000 colonies E.Coli.  Pt. treated with Cipro initially.  It was causing stomach pain and was changed to Ceftin on 11/7.  Pt. is  to callback if any further problems. Jamie Nelson 04/02/2014

## 2014-07-11 ENCOUNTER — Encounter: Payer: Self-pay | Admitting: Internal Medicine

## 2014-07-11 ENCOUNTER — Ambulatory Visit (INDEPENDENT_AMBULATORY_CARE_PROVIDER_SITE_OTHER): Payer: 59 | Admitting: Internal Medicine

## 2014-07-11 VITALS — BP 128/82 | HR 90 | Temp 97.6°F | Resp 16 | Ht 65.0 in | Wt 207.0 lb

## 2014-07-11 DIAGNOSIS — Z0000 Encounter for general adult medical examination without abnormal findings: Secondary | ICD-10-CM | POA: Insufficient documentation

## 2014-07-11 DIAGNOSIS — F32A Depression, unspecified: Secondary | ICD-10-CM

## 2014-07-11 DIAGNOSIS — R635 Abnormal weight gain: Secondary | ICD-10-CM

## 2014-07-11 DIAGNOSIS — F329 Major depressive disorder, single episode, unspecified: Secondary | ICD-10-CM

## 2014-07-11 MED ORDER — BUPROPION HCL ER (XL) 150 MG PO TB24: 150.0000 mg | ORAL_TABLET | Freq: Every day | ORAL | Status: DC

## 2014-07-11 NOTE — Assessment & Plan Note (Signed)
Pap smear up to date. Flu and tetanus up to date. Treating for some mild depression. Non-smoker. Advised to return to exercise 2-3 times weekly and given stress management information.

## 2014-07-11 NOTE — Progress Notes (Signed)
Pre visit review using our clinic review tool, if applicable. No additional management support is needed unless otherwise documented below in the visit note. 

## 2014-07-11 NOTE — Assessment & Plan Note (Signed)
She has done well with wellbutrin in the past and will re-prescribe. If she has any problems or questions she knows to call.

## 2014-07-11 NOTE — Progress Notes (Signed)
   Subjective:    Patient ID: Jamie Nelson, female    DOB: 1981-02-08, 34 y.o.   MRN: [id number]  HPI The patient is a 34 YO female who comes in today for wellness. She has a newborn (5 month girl) and two other children less than 5 (including one other child with down's syndrome). She is having some depression and have quick mood changes and low patience. She did well with wellbutrin after her second child. She did try cymbalta and had some problems with libido and weight gain. She is having some difficulty losing weight after the pregnancy and has not been exercising as much. She is not longer breastfeeding.   PM[facility], [place], [facility] reviewed and updated today.   Review of Systems  Constitutional: Negative for fever, activity change, appetite change, fatigue and unexpected weight change.  HENT: Negative.   Eyes: Negative.   Respiratory: Negative for cough, chest tightness, shortness of breath and wheezing.   Cardiovascular: Negative for chest pain, palpitations and leg swelling.  Gastrointestinal: Negative for abdominal pain, diarrhea, constipation and abdominal distention.  Endocrine: Negative.   Musculoskeletal: Negative.   Skin: Negative.   Neurological: Negative.   Psychiatric/Behavioral: Positive for sleep disturbance and dysphoric mood. Negative for behavioral problems and agitation. The patient is nervous/anxious. The patient is not hyperactive.       Objective:   Physical Exam  Constitutional: She is oriented to person, place, and time. She appears well-developed and well-nourished.  HENT:  Head: Normocephalic and atraumatic.  Right Ear: External ear normal.  Left Ear: External ear normal.  Nose: Nose normal.  Mouth/Throat: Oropharynx is clear and moist.  Eyes: EOM are normal.  Neck: Normal range of motion.  Cardiovascular: Normal rate and regular rhythm.   Pulmonary/Chest: Effort normal and breath sounds normal. No respiratory distress. She has no wheezes. She has no rales.    Abdominal: Soft. Bowel sounds are normal. She exhibits no distension. There is no tenderness.  Musculoskeletal: She exhibits no edema.  Neurological: She is alert and oriented to person, place, and time. Coordination normal.  Skin: Skin is warm and dry.  Psychiatric: She has a normal mood and affect.   Filed Vitals:   07/11/14 0939  BP: 128/82  Pulse: 90  Temp: 97.6 F (36.4 C)  TempSrc: Oral  Resp: 16  Height: 5\' 5"  (1.651 m)  Weight: 207 lb (93.895 kg)  SpO2: 99%      Assessment & Plan:

## 2014-07-11 NOTE — Patient Instructions (Signed)
We will check on the thyroid and the kidneys.   We have sent in the wellbutrin which should help to smooth out the moods. If you have any problems with the medicine or side effects please feel free to call us back for advice.  Come back in 1-2 years for a physical. If you have any problems sooner please feel free to call the office.   Stress and Stress Management Stress is a normal reaction to life events. It is what you feel when life demands more than you are used to or more than you can handle. Some stress can be useful. For example, the stress reaction can help you catch the last bus of the day, study for a test, or meet a deadline at work. But stress that occurs too often or for too long can cause problems. It can affect your emotional health and interfere with relationships and normal daily activities. Too much stress can weaken your immune system and increase your risk for physical illness. If you already have a medical problem, stress can make it worse. CAUSES  All sorts of life events may cause stress. An event that causes stress for one person may not be stressful for another person. Major life events commonly cause stress. These may be positive or negative. Examples include losing your job, moving into a new home, getting married, having a baby, or losing a loved one. Less obvious life events may also cause stress, especially if they occur day after day or in combination. Examples include working long hours, driving in traffic, caring for children, being in debt, or being in a difficult relationship. SIGNS AND SYMPTOMS Stress may cause emotional symptoms including, the following:  Anxiety. This is feeling worried, afraid, on edge, overwhelmed, or out of control.  Anger. This is feeling irritated or impatient.  Depression. This is feeling sad, down, helpless, or guilty.  Difficulty focusing, remembering, or making decisions. Stress may cause physical symptoms, including the following:    Aches and pains. These may affect your head, neck, back, stomach, or other areas of your body.  Tight muscles or clenched jaw.  Low energy or trouble sleeping. Stress may cause unhealthy behaviors, including the following:   Eating to feel better (overeating) or skipping meals.  Sleeping too little, too much, or both.  Working too much or putting off tasks (procrastination).  Smoking, drinking alcohol, or using drugs to feel better. DIAGNOSIS  Stress is diagnosed through an assessment by your health care provider. Your health care provider will ask questions about your symptoms and any stressful life events.Your health care provider will also ask about your medical history and may order blood tests or other tests. Certain medical conditions and medicine can cause physical symptoms similar to stress. Mental illness can cause emotional symptoms and unhealthy behaviors similar to stress. Your health care provider may refer you to a mental health professional for further evaluation.  TREATMENT  Stress management is the recommended treatment for stress.The goals of stress management are reducing stressful life events and coping with stress in healthy ways.  Techniques for reducing stressful life events include the following:  Stress identification. Self-monitor for stress and identify what causes stress for you. These skills may help you to avoid some stressful events.  Time management. Set your priorities, keep a calendar of events, and learn to say "no." These tools can help you avoid making too many commitments. Techniques for coping with stress include the following:  Rethinking the problem. Try to  think realistically about stressful events rather than ignoring them or overreacting. Try to find the positives in a stressful situation rather than focusing on the negatives.  Exercise. Physical exercise can release both physical and emotional tension. The key is to find a form of  exercise you enjoy and do it regularly.  Relaxation techniques. These relax the body and mind. Examples include yoga, meditation, tai chi, biofeedback, deep breathing, progressive muscle relaxation, listening to music, being out in nature, journaling, and other hobbies. Again, the key is to find one or more that you enjoy and can do regularly.  Healthy lifestyle. Eat a balanced diet, get plenty of sleep, and do not smoke. Avoid using alcohol or drugs to relax.  Strong support network. Spend time with family, friends, or other people you enjoy being around.Express your feelings and talk things over with someone you trust. Counseling or talktherapy with a mental health professional may be helpful if you are having difficulty managing stress on your own. Medicine is typically not recommended for the treatment of stress.Talk to your health care provider if you think you need medicine for symptoms of stress. HOME CARE INSTRUCTIONS  Keep all follow-up visits as directed by your health care provider.  Take all medicines as directed by your health care provider. SEEK MEDICAL CARE IF:  Your symptoms get worse or you start having new symptoms.  You feel overwhelmed by your problems and can no longer manage them on your own. SEEK IMMEDIATE MEDICAL CARE IF:  You feel like hurting yourself or someone else. Document Released: 11/04/2000 Document Revised: 09/25/2013 Document Reviewed: 01/03/2013 [facility] Patient Information 2015 [place], [place]. This information is not intended to replace advice given to you by your health care provider. Make sure you discuss any questions you have with your health care provider.

## 2014-07-13 ENCOUNTER — Other Ambulatory Visit (INDEPENDENT_AMBULATORY_CARE_PROVIDER_SITE_OTHER): Payer: 59

## 2014-07-13 DIAGNOSIS — R635 Abnormal weight gain: Secondary | ICD-10-CM

## 2014-07-13 LAB — BASIC METABOLIC PANEL
BUN: 15 mg/dL (ref 6–23)
CALCIUM: 9.1 mg/dL (ref 8.4–10.5)
CO2: 25 mEq/L (ref 19–32)
CREATININE: 0.82 mg/dL (ref 0.40–1.20)
Chloride: 104 mEq/L (ref 96–112)
GFR: 84.35 mL/min (ref >60.00)
Glucose, Bld: 79 mg/dL (ref 70–99)
Potassium: 4.4 mEq/L (ref 3.5–5.1)
Sodium: 136 mEq/L (ref 135–145)

## 2014-07-13 LAB — T4, FREE: Free T4: 0.77 ng/dL (ref 0.60–1.60)

## 2014-07-13 LAB — TSH: TSH: 1 u[IU]/mL (ref 0.35–4.50)

## 2015-07-03 MED FILL — SPIRONOLACTONE 50 MG TABLET: 50 | 30 days supply | Qty: 30 | Fill #0

## 2015-08-26 MED FILL — SPIRONOLACTONE 50 MG TABLET: 50 | 30 days supply | Qty: 30 | Fill #0

## 2015-09-30 DIAGNOSIS — R635 Abnormal weight gain: Secondary | ICD-10-CM | POA: Diagnosis not present

## 2015-09-30 DIAGNOSIS — E282 Polycystic ovarian syndrome: Secondary | ICD-10-CM | POA: Diagnosis not present

## 2015-10-14 DIAGNOSIS — E278 Other specified disorders of adrenal gland: Secondary | ICD-10-CM | POA: Diagnosis not present

## 2015-11-14 MED FILL — SPIRONOLACTONE 50 MG TABLET: 50 | 30 days supply | Qty: 30 | Fill #0

## 2015-12-13 MED FILL — SPIRONOLACTONE 50 MG TABLET: 50 | 30 days supply | Qty: 30 | Fill #0

## 2016-01-06 DIAGNOSIS — H5213 Myopia, bilateral: Secondary | ICD-10-CM | POA: Diagnosis not present

## 2016-01-06 DIAGNOSIS — H52223 Regular astigmatism, bilateral: Secondary | ICD-10-CM | POA: Diagnosis not present

## 2016-01-28 MED FILL — SPIRONOLACTONE 50 MG TABLET: 50 | 30 days supply | Qty: 30 | Fill #0

## 2016-03-17 MED FILL — SPIRONOLACTONE 50 MG TABLET: 50 | 30 days supply | Qty: 30 | Fill #1

## 2016-03-30 ENCOUNTER — Ambulatory Visit (INDEPENDENT_AMBULATORY_CARE_PROVIDER_SITE_OTHER): Payer: 59 | Admitting: Internal Medicine

## 2016-03-30 ENCOUNTER — Encounter: Payer: Self-pay | Admitting: Internal Medicine

## 2016-03-30 DIAGNOSIS — E282 Polycystic ovarian syndrome: Secondary | ICD-10-CM | POA: Insufficient documentation

## 2016-03-30 LAB — COMPLETE METABOLIC PANEL WITH GFR
ALBUMIN: 4.3 g/dL (ref 3.6–5.1)
ALT: 29 U/L (ref 6–29)
AST: 36 U/L — AB (ref 10–30)
Alkaline Phosphatase: 66 U/L (ref 33–115)
BUN: 11 mg/dL (ref 7–25)
CO2: 22 mmol/L (ref 20–31)
Calcium: 9.2 mg/dL (ref 8.6–10.2)
Chloride: 104 mmol/L (ref 98–110)
Creat: 0.77 mg/dL (ref 0.50–1.10)
GFR, Est African American: >89 mL/min (ref >=60)
GLUCOSE: 76 mg/dL (ref 65–99)
POTASSIUM: 4 mmol/L (ref 3.5–5.3)
SODIUM: 138 mmol/L (ref 135–146)
TOTAL PROTEIN: 6.8 g/dL (ref 6.1–8.1)
Total Bilirubin: 0.7 mg/dL (ref 0.2–1.2)

## 2016-03-30 LAB — TSH: TSH: 0.84 u[IU]/mL (ref 0.35–4.50)

## 2016-03-30 LAB — HEMOGLOBIN A1C: HEMOGLOBIN A1C: 5 % (ref 4.6–6.5)

## 2016-03-30 LAB — T3, FREE: T3 FREE: 3.8 pg/mL (ref 2.3–4.2)

## 2016-03-30 LAB — LIPID PANEL
CHOLESTEROL: 207 mg/dL — AB (ref 0–200)
HDL: 74.3 mg/dL (ref >39.00)
LDL CALC: 111 mg/dL — AB (ref 0–99)
NONHDL: 132.26
Total CHOL/HDL Ratio: 3
Triglycerides: 107 mg/dL (ref 0.0–149.0)
VLDL: 21.4 mg/dL (ref 0.0–40.0)

## 2016-03-30 LAB — VITAMIN D 25 HYDROXY (VIT D DEFICIENCY, FRACTURES): VITD: 24.38 ng/mL — ABNORMAL LOW (ref 30.00–100.00)

## 2016-03-30 LAB — T4, FREE: Free T4: 0.8 ng/dL (ref 0.60–1.60)

## 2016-03-30 MED ORDER — SPIRONOLACTONE 50 MG PO TABS: 50.0000 mg | ORAL_TABLET | Freq: Two times a day (BID) | ORAL | 3 refills | Status: DC

## 2016-03-30 MED ORDER — METFORMIN HCL 500 MG PO TABS: 1000.0000 mg | ORAL_TABLET | Freq: Two times a day (BID) | ORAL | 3 refills | Status: DC

## 2016-03-30 MED FILL — metFORMIN HCL 500 MG TABS: 500 | 90 days supply | Qty: 360 | Fill #0

## 2016-03-30 MED FILL — SPIRONOLACTONE 50 MG TABLET: 50 | 90 days supply | Qty: 180 | Fill #0

## 2016-03-30 NOTE — Patient Instructions (Addendum)
Please increase Spironolactone to 100 mg a day.  Come back for a potassium check in 1 week.  Please start Metformin 500 mg with dinner x 4 days. If you tolerate this well, add another Metformin tablet (500 mg) with breakfast x 4 days. If you tolerate this well, add another metformin tablet with dinner (total 1000 mg) x 4 days. If you tolerate this well, add another metformin tablet with breakfast (total 1000 mg). Continue with 1000 mg of metformin 2x a day with breakfast and dinner.  Please stop at the lab.  Please look at the following website: [email or web address]  Please come back for a follow-up appointment in 6 months.

## 2016-03-30 NOTE — Progress Notes (Signed)
Patient ID: Jamie Nelson, female   DOB: 1980-06-06, 35 y.o.   MRN: [id number]   HPI: Jamie Nelson is a 35 y.o. female, referred by Artelia Laroche, CNM, for evaluation and management of PCOS, increased DHEAS.  Fertility/Menstrual cycles: - regular menses all her life - no h/o ovarian cysts - children: 3, one with Down syndrome - miscarriages: 0 - contraception: She had tubal ligation in 2013, was briefly ion OCPs in 2010  Acne: - face - on Spironolactone 50  - on Proactive  Hirsutism: - plucks it  Weight gain: - +, despite intense exercise, keeps a lot of weight in mid-section. Reviewing the chart, she is actually slowly losing weight - no steroid use - no weight loss meds - Meals: - Breakfast: Breakfast bar protein shake, banana - Lunch: [place] or week sandwich or grilled chicken salad - Dinner: Port or chicken, brown rice, veggies - Snacks: Cheese stick once or twice per day or fruit Drinks: No sodas no sweet drinks - Diets tried: Tried the "ideal protein diet" >> no success - Exercise: Running, she was training for marathon in June 2017; running 4 times a week  Treatments tried: - did not try Metformin - She is on Spironolactone 50 mg >> acne much better on this - did not try [place] - not on OCPs now   Reviewed pertinent labs her records from 10/14/2015: - Prolactin 7.64 - HbA1c 5.3% - A.m. cortisol 10.2 - LH 5, FSH 2 - Free testosterone 2.1 (0-4.2) - DHEAS 298.7 (57.3-279.2)  - Last thyroid tests: Lab Results  Component Value Date   TSH 1.00 07/13/2014   FREET4 0.77 07/13/2014   - Last set of lipids:    Component Value Date/Time   CHOL 204 (H) 06/26/2011 0918   TRIG 75.0 06/26/2011 0918   HDL 56.20 06/26/2011 0918   CHOLHDL 4 06/26/2011 0918   VLDL 15.0 06/26/2011 0918   She has no FH of DM.   She also has a vit D insufficiency.  ROS: Constitutional: + weight gain, +gue, no subjective hyperthermia/hypothermia Eyes: no blurry vision, no  xerophthalmia ENT: no sore throat, no nodules palpated in throat, no dysphagia/odynophagia, no hoarseness Cardiovascular: no CP/SOB/ + palpitations/no leg swelling Respiratory: no cough/SOB Gastrointestinal: no N/V/D/C  Musculoskeletal: no muscle/joint aches Skin: + acne, + hair on face Neurological: no tremors/numbness/tingling/dizziness, + headaches Psychiatric: + both: Depression/anxiety  Past Medical History:  Diagnosis Date  . ALLERGIC RHINITIS   . ANXIETY    hx - no meds  . ARTHROSCOPY, KNEE, HX OF   . DEPRESSION    hx - no meds  . HEPATIC CYST   . Postpartum care following cesarean delivery 02/26/2012  . S/P cesarean section (10/4) 02/26/2012  . Seasonal allergies   . WISDOM TEETH EXTRACTION, HX OF    Past Surgical History:  Procedure Laterality Date  . Benign liver cyst removed  1988  . CESAREAN SECTION  09/2008  . CESAREAN SECTION  02/26/2012   Procedure: CESAREAN SECTION;  Surgeon: Claiborne Billings A. Pamala Hurry, MD;  Location: [facility];  Service: Obstetrics;  Laterality: N/A;  EDD: 02/27/12  . CESAREAN SECTION WITH BILATERAL TUBAL LIGATION Bilateral 01/30/2014   Procedure: Repeat CESAREAN SECTION WITH TUBAL LIGATION;  Surgeon: Claiborne Billings A. Pamala Hurry, MD;  Location: [facility];  Service: Obstetrics;  Laterality: Bilateral;  EDD: 02/03/14  . KNEE ARTHROSCOPY     left   . SCAR REVISION N/A 01/30/2014   Procedure: SCAR REVISION;  Surgeon: Claiborne Billings A. Pamala Hurry, MD;  Location: [facility];  Service: Obstetrics;  Laterality: N/A;  . wisdom teeth extraction  2004   Social History   Social History  . Marital status: Married    Spouse name: N/A  . Number of children: 3   Occupational History  . RN   Social History Main Topics  . Smoking status: Never Smoker  . Smokeless tobacco: Never Used     Comment: Married, lives with spouse Herbie Baltimore who was Chief Executive Officer of hospitalist service)   . Alcohol use Yes     Comment: socially but none with pregnancy  . Drug use: No   Current Outpatient Prescriptions  on File Prior to Visit  Medication Sig Dispense Refill  . buPROPion (WELLBUTRIN XL) 150 MG 24 hr tablet Take 1 tablet (150 mg total) by mouth daily. (Patient not taking: Reported on 03/30/2016) 30 tablet 6  . Prenatal Vit-Fe Fumarate-FA (PRENATAL MULTIVITAMIN) TABS tablet Take 1 tablet by mouth daily at 12 noon.     No current facility-administered medications on file prior to visit.    Allergies  Allergen Reactions  . Nitrofurantoin     REACTION: Hives   Family History  Problem Relation Age of Onset  . Breast cancer Other   . Colon cancer Other     parent, grandparent  . Diabetes Other     grandparent  . Hypertension Other     parent  . Lung cancer Other   . Cancer Maternal Aunt     Breast  . Cancer Maternal Grandmother     Lung  . Stroke Paternal Grandmother   . Stroke Paternal Grandfather    PE: BP 124/84 (BP Location: Left Arm, Patient Position: Sitting)   Pulse 62   Wt 195 lb (88.5 kg)   LMP 03/13/2016   BMI 32.45 kg/m   Wt Readings from Last 3 Encounters:  03/30/16 195 lb (88.5 kg)  07/11/14 207 lb (93.9 kg)  01/30/14 216 lb (98 kg)   Constitutional: overweight, in NAD, no full supraclavicular fat pads Eyes: PERRLA, EOMI, no exophthalmos ENT: moist mucous membranes, no thyromegaly, no cervical lymphadenopathy Cardiovascular: RRR, No MRG Respiratory: CTA B Gastrointestinal: abdomen soft, NT, ND, BS+ Musculoskeletal: no deformities, strength intact in all 4 Skin: moist, warm; + Mild acne on face, + dark terminal hair on chin, + vellum on sideburns,no skin tags, no acanthosis nigricans, no purple, wide, stretch marks Neurological: no tremor with outstretched hands, DTR normal in all 4  ASSESSMENT: 1. PCOS  PLAN: 1.  - Patient with a history of mild PCOS, based on reportedly elevated testosterone level in the past, right before starting spironolactone. In support of this diagnosis, she also had difficulty losing weight despite being a runner, she had a high  LH-FSH ratio, elevated DHEAS, vellum on sideburns, acne and hirsutism on face. However, her menstrual cycles were always regular, she had no problems getting pregnant, and she had no ovarian cysts. We did discuss that the slightly height TSH level couldn't be obtained while pregnant or on OCPs, due to increased SHBG, but also at the time of ovulation. Since I do not have previous records, I am not sure when the testosterone level was drawn and if a total or of free testosterone level was found elevated at that time. A more recent testosterone level obtained 6 months ago was normal. Based on the above, we decided to consider her as having mild PCOS.  - I had a long discussion with the patient about the fact that the  PCOS is a misnomer, a patient does not necessarily have to have polycystic ovaries to be diagnosed with the disorder. This is of sum of several conditions, including:  weight gain  insulin resistance (and therefore a higher risk of developing diabetes later in life)  acne  hirsutism  irregular menstrual cycles  decreased fertility. - We also discussed about the fact that the treatment is usually targeted to addressing the problem that concerns the patient the most: acne/hirsutism, weight gain, or fertility, but there is no single treatment for PCOS.  - The first-line therapy are oral contraceptives. If  the patient is concerned with her weight, we can use metformin; if she is concerned about acne/hirsutism, use spironolactone; and if she is concerned about fertility, I could refer her to reproductive endocrinology for possible use of clomiphene. - In her case, she would like to avoid oral contraceptives  as she had a tubal ligation and would like to avoid exogenous hormones. Since she is bothered by her weight, will add metformin. Since she continues to be bothered by acne and hirsutism, we can increase her spironolactone. I advised her to check blood pressure at home or at work (she works  at [employer]) and will have her back for a potassium level checked in 1 week  - We also discussed about changing her diet and I suggested a plant-based diet. I also gave her references for this. - We will check the following labs today: Orders Placed This Encounter  Procedures  . VITAMIN D 25 Hydroxy (Vit-D Deficiency, Fractures)  . TSH  . T4, free  . T3, free  . DHEA-sulfate  . 17-Hydroxyprogesterone  . Testosterone, Free, Total, SHBG  . COMPLETE METABOLIC PANEL WITH GFR  . Lipid panel  . Hemoglobin A1c  - I would not repeat an LH, FSH, prolactin, since they were normal before.  - I will have her come back in 6 months for recheck   Patient Instructions  Please increase Spironolactone to 100 mg a day.  Come back for a potassium check in 1 week.  Please start Metformin 500 mg with dinner x 4 days. If you tolerate this well, add another Metformin tablet (500 mg) with breakfast x 4 days. If you tolerate this well, add another metformin tablet with dinner (total 1000 mg) x 4 days. If you tolerate this well, add another metformin tablet with breakfast (total 1000 mg). Continue with 1000 mg of metformin 2x a day with breakfast and dinner.  Please stop at the lab.  Please look at the following website: [email or web address]  Please come back for a follow-up appointment in 6 months.   - time spent with the patient: 1 hour, of which >50% was spent in obtaining information about her symptoms, reviewing her previous labs, evaluations, and treatments, counseling her about her condition (please see the discussed topics above), and developing a plan to further investigate it; she had a number of questions which I addressed.  Office Visit on 03/30/2016  Component Date Value Ref Range Status  . VITD 03/30/2016 24.38* 30.00 - 100.00 ng/mL Final  . TSH 03/30/2016 0.84  0.35 - 4.50 uIU/mL Final  . Free T4 03/30/2016 0.80  0.60 - 1.60 ng/dL Final   Comment: Specimens from patients who are  undergoing biotin therapy and /or ingesting biotin supplements may contain high levels of biotin.  The higher biotin concentration in these specimens interferes with this Free T4 assay.  Specimens that contain high levels  of biotin may  cause false high results for this Free T4 assay.  Please interpret results in light of the total clinical presentation of the patient.    . T3, Free 03/30/2016 3.8  2.3 - 4.2 pg/mL Final  . DHEA-SO4 03/31/2016 278* 23 - 266 ug/dL Final   Comment:         Tanner stages   (7-17 Years)                 Female            Female Tanner I        <or=89 ug/dL    <or=46 ug/dL Tanner II       <or=81 ug/dL    15-113 ug/dL Tanner III      22-126 ug/dL    42-162 ug/dL Tanner IV       33-177 ug/dL    42-241 ug/dL Tanner V       110-510 ug/dL    45-320 ug/dL   DHEA-S values fall with advancing age. For reference, the reference intervals for 18-13 year old patients are: Female 23-266 ug/dL and Female 106-464 ug/dL.   Marland Kitchen 17-OH-Progesterone, LC/MS/MS 04/01/2016 137   ng/dL Final   Comment: Unable to flag abnormal result(s), please refer     to reference range(s) below: Adult Female Reference Ranges   for 17-Hydroxyprogesterone, LC/MS/MS:     Follicular Phase:   123XX123 ng/dL or less         Luteal Phase:   285 ng/dL or less       Postmenopausal:    45 ng/dL or less            Pregnancy:      First Trimester:   78 - 457 ng/dL     Second Trimester:   90 - 357 ng/dL      Third Trimester:  144 - 578 ng/dL This test was developed and its analytical performance characteristics have been determined by [place], [place]. It has not been cleared or approved by the U.S. Food and Drug Administration. This assay has been validated pursuant to the CLIA regulations and is used for clinical purposes.   . Testosterone 03/31/2016 41  8 - 48 ng/dL Final  . Testosterone, Free 03/31/2016 5.2* 0.0 - 4.2 pg/mL Final  . Sex Hormone Binding 03/31/2016 56.9  24.6 -  122.0 nmol/L Final  . Sodium 03/30/2016 138  135 - 146 mmol/L Final  . Potassium 03/30/2016 4.0  3.5 - 5.3 mmol/L Final  . Chloride 03/30/2016 104  98 - 110 mmol/L Final  . CO2 03/30/2016 22  20 - 31 mmol/L Final  . Glucose, Bld 03/30/2016 76  65 - 99 mg/dL Final  . BUN 03/30/2016 11  7 - 25 mg/dL Final  . Creat 03/30/2016 0.77  0.50 - 1.10 mg/dL Final  . Total Bilirubin 03/30/2016 0.7  0.2 - 1.2 mg/dL Final  . Alkaline Phosphatase 03/30/2016 66  33 - 115 U/L Final  . AST 03/30/2016 36* 10 - 30 U/L Final  . ALT 03/30/2016 29  6 - 29 U/L Final  . Total Protein 03/30/2016 6.8  6.1 - 8.1 g/dL Final  . Albumin 03/30/2016 4.3  3.6 - 5.1 g/dL Final  . Calcium 03/30/2016 9.2  8.6 - 10.2 mg/dL Final  . GFR, Est African American 03/30/2016 >89  >=60 mL/min Final  . GFR, Est Non African American 03/30/2016 >89  >=60 mL/min Final  . Cholesterol 03/30/2016 207* 0 - 200 mg/dL  Final  . Triglycerides 03/30/2016 107.0  0.0 - 149.0 mg/dL Final  . HDL 03/30/2016 74.30  >39.00 mg/dL Final  . VLDL 03/30/2016 21.4  0.0 - 40.0 mg/dL Final  . LDL Cholesterol 03/30/2016 111* 0 - 99 mg/dL Final  . Total CHOL/HDL Ratio 03/30/2016 3   Final  . NonHDL 03/30/2016 132.26   Final  . Hgb A1c MFr Bld 03/30/2016 5.0  4.6 - 6.5 % Final   Labs point towards PCOS, with an elevated free testosterone and DHEAS. She also has a slightly elevated LDL and AST. Thyroid tests are normal, as is a hemoglobin A1c. Vitamin D slightly low and I suggested to start an over-the-counter vitamin D supplement, 2000 units daily.  Philemon Kingdom, MD PhD [facility]

## 2016-03-31 LAB — DHEA-SULFATE: DHEA-SO4: 278 ug/dL — ABNORMAL HIGH (ref 23–266)

## 2016-03-31 LAB — TESTOSTERONE, FREE, TOTAL, SHBG
SEX HORMONE BINDING: 56.9 nmol/L (ref 24.6–122.0)
Testosterone, Free: 5.2 pg/mL — ABNORMAL HIGH (ref 0.0–4.2)
Testosterone: 41 ng/dL (ref 8–48)

## 2016-04-01 LAB — 17-HYDROXYPROGESTERONE: 17-OH-PROGESTERONE, LC/MS/MS: 137 ng/dL

## 2016-04-07 ENCOUNTER — Other Ambulatory Visit (INDEPENDENT_AMBULATORY_CARE_PROVIDER_SITE_OTHER): Payer: 59

## 2016-04-07 DIAGNOSIS — E282 Polycystic ovarian syndrome: Secondary | ICD-10-CM

## 2016-04-07 LAB — POTASSIUM: Potassium: 3.8 mEq/L (ref 3.5–5.1)

## 2016-05-13 ENCOUNTER — Other Ambulatory Visit: Payer: Self-pay | Admitting: Internal Medicine

## 2016-05-13 ENCOUNTER — Telehealth: Payer: Self-pay | Admitting: Internal Medicine

## 2016-05-13 DIAGNOSIS — Z20828 Contact with and (suspected) exposure to other viral communicable diseases: Secondary | ICD-10-CM

## 2016-05-13 MED ORDER — OSELTAMIVIR PHOSPHATE 75 MG PO CAPS: 75.0000 mg | ORAL_CAPSULE | Freq: Every day | ORAL | 0 refills | Status: DC

## 2016-05-13 MED FILL — OSELTAMIVIR PHOS 75 MG CAP: 75 | 10 days supply | Qty: 10 | Fill #0

## 2016-05-13 NOTE — Telephone Encounter (Signed)
done

## 2016-05-13 NOTE — Telephone Encounter (Signed)
Please advise in Dr. Nathanial Millman absence. Thanks,.

## 2016-05-13 NOTE — Telephone Encounter (Signed)
Pt called in and all 3 of her kids tested positive for the flu this week.  She would like to know if thamiflu today.  Her Husband was also tested positive for it .   Pharmacy - [facility]

## 2016-06-04 LAB — HM PAP SMEAR

## 2016-07-08 ENCOUNTER — Encounter: Payer: Self-pay | Admitting: Internal Medicine

## 2016-07-08 ENCOUNTER — Ambulatory Visit (INDEPENDENT_AMBULATORY_CARE_PROVIDER_SITE_OTHER): Payer: 59 | Admitting: Internal Medicine

## 2016-07-08 VITALS — BP 122/76 | HR 56 | Temp 98.1°F | Ht 65.0 in | Wt 185.0 lb

## 2016-07-08 DIAGNOSIS — N3000 Acute cystitis without hematuria: Secondary | ICD-10-CM | POA: Diagnosis not present

## 2016-07-08 DIAGNOSIS — R300 Dysuria: Secondary | ICD-10-CM

## 2016-07-08 DIAGNOSIS — N390 Urinary tract infection, site not specified: Secondary | ICD-10-CM | POA: Insufficient documentation

## 2016-07-08 LAB — POC URINALSYSI DIPSTICK (AUTOMATED)
BILIRUBIN UA: NEGATIVE
GLUCOSE UA: NEGATIVE
Ketones, UA: NEGATIVE
NITRITE UA: NEGATIVE
PH UA: 6
Protein, UA: NEGATIVE
RBC UA: NEGATIVE
SPEC GRAV UA: 1.015
Urobilinogen, UA: NEGATIVE

## 2016-07-08 MED ORDER — SULFAMETHOXAZOLE-TRIMETHOPRIM 800-160 MG PO TABS: 1.0000 | ORAL_TABLET | Freq: Two times a day (BID) | ORAL | 0 refills | Status: DC

## 2016-07-08 MED FILL — SULFAMETHOXAZOLE/TMP DS TAB: 800-160 | 5 days supply | Qty: 10 | Fill #0

## 2016-07-08 NOTE — Assessment & Plan Note (Signed)
U/A in the office with signs of infection. Given allergy to macrobid rx bactrim 5 day course.

## 2016-07-08 NOTE — Progress Notes (Signed)
Pre visit review using our clinic review tool, if applicable. No additional management support is needed unless otherwise documented below in the visit note. 

## 2016-07-08 NOTE — Patient Instructions (Signed)
We have sent in the bactrim for the bladder infection. Take 1 pill twice a day for 5 days.

## 2016-07-08 NOTE — Progress Notes (Signed)
   Subjective:    Patient ID: Jamie Nelson, female    DOB: February 22, 1981, 36 y.o.   MRN: [id number]  HPI The patient is a 36 YO female coming in for UTI. She is having some burning with urination for the last 4 days. Also some urgency with going. Denies back pain or nausea or vomiting. No fevers or chills. Overall symptoms are worsening. She has tried drinking more water and this has not helped. Still eating and drinking well.   Review of Systems  Constitutional: Negative for activity change, appetite change, fatigue, fever and unexpected weight change.  Respiratory: Negative.   Cardiovascular: Negative.   Gastrointestinal: Negative.   Genitourinary: Positive for dysuria, frequency and urgency. Negative for decreased urine volume, difficulty urinating, flank pain, pelvic pain, vaginal bleeding, vaginal discharge and vaginal pain.  Musculoskeletal: Negative.       Objective:   Physical Exam  Constitutional: She is oriented to person, place, and time. She appears well-developed and well-nourished.  HENT:  Head: Normocephalic and atraumatic.  Eyes: EOM are normal.  Neck: Normal range of motion.  Cardiovascular: Normal rate and regular rhythm.   Pulmonary/Chest: Effort normal and breath sounds normal.  Abdominal: Soft. Bowel sounds are normal. She exhibits no distension. There is no tenderness. There is no rebound and no guarding.  Neurological: She is alert and oriented to person, place, and time. Coordination normal.  Skin: Skin is warm and dry.   Vitals:   07/08/16 1121  BP: 122/76  Pulse: (!) 56  Temp: 98.1 F (36.7 C)  TempSrc: Oral  SpO2: 100%  Weight: 185 lb (83.9 kg)  Height: 5\' 5"  (1.651 m)      Assessment & Plan:

## 2016-07-23 ENCOUNTER — Other Ambulatory Visit (HOSPITAL_COMMUNITY): Payer: Self-pay | Admitting: Chiropractic Medicine

## 2016-07-23 ENCOUNTER — Ambulatory Visit (HOSPITAL_COMMUNITY)
Admission: RE | Admit: 2016-07-23 | Discharge: 2016-07-23 | Disposition: A | Discharge status: Home / Self Care | Payer: 59 | Source: Ambulatory Visit | Attending: Chiropractic Medicine | Admitting: Chiropractic Medicine

## 2016-07-23 DIAGNOSIS — S299XXA Unspecified injury of thorax, initial encounter: Secondary | ICD-10-CM | POA: Diagnosis not present

## 2016-07-23 DIAGNOSIS — M545 Low back pain, unspecified: Secondary | ICD-10-CM

## 2016-07-23 DIAGNOSIS — M4186 Other forms of scoliosis, lumbar region: Secondary | ICD-10-CM | POA: Diagnosis not present

## 2016-07-23 DIAGNOSIS — S199XXA Unspecified injury of neck, initial encounter: Secondary | ICD-10-CM | POA: Diagnosis not present

## 2016-07-23 DIAGNOSIS — S3992XA Unspecified injury of lower back, initial encounter: Secondary | ICD-10-CM | POA: Diagnosis not present

## 2016-07-31 ENCOUNTER — Ambulatory Visit: Payer: 59 | Admitting: Internal Medicine

## 2016-08-06 MED FILL — metFORMIN HCL 500 MG TABS: 500 | 90 days supply | Qty: 360 | Fill #1

## 2016-08-06 MED FILL — SPIRONOLACTONE 50 MG TABLET: 50 | 90 days supply | Qty: 180 | Fill #1

## 2016-09-25 ENCOUNTER — Telehealth: Payer: 59 | Admitting: Family

## 2016-09-25 DIAGNOSIS — N390 Urinary tract infection, site not specified: Secondary | ICD-10-CM | POA: Diagnosis not present

## 2016-09-25 MED ORDER — CEPHALEXIN 500 MG PO CAPS: 500.0000 mg | ORAL_CAPSULE | Freq: Two times a day (BID) | ORAL | 0 refills | Status: DC

## 2016-09-25 MED FILL — CEPHALEXIN 500 MG CAPSULE: 500 | 7 days supply | Qty: 14 | Fill #0

## 2016-09-25 NOTE — Progress Notes (Signed)

## 2016-09-28 ENCOUNTER — Ambulatory Visit: Payer: 59 | Admitting: Internal Medicine

## 2016-10-12 ENCOUNTER — Other Ambulatory Visit: Payer: Self-pay | Admitting: Certified Nurse Midwife

## 2016-10-12 DIAGNOSIS — Z01419 Encounter for gynecological examination (general) (routine) without abnormal findings: Secondary | ICD-10-CM | POA: Diagnosis not present

## 2016-10-12 DIAGNOSIS — Z6831 Body mass index (BMI) 31.0-31.9, adult: Secondary | ICD-10-CM | POA: Diagnosis not present

## 2016-10-12 DIAGNOSIS — N6320 Unspecified lump in the left breast, unspecified quadrant: Secondary | ICD-10-CM

## 2016-10-12 DIAGNOSIS — Z1151 Encounter for screening for human papillomavirus (HPV): Secondary | ICD-10-CM | POA: Diagnosis not present

## 2016-10-15 ENCOUNTER — Ambulatory Visit
Admission: RE | Admit: 2016-10-15 | Discharge: 2016-10-15 | Disposition: A | Discharge status: Home / Self Care | Payer: 59 | Source: Ambulatory Visit | Attending: Certified Nurse Midwife | Admitting: Certified Nurse Midwife

## 2016-10-15 DIAGNOSIS — N6489 Other specified disorders of breast: Secondary | ICD-10-CM | POA: Diagnosis not present

## 2016-10-15 DIAGNOSIS — N6320 Unspecified lump in the left breast, unspecified quadrant: Secondary | ICD-10-CM

## 2016-10-15 DIAGNOSIS — R928 Other abnormal and inconclusive findings on diagnostic imaging of breast: Secondary | ICD-10-CM | POA: Diagnosis not present

## 2016-12-09 MED FILL — metFORMIN HCL 500 MG TABS: 500 | 90 days supply | Qty: 360 | Fill #2

## 2016-12-09 MED FILL — SPIRONOLACTONE 50 MG TABLET: 50 | 90 days supply | Qty: 180 | Fill #2

## 2016-12-22 ENCOUNTER — Encounter: Payer: Self-pay | Admitting: Internal Medicine

## 2016-12-22 ENCOUNTER — Ambulatory Visit (INDEPENDENT_AMBULATORY_CARE_PROVIDER_SITE_OTHER): Payer: 59 | Admitting: Internal Medicine

## 2016-12-22 VITALS — BP 128/82 | HR 73 | Wt 187.0 lb

## 2016-12-22 DIAGNOSIS — E559 Vitamin D deficiency, unspecified: Secondary | ICD-10-CM | POA: Diagnosis not present

## 2016-12-22 DIAGNOSIS — R7989 Other specified abnormal findings of blood chemistry: Secondary | ICD-10-CM | POA: Insufficient documentation

## 2016-12-22 DIAGNOSIS — R635 Abnormal weight gain: Secondary | ICD-10-CM | POA: Diagnosis not present

## 2016-12-22 DIAGNOSIS — E278 Other specified disorders of adrenal gland: Secondary | ICD-10-CM | POA: Insufficient documentation

## 2016-12-22 DIAGNOSIS — E282 Polycystic ovarian syndrome: Secondary | ICD-10-CM | POA: Diagnosis not present

## 2016-12-22 LAB — HEMOGLOBIN A1C: Hgb A1c MFr Bld: 5 % (ref 4.6–6.5)

## 2016-12-22 LAB — VITAMIN D 25 HYDROXY (VIT D DEFICIENCY, FRACTURES): VITD: 39.2 ng/mL (ref 30.00–100.00)

## 2016-12-22 MED ORDER — METFORMIN HCL ER 500 MG PO TB24: 1000.0000 mg | ORAL_TABLET | Freq: Two times a day (BID) | ORAL | 3 refills | Status: DC

## 2016-12-22 NOTE — Patient Instructions (Addendum)
Please stop at the lab.  Please change to Metformin ER 1000 mg 2x a day with meals.  Continue Spironolactone 50 mg 2x a day.  Please return in 1 year.

## 2016-12-22 NOTE — Progress Notes (Signed)
Patient ID: Jamie Nelson, female   DOB: Oct 14, 1980, 36 y.o.   MRN: [id number]   HPI: Jamie Nelson is a 36 y.o. female, returning for follow-up for PCOS, increased DHEAS, vitamin D deficiency. Last visit 8 months ago.  Reviewed and addended history:  Fertility/Menstrual cycles: - She had regular menses all her life - no h/o ovarian cysts - children: 3, one with Down syndrome - miscarriages: 0 - contraception: She had tubal ligation in 2013, was briefly on OCPs in 2010  Acne: - face - on Spironolactone 50 >> in 03/2016, we increased to 50 mg twice a day - on Proactive before >> now "Unblemish"  Hirsutism: - only chin + neck and only few hair shafts above upper lip - plucks it  Weight gain: - Since last visit, she lost a net weight of 8 pounds. - no steroid use - no weight loss meds - Meals: - Breakfast: Breakfast bar protein shake, banana - Lunch: [place] or week sandwich or grilled chicken salad - Dinner: Port or chicken, brown rice, veggies - Snacks: Cheese stick once or twice per day or fruit Drinks: No sodas no sweet drinks - Diets tried: Tried the "ideal protein diet" >> no success - Exercise: Running, she ran half- marathons in 2017; still running 2-3 times a week, also boot camp. Training for a half-marathon in 03/2017  Treatments tried: - we started Metformin >>  03/2016 tolerates this well and lost weight - She is on Spironolactone 50 mg 2x a day since 03/2016 >> acne improved - did not try [place] off OCPs.  Reviewed pertinent labs from 03/30/2016: Component     Latest Ref Rng & Units 03/30/2016  Testosterone     8 - 48 ng/dL 41  Testosterone Free     0.0 - 4.2 pg/mL 5.2 (H)  Sex Horm Binding Glob, Serum     24.6 - 122.0 nmol/L 56.9  TSH     0.35 - 4.50 uIU/mL 0.84  T4,Free(Direct)     0.60 - 1.60 ng/dL 0.80  Triiodothyronine,Free,Serum     2.3 - 4.2 pg/mL 3.8  DHEA-SO4     23 - 266 ug/dL 278 (H)  17-OH-Progesterone, LC/MS/MS      ng/dL  137  Her DHEAS was only slightly elevated. Free testosterone was higher.  She also had mildly elevated AST, high normal ALT then: Component     Latest Ref Rng & Units 03/30/2016  AST     10 - 30 U/L 36 (H)  ALT     6 - 29 U/L 29   Reviewed pertinent labs  from 10/14/2015: - Prolactin 7.64 - HbA1c 5.3% - A.m. cortisol 10.2 - LH 5, FSH 2 - Free testosterone 2.1 (0-4.2) - DHEAS 298.7 (57.3-279.2)  - Last thyroid tests: Lab Results  Component Value Date   TSH 0.84 03/30/2016   FREET4 0.80 03/30/2016   - + HL. Last set of lipids:    Component Value Date/Time   CHOL 207 (H) 03/30/2016 1211   TRIG 107.0 03/30/2016 1211   HDL 74.30 03/30/2016 1211   CHOLHDL 3 03/30/2016 1211   VLDL 21.4 03/30/2016 1211   LDLCALC 111 (H) 03/30/2016 1211   She has no FH of DM.   She also has vit D insufficiency. Last vit D was low: Lab Results  Component Value Date   VD25OH 24.38 (L) 03/30/2016  We started OTC vit D 2000 IU daily >> off it for x1 mo. [place]  be[place].  ROS: Constitutional: + weight loss, no fatigue, no subjective hyperthermia, no subjective hypothermia Eyes: no blurry vision, no xerophthalmia ENT: no sore throat, no nodules palpated in throat, no dysphagia, no odynophagia, no hoarseness Cardiovascular: no CP/no SOB/+ occas. palpitations/no leg swelling Respiratory: no cough/no SOB/no wheezing Gastrointestinal: no N/no V/no D/no C/no acid reflux Musculoskeletal: no muscle aches/no joint aches Skin: no rashes, no hair loss Neurological: no tremors/no numbness/no tingling/no dizziness, + HA  I reviewed pt's medications, allergies, PMH, social hx, family hx, and changes were documented in the history of present illness. Otherwise, unchanged from my initial visit note.  Past Medical History:  Diagnosis Date  . ALLERGIC RHINITIS   . ANXIETY    hx - no meds  . ARTHROSCOPY, KNEE, HX OF   . DEPRESSION    hx - no meds  . HEPATIC CYST   . Postpartum care following  cesarean delivery 02/26/2012  . S/P cesarean section (10/4) 02/26/2012  . Seasonal allergies   . WISDOM TEETH EXTRACTION, HX OF    Past Surgical History:  Procedure Laterality Date  . Benign liver cyst removed  1988  . CESAREAN SECTION  09/2008  . CESAREAN SECTION  02/26/2012   Procedure: CESAREAN SECTION;  Surgeon: Claiborne Billings A. Pamala Hurry, MD;  Location: [facility];  Service: Obstetrics;  Laterality: N/A;  EDD: 02/27/12  . CESAREAN SECTION WITH BILATERAL TUBAL LIGATION Bilateral 01/30/2014   Procedure: Repeat CESAREAN SECTION WITH TUBAL LIGATION;  Surgeon: Claiborne Billings A. Pamala Hurry, MD;  Location: [facility];  Service: Obstetrics;  Laterality: Bilateral;  EDD: 02/03/14  . KNEE ARTHROSCOPY     left   . SCAR REVISION N/A 01/30/2014   Procedure: SCAR REVISION;  Surgeon: Claiborne Billings A. Pamala Hurry, MD;  Location: [facility];  Service: Obstetrics;  Laterality: N/A;  . wisdom teeth extraction  2004   Social History   Social History  . Marital status: Married    Spouse name: N/A  . Number of children: 3   Occupational History  . RN   Social History Main Topics  . Smoking status: Never Smoker  . Smokeless tobacco: Never Used     Comment: Married, lives with spouse Herbie [place] who was Chief Executive Officer of hospitalist service)   . Alcohol use Yes     Comment: socially but none with pregnancy  . Drug use: No   Current Outpatient Prescriptions on File Prior to Visit  Medication Sig Dispense Refill  . metFORMIN (GLUCOPHAGE) 500 MG tablet Take 2 tablets (1,000 mg total) by mouth 2 (two) times daily with a meal. 360 tablet 3  . spironolactone (ALDACTONE) 50 MG tablet Take 1 tablet (50 mg total) by mouth 2 (two) times daily. 180 tablet 3   No current facility-administered medications on file prior to visit.    Allergies  Allergen Reactions  . Nitrofurantoin     REACTION: Hives   Family History  Problem Relation Age of Onset  . Cancer Maternal Grandmother        Lung  . Breast cancer Other   . Colon cancer Other         parent, grandparent  . Diabetes Other        grandparent  . Hypertension Other        parent  . Lung cancer Other   . Cancer Maternal Aunt        Breast  . Breast cancer Maternal Aunt   . Stroke Paternal Grandmother   . Stroke Paternal Grandfather    PE: BP  128/82 (BP Location: Left Arm, Patient Position: Sitting)   Pulse 73   Wt 187 lb (84.8 kg)   LMP 12/09/2016   SpO2 97%   BMI 31.12 kg/m  Wt Readings from Last 3 Encounters:  12/22/16 187 lb (84.8 kg)  07/08/16 185 lb (83.9 kg)  03/30/16 195 lb (88.5 kg)   Constitutional: overweight, in NAD Eyes: PERRLA, EOMI, no exophthalmos ENT: moist mucous membranes, no thyromegaly, no cervical lymphadenopathy Cardiovascular: RRR, No MRG Respiratory: CTA B Gastrointestinal: abdomen soft, NT, ND, BS+ Musculoskeletal: no deformities, strength intact in all 4 Skin: moist, warm, no rashes, + mild acne on face, + dark terminal hair on chin, + vellum on sideburns,no skin tags, no acanthosis nigricans, no purple, wide, stretch marks Neurological: no tremor with outstretched hands, DTR normal in all 4  ASSESSMENT: 1. PCOS  2. Elevated DHEAS  3. Weight gain  4. Vitamin D def insufficiency  PLAN: 1.  - Patient with history of mild PCOS based on elevated testosterone level in the past, right before starting spironolactone. She also had difficulty losing weight despite being a runner, she had a high LH-FSH ratio, elevated DHEAS, vellum on sideburns, acne and increased hair on face. However, her menstrual cycles ROS regular and she had no problems getting pregnant. Also, she does not history of ovarian cysts.  - At last visit, I suggested to start metformin, and she is now taking 1000 mg twice a day. She does have occasional diarrhea with this and I suggested to switch to an extended release formulation. She had very good results from metformin, losing approximately 10 pounds from last visit (she gained 2 pounds at the beach last week) - At  last visit, we also increased her spironolactone to improve her acne and her facial hair. She feels that her acne is slightly better and would like to continue with 50 mg twice a day spironolactone. We will check a potassium level today. A level checked 1 week after increasing the dose was normal. He does not complain of dizziness and her blood pressure today is not low. He does have occasional palpitations when she lays down in bed at night.  - her menstrual cycles remain regular. She has a history of tubal ligation and would like to avoid OCPs. - We will check the following labs today: Orders Placed This Encounter  Procedures  . Vitamin D, 25-hydroxy  . COMPLETE METABOLIC PANEL WITH GFR  . Hemoglobin A1c  . Testosterone, Free, Total, SHBG  . DHEA-sulfate  - We did discuss that in the setting of spironolactone treatment, the testosterone levels are not completely reliable. - I will have her come back in 1 year for recheck   2. Elevated DHEAS   - not unusual in the setting of PCOS - Slightly improved at last check- Value only slightly higher than upper limit of normal  - We will recheck today  3. Weight gain - Improved after starting metformin and also by improving her diet   4. Vitamin D  insufficiency  - Last vitamin D was low at last visit >> 24 - we started 2000 IU daily OTC vitamin D >> she was on this but stopped 1 mo ago as she spent more time outside - will recheck level today   Component     Latest Ref Rng & Units 12/22/2016  Sodium     135 - 146 mmol/L 135  Potassium     3.5 - 5.3 mmol/L 4.3  Chloride  98 - 110 mmol/L 101  CO2     20 - 31 mmol/L 22  Glucose     65 - 99 mg/dL 80  BUN     7 - 25 mg/dL 16  Creatinine     0.50 - 1.10 mg/dL 0.87  Total Bilirubin     0.2 - 1.2 mg/dL 0.9  Alkaline Phosphatase     33 - 115 U/L 86  AST     10 - 30 U/L 34 (H)  ALT     6 - 29 U/L 29  Total Protein     6.1 - 8.1 g/dL 7.2  Albumin     3.6 - 5.1 g/dL 4.5  Calcium      8.6 - 10.2 mg/dL 9.8  GFR, Est African American     >=60 mL/min >89  GFR, Est Non African American     >=60 mL/min 85  Testosterone     8 - 48 ng/dL 47  Testosterone Free     0.0 - 4.2 pg/mL 3.4  Sex Horm Binding Glob, Serum     24.6 - 122.0 nmol/L 47.9  VITD     30.00 - 100.00 ng/mL 39.20  DHEA-SO4     23 - 266 ug/dL 482 (H)  Hemoglobin A1C     4.6 - 6.5 % 5.0   AST slightly lower than before. The rest of the labs are good, except higher DHEA-sulfate. I am not sure why this increased compared to last visit. I will recheck it in 3 months.  Philemon Kingdom, MD PhD [facility]

## 2016-12-23 LAB — COMPLETE METABOLIC PANEL WITH GFR
ALBUMIN: 4.5 g/dL (ref 3.6–5.1)
ALK PHOS: 86 U/L (ref 33–115)
ALT: 29 U/L (ref 6–29)
AST: 34 U/L — AB (ref 10–30)
BILIRUBIN TOTAL: 0.9 mg/dL (ref 0.2–1.2)
BUN: 16 mg/dL (ref 7–25)
CALCIUM: 9.8 mg/dL (ref 8.6–10.2)
CO2: 22 mmol/L (ref 20–31)
Chloride: 101 mmol/L (ref 98–110)
Creat: 0.87 mg/dL (ref 0.50–1.10)
GFR, EST NON AFRICAN AMERICAN: 85 mL/min (ref >=60)
GFR, Est African American: >89 mL/min (ref >=60)
Glucose, Bld: 80 mg/dL (ref 65–99)
POTASSIUM: 4.3 mmol/L (ref 3.5–5.3)
Sodium: 135 mmol/L (ref 135–146)
Total Protein: 7.2 g/dL (ref 6.1–8.1)

## 2016-12-23 LAB — TESTOSTERONE, FREE, TOTAL, SHBG
SEX HORMONE BINDING: 47.9 nmol/L (ref 24.6–122.0)
Testosterone, Free: 3.4 pg/mL (ref 0.0–4.2)
Testosterone: 47 ng/dL (ref 8–48)

## 2016-12-24 LAB — DHEA-SULFATE: DHEA SO4: 482 ug/dL — AB (ref 23–266)

## 2017-01-08 ENCOUNTER — Encounter: Payer: Self-pay | Admitting: Internal Medicine

## 2017-01-28 DIAGNOSIS — H52223 Regular astigmatism, bilateral: Secondary | ICD-10-CM | POA: Diagnosis not present

## 2017-01-28 DIAGNOSIS — H5213 Myopia, bilateral: Secondary | ICD-10-CM | POA: Diagnosis not present

## 2017-02-15 ENCOUNTER — Telehealth: Payer: 59 | Admitting: Family

## 2017-02-15 DIAGNOSIS — N390 Urinary tract infection, site not specified: Secondary | ICD-10-CM

## 2017-02-15 MED ORDER — CEPHALEXIN 500 MG PO CAPS: 500.0000 mg | ORAL_CAPSULE | Freq: Two times a day (BID) | ORAL | 0 refills | Status: DC

## 2017-02-15 MED FILL — CEPHALEXIN 500 MG CAPSULE: 500 | 7 days supply | Qty: 14 | Fill #0

## 2017-02-15 NOTE — Progress Notes (Signed)
Thank you for the details you put in the comment boxes. Those details really help us take better care of you.   We are sorry that you are not feeling well.  Here is how we plan to help!  Based on what you shared with me it looks like you most likely have a simple urinary tract infection.  A UTI (Urinary Tract Infection) is a bacterial infection of the bladder.  Most cases of urinary tract infections are simple to treat but a key part of your care is to encourage you to drink plenty of fluids and watch your symptoms carefully.  I have prescribed Keflex 500 mg twice a day for 7 days.  Your symptoms should gradually improve. Call us if the burning in your urine worsens, you develop worsening fever, back pain or pelvic pain or if your symptoms do not resolve after completing the antibiotic.  Urinary tract infections can be prevented by drinking plenty of water to keep your body hydrated.  Also be sure when you wipe, wipe from front to back and don't hold it in!  If possible, empty your bladder every 4 hours.  Your e-visit answers were reviewed by a board certified advanced clinical practitioner to complete your personal care plan.  Depending on the condition, your plan could have included both over the counter or prescription medications.  If there is a problem please reply  once you have received a response from your provider.  Your safety is important to us.  If you have drug allergies check your prescription carefully.    You can use MyChart to ask questions about today's visit, request a non-urgent call back, or ask for a work or school excuse for 24 hours related to this e-Visit. If it has been greater than 24 hours you will need to follow up with your provider, or enter a new e-Visit to address those concerns.   You will get an e-mail in the next two days asking about your experience.  I hope that your e-visit has been valuable and will speed your recovery. Thank you for using  e-visits.    

## 2017-03-23 ENCOUNTER — Other Ambulatory Visit: Payer: 59

## 2017-03-30 ENCOUNTER — Other Ambulatory Visit: Payer: 59

## 2017-04-01 ENCOUNTER — Other Ambulatory Visit: Payer: 59

## 2017-04-01 DIAGNOSIS — R7989 Other specified abnormal findings of blood chemistry: Secondary | ICD-10-CM

## 2017-04-01 DIAGNOSIS — E278 Other specified disorders of adrenal gland: Secondary | ICD-10-CM

## 2017-04-08 ENCOUNTER — Telehealth: Payer: 59 | Admitting: Family

## 2017-04-08 DIAGNOSIS — N390 Urinary tract infection, site not specified: Secondary | ICD-10-CM

## 2017-04-08 MED ORDER — CEPHALEXIN 500 MG PO CAPS: 500.0000 mg | ORAL_CAPSULE | Freq: Two times a day (BID) | ORAL | 0 refills | Status: DC

## 2017-04-08 MED FILL — CEPHALEXIN 500 MG CAPSULE: 500 | 7 days supply | Qty: 14 | Fill #0

## 2017-04-08 NOTE — Progress Notes (Signed)

## 2017-04-09 LAB — DHEA-SULFATE, SERUM: DHEA: 302 ug/dL — AB

## 2017-04-27 ENCOUNTER — Other Ambulatory Visit: Payer: Self-pay | Admitting: Internal Medicine

## 2017-04-27 MED FILL — SPIRONOLACTONE 50 MG TAB: 50 | 90 days supply | Qty: 180 | Fill #0

## 2017-04-27 MED FILL — metFORMIN HCL 500 MG TABS: 500 | 90 days supply | Qty: 360 | Fill #0

## 2017-06-04 ENCOUNTER — Encounter: Payer: Self-pay | Admitting: Family Medicine

## 2017-06-04 ENCOUNTER — Other Ambulatory Visit: Payer: Self-pay

## 2017-06-04 ENCOUNTER — Ambulatory Visit (INDEPENDENT_AMBULATORY_CARE_PROVIDER_SITE_OTHER): Payer: 59 | Admitting: Family Medicine

## 2017-06-04 VITALS — BP 122/78 | HR 67 | Temp 97.9°F | Resp 16 | Ht 65.0 in | Wt 190.2 lb

## 2017-06-04 DIAGNOSIS — Z0000 Encounter for general adult medical examination without abnormal findings: Secondary | ICD-10-CM

## 2017-06-04 DIAGNOSIS — E669 Obesity, unspecified: Secondary | ICD-10-CM | POA: Diagnosis not present

## 2017-06-04 DIAGNOSIS — Z800 Family history of malignant neoplasm of digestive organs: Secondary | ICD-10-CM | POA: Diagnosis not present

## 2017-06-04 DIAGNOSIS — E559 Vitamin D deficiency, unspecified: Secondary | ICD-10-CM

## 2017-06-04 LAB — HEPATIC FUNCTION PANEL
ALT: 22 U/L (ref 0–35)
AST: 23 U/L (ref 0–37)
Albumin: 4.6 g/dL (ref 3.5–5.2)
Alkaline Phosphatase: 76 U/L (ref 39–117)
BILIRUBIN DIRECT: 0.1 mg/dL (ref 0.0–0.3)
BILIRUBIN TOTAL: 0.8 mg/dL (ref 0.2–1.2)
TOTAL PROTEIN: 7.3 g/dL (ref 6.0–8.3)

## 2017-06-04 LAB — CBC WITH DIFFERENTIAL/PLATELET
BASOS ABS: 0 10*3/uL (ref 0.0–0.1)
BASOS PCT: 0.2 % (ref 0.0–3.0)
EOS ABS: 0.1 10*3/uL (ref 0.0–0.7)
Eosinophils Relative: 0.8 % (ref 0.0–5.0)
HCT: 41.5 % (ref 36.0–46.0)
Hemoglobin: 13.6 g/dL (ref 12.0–15.0)
Lymphocytes Relative: 35.1 % (ref 12.0–46.0)
Lymphs Abs: 2.5 10*3/uL (ref 0.7–4.0)
MCHC: 32.6 g/dL (ref 30.0–36.0)
MCV: 93.2 fl (ref 78.0–100.0)
MONO ABS: 0.7 10*3/uL (ref 0.1–1.0)
Monocytes Relative: 9.7 % (ref 3.0–12.0)
NEUTROS PCT: 54.2 % (ref 43.0–77.0)
Neutro Abs: 3.9 10*3/uL (ref 1.4–7.7)
Platelets: 253 10*3/uL (ref 150.0–400.0)
RBC: 4.46 Mil/uL (ref 3.87–5.11)
RDW: 13 % (ref 11.5–15.5)
WBC: 7.2 10*3/uL (ref 4.0–10.5)

## 2017-06-04 LAB — BASIC METABOLIC PANEL
BUN: 13 mg/dL (ref 6–23)
CHLORIDE: 101 meq/L (ref 96–112)
CO2: 27 meq/L (ref 19–32)
CREATININE: 0.84 mg/dL (ref 0.40–1.20)
Calcium: 9.6 mg/dL (ref 8.4–10.5)
GFR: 80.72 mL/min (ref >60.00)
GLUCOSE: 84 mg/dL (ref 70–99)
Potassium: 4.2 mEq/L (ref 3.5–5.1)
SODIUM: 135 meq/L (ref 135–145)

## 2017-06-04 LAB — LIPID PANEL
CHOLESTEROL: 213 mg/dL — AB (ref 0–200)
HDL: 67.9 mg/dL (ref >39.00)
LDL Cholesterol: 124 mg/dL — ABNORMAL HIGH (ref 0–99)
NONHDL: 145.05
Total CHOL/HDL Ratio: 3
Triglycerides: 107 mg/dL (ref 0.0–149.0)
VLDL: 21.4 mg/dL (ref 0.0–40.0)

## 2017-06-04 LAB — VITAMIN D 25 HYDROXY (VIT D DEFICIENCY, FRACTURES): VITD: 28.83 ng/mL — AB (ref 30.00–100.00)

## 2017-06-04 LAB — TSH: TSH: 0.86 u[IU]/mL (ref 0.35–4.50)

## 2017-06-04 NOTE — Patient Instructions (Signed)
Follow up in 1 year or as needed We'll notify you of your lab results and make any changes if needed Continue to work on healthy diet and regular exercise- you can do it! We'll call you with your genetics referral Call with any questions or concerns Welcome!  We're glad to have you! Happy New Year!!!

## 2017-06-04 NOTE — Assessment & Plan Note (Signed)
Check labs and replete prn. 

## 2017-06-04 NOTE — Progress Notes (Signed)
   Subjective:    Patient ID: Jamie Nelson, female    DOB: 1980-11-21, 37 y.o.   MRN: [id number]  HPI UTD on pap, immunizations.  Hx of colon cancer- dad died young from colon cancer.  Asking for genetics referral.  Exercising regularly- goes to [employer], ran 1/2 [place] in November.  No other health concerns   Review of Systems Patient reports no vision/ hearing changes, adenopathy,fever, weight change,  persistant/recurrent hoarseness , swallowing issues, chest pain, palpitations, edema, persistant/recurrent cough, hemoptysis, dyspnea (rest/exertional/paroxysmal nocturnal), gastrointestinal bleeding (melena, rectal bleeding), abdominal pain, significant heartburn, bowel changes, GU symptoms (dysuria, hematuria, incontinence), Gyn symptoms (abnormal  bleeding, pain),  syncope, focal weakness, memory loss, numbness & tingling, skin/hair/nail changes, abnormal bruising or bleeding, anxiety, or depression.     Objective:   Physical Exam General Appearance:    Alert, cooperative, no distress, appears stated age  Head:    Normocephalic, without obvious abnormality, atraumatic  Eyes:    PERRL, conjunctiva/corneas clear, EOM's intact, fundi    benign, both eyes  Ears:    Normal TM's and external ear canals, both ears  Nose:   Nares normal, septum midline, mucosa normal, no drainage    or sinus tenderness  Throat:   Lips, mucosa, and tongue normal; teeth and gums normal  Neck:   Supple, symmetrical, trachea midline, no adenopathy;    Thyroid: no enlargement/tenderness/nodules  Back:     Symmetric, no curvature, ROM normal, no CVA tenderness  Lungs:     Clear to auscultation bilaterally, respirations unlabored  Chest Wall:    No tenderness or deformity   Heart:    Regular rate and rhythm, S1 and S2 normal, no murmur, rub   or gallop  Breast Exam:    Deferred to GYN  Abdomen:     Soft, non-tender, bowel sounds active all four quadrants,    no masses, no organomegaly  Genitalia:     Deferred to GYN  Rectal:    Extremities:   Extremities normal, atraumatic, no cyanosis or edema  Pulses:   2+ and symmetric all extremities  Skin:   Skin color, texture, turgor normal, no rashes or lesions  Lymph nodes:   Cervical, supraclavicular, and axillary nodes normal  Neurologic:   CNII-XII intact, normal strength, sensation and reflexes    throughout          Assessment & Plan:

## 2017-06-04 NOTE — Assessment & Plan Note (Signed)
Pt's PE WNL w/ exception of obesity.  UTD on GYN and immunizations.  Check labs.  Anticipatory guidance provided.  

## 2017-06-04 NOTE — Assessment & Plan Note (Signed)
Ongoing issue for pt.  She is exercising regularly but has difficulty losing weight due to PCOS.  Will continue to follow.

## 2017-06-04 NOTE — Assessment & Plan Note (Signed)
Father was dx'd at age 38.  Strong family hx.  Will refer for genetic testing.  Plan will be for early screening at age 39

## 2017-06-07 ENCOUNTER — Other Ambulatory Visit: Payer: Self-pay | Admitting: General Practice

## 2017-06-07 MED ORDER — VITAMIN D (ERGOCALCIFEROL) 1.25 MG (50000 UNIT) PO CAPS: 50000.0000 [IU] | ORAL_CAPSULE | ORAL | 0 refills | Status: DC

## 2017-06-07 MED FILL — VIT D2 1.25 MG (50,000 UNIT: 1.25 MG | 84 days supply | Qty: 12 | Fill #0

## 2017-07-20 ENCOUNTER — Encounter: Payer: 59 | Admitting: Genetics

## 2017-07-20 ENCOUNTER — Other Ambulatory Visit: Payer: 59

## 2017-07-27 ENCOUNTER — Encounter: Payer: Self-pay | Admitting: Genetics

## 2017-07-27 ENCOUNTER — Inpatient Hospital Stay: Payer: 59 | Attending: Genetic Counselor | Admitting: Genetics

## 2017-07-27 ENCOUNTER — Inpatient Hospital Stay: Payer: 59

## 2017-07-27 DIAGNOSIS — Z803 Family history of malignant neoplasm of breast: Secondary | ICD-10-CM | POA: Diagnosis not present

## 2017-07-27 DIAGNOSIS — Z83719 Family history of colon polyps, unspecified: Secondary | ICD-10-CM

## 2017-07-27 DIAGNOSIS — Z800 Family history of malignant neoplasm of digestive organs: Secondary | ICD-10-CM | POA: Insufficient documentation

## 2017-07-27 DIAGNOSIS — Z801 Family history of malignant neoplasm of trachea, bronchus and lung: Secondary | ICD-10-CM | POA: Insufficient documentation

## 2017-07-27 DIAGNOSIS — Z8371 Family history of colonic polyps: Secondary | ICD-10-CM | POA: Insufficient documentation

## 2017-07-27 HISTORY — DX: Family history of colonic polyps: Z83.71

## 2017-07-27 HISTORY — DX: Family history of colon polyps, unspecified: Z83.719

## 2017-07-27 NOTE — Progress Notes (Signed)
REFERRING PROVIDER: Midge Minium, MD [address] [place], [place] [address]  PRIMARY PROVIDER:  Midge Minium, MD  PRIMARY REASON FOR VISIT:  1. Family history of colon cancer   2. Family history of pancreatic cancer   3. Family history of breast cancer   4. Family history of lung cancer     HISTORY OF PRESENT ILLNESS:   Jamie Nelson, a 37 y.o. female, was seen for a [facility] cancer genetics consultation at the request of Dr. Birdie Riddle due to a family history of colon cancer.  Jamie Nelson presents to clinic today to discuss the possibility of a hereditary predisposition to cancer, genetic testing, and to further clarify her future cancer risks, as well as potential cancer risks for family members.   Jamie Nelson is a 37 y.o. female with no personal history of cancer.    HORMONAL RISK FACTORS:  Menarche was at age 65.  First live birth at age 73.  OCP use for approximately 0 and 10 years.  Ovaries intact: yes.  Hysterectomy: no.  Menopausal status: premenopausal.  HRT use: 0 years. Colonoscopy: no; not examined. Mammogram within the last year: yes. Had 1 due to lump, no biopsy needed.  Told to start again at age 57.  Number of breast biopsies: 0.   Past Medical History:  Diagnosis Date  . ALLERGIC RHINITIS   . ANXIETY    hx - no meds  . ARTHROSCOPY, KNEE, HX OF   . DEPRESSION    hx - no meds  . Family history of breast cancer   . Family history of colon cancer   . Family history of lung cancer   . Family history of pancreatic cancer   . HEPATIC CYST   . PCOS (polycystic ovarian syndrome)   . Postpartum care following cesarean delivery 02/26/2012  . S/P cesarean section (10/4) 02/26/2012  . Seasonal allergies   . WISDOM TEETH EXTRACTION, HX OF     Past Surgical History:  Procedure Laterality Date  . Benign liver cyst removed  1988  . CESAREAN SECTION  09/2008  . CESAREAN SECTION  02/26/2012   Procedure: CESAREAN SECTION;  Surgeon: Claiborne Billings A.  Pamala Hurry, MD;  Location: [facility];  Service: Obstetrics;  Laterality: N/A;  EDD: 02/27/12  . CESAREAN SECTION WITH BILATERAL TUBAL LIGATION Bilateral 01/30/2014   Procedure: Repeat CESAREAN SECTION WITH TUBAL LIGATION;  Surgeon: Claiborne Billings A. Pamala Hurry, MD;  Location: [facility];  Service: Obstetrics;  Laterality: Bilateral;  EDD: 02/03/14  . KNEE ARTHROSCOPY     left   . SCAR REVISION N/A 01/30/2014   Procedure: SCAR REVISION;  Surgeon: Claiborne Billings A. Pamala Hurry, MD;  Location: [facility];  Service: Obstetrics;  Laterality: N/A;  . wisdom teeth extraction  2004    Social History   Socioeconomic History  . Marital status: Married    Spouse name: None  . Number of children: None  . Years of education: None  . Highest education level: None  Social Needs  . Financial resource strain: None  . Food insecurity - worry: None  . Food insecurity - inability: None  . Transportation needs - medical: None  . Transportation needs - non-medical: None  Occupational History  . None  Tobacco Use  . Smoking status: Never Smoker  . Smokeless tobacco: Never Used  . Tobacco comment: Married, lives with spouse Jamie Nelson who is Chief Executive Officer of hospitalist [employer]) and son Jamie Nelson who has down symdrome  Substance and Sexual Activity  .  Alcohol use: Yes    Comment: socially but none with pregnancy  . Drug use: No  . Sexual activity: Yes    Birth control/protection: None  Other Topics Concern  . None  Social History Narrative  . None     FAMILY HISTORY:  We obtained a detailed, 4-generation family history.  Significant diagnoses are listed below: Family History  Problem Relation Age of Onset  . Colon cancer Father 61       dx at stage IV  . Lung cancer Maternal Grandmother   . Breast cancer Maternal Aunt 60       took HRT  . Stroke Paternal Grandmother   . Diabetes Paternal Grandmother   . Stroke Paternal Grandfather   . Hypertension Mother   . Depression Mother   . Colon polyps Mother        goes every 2 years  for c-scope  . Colon cancer Maternal Grandfather 29  . Colon cancer Maternal Uncle 38  . Colon cancer Paternal Uncle        dx late 27's  . Colon cancer Paternal Uncle        dx late 39's  . Colon polyps Paternal Uncle        precancerous  . Pancreatic cancer Other 6   Jamie Nelson has an 9 year-old son with Down's Syndrome.  She also has a 22 year-old daughter and a 9 year-old daughter.  Jamie Nelson has a 58 year-old sister with no history of cancer.  This sister has 3 children.  Jamie Nelson has another sister who is 90 with no history of cancer.  She had genetic testing that showed no 'genetic risk factors'.  This sister has 4 children.   Jamie Nelson's father was diagnosed with colon cancer at the age of 27- it was stage IV at the time of diagnosis.  He died at the age of 99.  Jamie Nelson has 3 paternal uncles and 1 paternal aunt.   -1 paternal aunt is 36 with no history of cancer.  She has 2 sons with no history of cancer.   -1 paternal uncle is in his 75 and was diagnosed with colon cancer in his late 44's.  He had a colectomy.  He has 2 biological children with no history of cancer.  -1 paternal uncle was diagnosed with colon cancer in his late 35's and is now in his 51's.  He has 2 children with no history of cancer.  -1 paternal uncle is 25 and has a history of some precancerous polyps being removed.  He has 2 adopted children.  Jamie Nelson's paternal grandparents both died in their 25's/80's due to strokes.    Jamie Nelson mother is 80 with no history of cancer.  She goes for colonoscopies every 2 years due to polyps that have been found in her.  Jamie Nelson has 2 maternal uncles and 1 maternal aunt: -1 maternal aunt was diagnosed with breast cancer in her early 41's.  She used hormone replacement therapy.   -1 maternal uncle is currently being treated for colon cancer that was diagnosed in his 28's.  He has 2 children with no history of cancer.  -1 maternal uncle is in his  32's with no history of cancer.  Jamie Nelson's maternal grandfather died of colon cancer at 7.  This grandfather's father (patients great-grandfather) died of pancreatic cancer in his late 8's.  Jamie Nelson maternal grandmother died at 3 due to lung cancer.  Patient's maternal ancestors are of Northern European descent, and paternal ancestors are of Northern European descent. There is no reported Ashkenazi Jewish ancestry. There is no known consanguinity.  GENETIC COUNSELING ASSESSMENT: Jamie Nelson is a 37 y.o. female with a family history which is somewhat suggestive of a Hereditary Cancer Predisposition Syndrome. We, therefore, discussed and recommended the following at today's visit.   DISCUSSION: We reviewed the characteristics, features and inheritance patterns of hereditary cancer syndromes. We also discussed genetic testing, including the appropriate family members to test, the process of testing, insurance coverage and turn-around-time for results. We discussed the implications of a negative, positive and/or variant of uncertain significant result. We recommended Jamie Nelson pursue genetic testing for the Multi-Cancer gene panel. The Multi-Cancer Panel offered by [facility] includes sequencing and/or deletion duplication testing of the following 83 genes: ALK, APC, ATM, AXIN2,BAP1,  BARD1, BLM, BMPR1A, BRCA1, BRCA2, BRIP1, CASR, CDC73, CDH1, CDK4, CDKN1B, CDKN1C, CDKN2A (p14ARF), CDKN2A (p16INK4a), CEBPA, CHEK2, CTNNA1, DICER1, DIS3L2, EGFR (c.2369C>T, p.Thr790Met variant only), EPCAM (Deletion/duplication testing only), FH, FLCN, GATA2, GPC3, GREM1 (Promoter region deletion/duplication testing only), HOXB13 (c.251G>A, p.Gly84Glu), HRAS, KIT, MAX, MEN1, MET, MITF (c.952G>A, p.Glu318Lys variant only), MLH1, MSH2, MSH3, MSH6, MUTYH, NBN, NF1, NF2, NTHL1, PALB2, PDGFRA, PHOX2B, PMS2, POLD1, POLE, POT1, PRKAR1A, PTCH1, PTEN, RAD50, RAD51C, RAD51D, RB1, RECQL4, RET, RUNX1, SDHAF2, SDHA (sequence  changes only), SDHB, SDHC, SDHD, SMAD4, SMARCA4, SMARCB1, SMARCE1, STK11, SUFU, TERC, TERT, TMEM127, TP53, TSC1, TSC2, VHL, WRN and WT1.   We discussed that only 5-10% of cancers are associated with a Hereditary Cancer Predisposition Syndrome.  The most common hereditary cancer syndrome associated with colon cancer is Lynch Syndrome.  Lynch Syndrome is caused by mutations in the genes: MLH1, MSH2, MSH6, PMS2 and EPCAM.  This syndrome increases the risk for colon, uterine, ovarian and stomach cancers, as well as others.  Families with Lynch Syndrome tend to have multiple family members with these cancers, typically diagnosed under age 2, and diagnoses in multiple generations.    We discussed that there are several other genes that are associated with an increased risk for colon cancer and increased polyp burden (MUTYH, APC, POLE, CHEK2, etc.) We also dicussed that there are many genes that cause many different types of cancer risks.    We discussed that if she is found to have a mutation in one of these genes, it may impact future medical management recommendations such as increased cancer screenings and consideration of risk reducing surgeries.  A positive result could also have implications for the patient's family members.  A Negative result would mean we were unable to identify a hereditary component to her cancer, but does not rule out the possibility of a hereditary basis for her cancer.  There could be mutations that are undetectable by current technology, or in genes not yet tested or identified to increase cancer risk.    We discussed the potential to find a Variant of Uncertain Significance or VUS.  These are variants that have not yet been identified as pathogenic or benign, and it is unknown if this variant is associated with increased cancer risk or if this is a normal finding.  Most VUS's are reclassified to benign or likely benign.   It should not be used to make medical management  decisions. With time, we suspect the lab will determine the significance of any VUS's identified if any.   Based on Jamie Nelson's family history of cancer, she meets medical criteria for genetic testing. Despite that she  meets criteria, she may still have an out of pocket cost. We discussed that if her out of pocket cost for testing is over $100, the laboratory will call and confirm whether she wants to proceed with testing.  If the out of pocket cost of testing is less than $100 she will be billed by the genetic testing laboratory.   We discussed that some people do not want to undergo genetic testing due to fear of genetic discrimination.  A federal law called the Genetic Information Non-Discrimination Act (GINA) of 2008 helps protect individuals against genetic discrimination based on their genetic test results.  It impacts both health insurance and employment.  For health insurance, it protects against increased premiums, being kicked off insurance or being forced to take a test in order to be insured.  For employment it protects against hiring, firing and promoting decisions based on genetic test results.  Health status due to a cancer diagnosis is not protected under GINA.  This law does not protect life insurance, disability insurance, or other types of insurance.   Based on the patient's personal and family history, the statistical model (Tyrer Cusik)   Was used to estimate her risk of developing breast cancer. This estimates her lifetime risk of developing breast cancer to be approximately 16.4%. This estimation is performed in the situation of negative genetic test results. A positive result could impact this risk assessment. The patient's lifetime breast cancer risk is a preliminary estimate based on available information using one of several models endorsed by the [place] (ACS). The ACS recommends consideration of breast MRI screening as an adjunct to mammography for patients at  high risk (defined as 20% or greater lifetime risk). A more detailed breast cancer risk assessment can be considered, if clinically indicated.       PLAN: After considering the risks, benefits, and limitations, Ms. Pizzuto  provided informed consent to pursue genetic testing and the blood sample was sent to [facility] for analysis of the Multi-CancerPanel. Results should be available within approximately 2-3 weeks' time, at which point they will be disclosed by telephone to Ms. Mccomber, as will any additional recommendations warranted by these results. Ms. Caloca will receive a summary of her genetic counseling visit and a copy of her results once available. This information will also be available in Epic. We encouraged Ms. Capers to remain in contact with cancer genetics annually so that we can continuously update the family history and inform her of any changes in cancer genetics and testing that may be of benefit for her family. Ms. Rabon questions were answered to her satisfaction today. Our contact information was provided should additional questions or concerns arise.  Based on Ms. Scholle's family history, we recommended her maternal and paternal relatives (especially those affected with cancer),  have genetic counseling and testing. Ms. Schaffert will let us know if we can be of any assistance in coordinating genetic counseling and/or testing for this family member.   Lastly, we encouraged Ms. Kielbasa to remain in contact with cancer genetics annually so that we can continuously update the family history and inform her of any changes in cancer genetics and testing that may be of benefit for this family.   Ms.  Yellin questions were answered to her satisfaction today. Our contact information was provided should additional questions or concerns arise. Thank you for the referral and allowing [place] to share in the care of your patient.   Tana Felts, MS, [facility] Certified  Genetic Counselor [email or web address] phone: [phone]  The patient was seen for a total of 50 minutes in face-to-face genetic counseling.

## 2017-08-16 ENCOUNTER — Telehealth: Payer: Self-pay | Admitting: Genetics

## 2017-08-16 NOTE — Telephone Encounter (Signed)
Attempted to call patient regarding OOP cost for genetics. Mailbox full- could not leave message.

## 2017-08-19 ENCOUNTER — Telehealth: Payer: Self-pay | Admitting: Genetics

## 2017-08-24 NOTE — Telephone Encounter (Signed)
Revealed negative genetic testing.  Revealed that a VUS in NF1 was identified.   This normal result is reassuring and indicates that it is unlikely Jamie Nelson has a predisposition to cancer.  However, genetic testing is not perfect, and cannot definitively rule out a hereditary cause.  It will be important for her to keep in contact with genetics to learn if any additional testing may be needed in the future.     We recommended she have colonoscopies starting at age 3 and repeating every 5 years.   Recommended all other relatives also have genetic testing.

## 2017-08-26 ENCOUNTER — Encounter: Payer: Self-pay | Admitting: Genetics

## 2017-08-26 ENCOUNTER — Ambulatory Visit: Payer: Self-pay | Admitting: Genetics

## 2017-08-26 DIAGNOSIS — Z800 Family history of malignant neoplasm of digestive organs: Secondary | ICD-10-CM

## 2017-08-26 DIAGNOSIS — Z801 Family history of malignant neoplasm of trachea, bronchus and lung: Secondary | ICD-10-CM

## 2017-08-26 DIAGNOSIS — Z803 Family history of malignant neoplasm of breast: Secondary | ICD-10-CM

## 2017-08-26 DIAGNOSIS — Z1379 Encounter for other screening for genetic and chromosomal anomalies: Secondary | ICD-10-CM

## 2017-08-26 NOTE — Progress Notes (Signed)
HPI: Ms. Millett was previously seen in the [place] clinic on 07/27/2017 due to a family history of breast, colon, and pancreatic cancer and concerns regarding a hereditary predisposition to cancer. Please refer to our prior cancer genetics clinic note for more information regarding Ms. Fell's medical, social and family histories, and our assessment and recommendations, at the time. Ms. Manke recent genetic test results were disclosed to her, as well as recommendations warranted by these results. These results and recommendations are discussed in more detail below.  FAMILY HISTORY:  We obtained a detailed, 4-generation family history.  Significant diagnoses are listed below: Family History  Problem Relation Age of Onset  . Colon cancer Father 52       dx at stage IV  . Lung cancer Maternal Grandmother   . Breast cancer Maternal Aunt 60       took HRT  . Stroke Paternal Grandmother   . Diabetes Paternal Grandmother   . Stroke Paternal Grandfather   . Hypertension Mother   . Depression Mother   . Colon polyps Mother        goes every 2 years for c-scope  . Colon cancer Maternal Grandfather 28  . Colon cancer Maternal Uncle 17  . Colon cancer Paternal Uncle        dx late 62's  . Colon cancer Paternal Uncle        dx late 85's  . Colon polyps Paternal Uncle        precancerous  . Pancreatic cancer Other 36    Ms. Zwicker has an 71 year-old son with Down's Syndrome.  She also has a 85 year-old daughter and a 93 year-old daughter.  Ms. Doubleday has a 66 year-old sister with no history of cancer.  This sister has 3 children.  Ms. Lamere has another sister who is 27 with no history of cancer.  She had genetic testing that showed no 'genetic risk factors'.  This sister has 4 children.   Ms. Zinn's father was diagnosed with colon cancer at the age of 59- it was stage IV at the time of diagnosis.  He died at the age of 51.  Ms. Ogburn has 3 paternal uncles and  1 paternal aunt.   -1 paternal aunt is 26 with no history of cancer.  She has 2 sons with no history of cancer.   -1 paternal uncle is in his 88 and was diagnosed with colon cancer in his late 40's.  He had a colectomy.  He has 2 biological children with no history of cancer.  -1 paternal uncle was diagnosed with colon cancer in his late 25's and is now in his 77's.  He has 2 children with no history of cancer.  -1 paternal uncle is 69 and has a history of some precancerous polyps being removed.  He has 2 adopted children.  Ms. Blaustein's paternal grandparents both died in their 32's/80's due to strokes.    Ms. Centanni mother is 15 with no history of cancer.  She goes for colonoscopies every 2 years due to polyps that have been found in her.  Ms. Stallworth has 2 maternal uncles and 1 maternal aunt: -1 maternal aunt was diagnosed with breast cancer in her early 12's.  She used hormone replacement therapy.   -1 maternal uncle is currently being treated for colon cancer that was diagnosed in his 71's.  He has 2 children with no history of cancer.  -1 maternal uncle is in his  60's with no history of cancer.  Ms. Fidel's maternal grandfather died of colon cancer at 75.  This grandfather's father (patients great-grandfather) died of pancreatic cancer in his late 16's.  Ms. Dolinski maternal grandmother died at 21 due to lung cancer.   Patient's maternal ancestors are of [place] descent, and paternal ancestors are of [place] European descent. There is no reported Ashkenazi Jewish ancestry. There is no known consanguinity.  GENETIC TEST RESULTS: Genetic testing performed through [facility]'s Multi-Cancer Panel reported out on 08/17/2017 showed no pathogenic mutations. The Multi-Cancer Panel offered by [facility] includes sequencing and/or deletion duplication testing of the following 83 genes: ALK, APC, ATM, AXIN2,BAP1,  BARD1, BLM, BMPR1A, BRCA1, BRCA2, BRIP1, CASR, CDC73, CDH1, CDK4, CDKN1B,  CDKN1C, CDKN2A (p14ARF), CDKN2A (p16INK4a), CEBPA, CHEK2, CTNNA1, DICER1, DIS3L2, EGFR (c.2369C>T, p.Thr790Met variant only), EPCAM (Deletion/duplication testing only), FH, FLCN, GATA2, GPC3, GREM1 (Promoter region deletion/duplication testing only), HOXB13 (c.251G>A, p.Gly84Glu), HRAS, KIT, MAX, MEN1, MET, MITF (c.952G>A, p.Glu318Lys variant only), MLH1, MSH2, MSH3, MSH6, MUTYH, NBN, NF1, NF2, NTHL1, PALB2, PDGFRA, PHOX2B, PMS2, POLD1, POLE, POT1, PRKAR1A, PTCH1, PTEN, RAD50, RAD51C, RAD51D, RB1, RECQL4, RET, RUNX1, SDHAF2, SDHA (sequence changes only), SDHB, SDHC, SDHD, SMAD4, SMARCA4, SMARCB1, SMARCE1, STK11, SUFU, TERC, TERT, TMEM127, TP53, TSC1, TSC2, VHL, WRN and WT1.   A variant of uncertain significance (VUS) in a gene called NF1 was also noted. c.4156A>G (p.Ser1386Gly)  The test report will be scanned into EPIC and will be located under the Molecular Pathology section of the Results Review tab.A portion of the result report is included below for reference.     We discussed with Ms. Boulier that because current genetic testing is not perfect, it is possible there may be a gene mutation in one of these genes that current testing cannot detect, but that chance is small. We also discussed, that there could be another gene that has not yet been discovered, or that we have not yet tested, that is responsible for the cancer diagnoses in the family. It is also possible there is a hereditary cause for the cancer in the family that Ms. Christy did not inherit and therefore was not identified in her testing.  Therefore, it is important to remain in touch with cancer genetics in the future so that we can continue to offer Ms. Rech the most up to date genetic testing.   Regarding the VUS in NF1: At this time, it is unknown if this variant is associated with increased cancer risk or if this is a normal finding, but most variants such as this get reclassified to being inconsequential. It should not be  used to make medical management decisions. With time, we suspect the lab will determine the significance of this variant, if any. If we do learn more about it, we will try to contact Ms. Boyar to discuss it further. However, it is important to stay in touch with [place] periodically and keep the address and phone number up to date.  ADDITIONAL GENETIC TESTING: We discussed with Ms. Merkel that her genetic testing was fairly extensive.  If there are are genes identified to increase cancer risk that can be analyzed in the future, we would be happy to discuss and coordinate this testing at that time.     CANCER SCREENING RECOMMENDATIONS: Ms. Enzor's test result is considered negative (normal).  This means that we have not identified a hereditary cause for her family history of cancer at this time.   While reassuring, this does not definitively rule out a  hereditary predisposition to cancer. It is still possible that there could be genetic mutations that are undetectable by current technology, or genetic mutations in genes that have not been tested or identified to increase cancer risk.  It is also possible there is a hereditary cause for the cancer in Ms. Denk's family that she did not inherit and therefore was not identified in her testing.     Therefore, Ms. Crisco was advised to continue following the cancer screening guidelines provided by her primary healthcare providers. Other factors such as her personal and family history may still affect her cancer risk.  We recommended she begin colonoscopy screening starting at age 22 and repeating ever 5 years or as directed by her GI physician.   Based on the patient's personal and family history, the statistical model (Tyrer Cusik) Was used to estimate herrisk of developing breast cancer. This estimatesherlifetime risk of developing breast cancerto be approximately 16.4%. This estimationis performed in the situation of negative genetic test  results. A positive result could impact this risk assessment.The patient's lifetime breast cancer risk is a preliminary estimate based on available information using one of several models endorsed by the [place] (ACS). The ACS recommends consideration of breast MRI screening as an adjunct to mammography for patients at high risk (defined as 20% or greater lifetime risk).A more detailed breast cancer risk assessment can be considered, if clinically indicated.      RECOMMENDATIONS FOR FAMILY MEMBERS: Individuals in this family might be at some increased risk of developing cancer, over the general population risk, simply due to the family history of cancer. We recommended women in this family have a yearly mammogram beginning at age 75, or 80 years younger than the earliest onset of cancer, an annual clinical breast exam, and perform monthly breast self-exams. Women in this family should also have a gynecological exam as recommended by their primary provider. All family members should have a colonoscopy by age 64 (or earlier depending on family history).  We recommended her siblings all have colonoscopies every 5 years (or as directed by their doctors) starting by age 45.   All family members should inform their physicians about the family history of cancer so their doctors can make the most appropriate screening recommendations for them.   Based on Ms. Lucy's family history, we recommended her siblings, and all relatives on both sides of her family (especially those affected with cancer), have genetic counseling and testing. Ms. Oman will let us know if we can be of any assistance in coordinating genetic counseling and/or testing for these family members.   FOLLOW-UP: Lastly, we discussed with Ms. Orlick that cancer genetics is a rapidly advancing field and it is possible that new genetic tests will be appropriate for her and/or her family members in the future. We encouraged  her to remain in contact with cancer genetics on an annual basis so we can update her personal and family histories and let her know of advances in cancer genetics that may benefit this family.   Our contact number was provided. Ms. Guest's questions were answered to her satisfaction, and she knows she is welcome to call us at anytime with additional questions or concerns.   Ferol Luz, MS, [facility] Certified Genetic Counselor [email or web address]

## 2017-10-12 MED FILL — metFORMIN HCL 500 MG TABS: 500 | 90 days supply | Qty: 360 | Fill #1

## 2017-10-12 MED FILL — SPIRONOLACTONE 50 MG TAB: 50 | 90 days supply | Qty: 180 | Fill #1

## 2017-11-16 DIAGNOSIS — Z6832 Body mass index (BMI) 32.0-32.9, adult: Secondary | ICD-10-CM | POA: Diagnosis not present

## 2017-11-16 DIAGNOSIS — Z01419 Encounter for gynecological examination (general) (routine) without abnormal findings: Secondary | ICD-10-CM | POA: Diagnosis not present

## 2017-12-05 ENCOUNTER — Telehealth: Payer: 59 | Admitting: Family

## 2017-12-05 DIAGNOSIS — N390 Urinary tract infection, site not specified: Secondary | ICD-10-CM | POA: Diagnosis not present

## 2017-12-05 MED ORDER — CEPHALEXIN 500 MG PO CAPS: 500.0000 mg | ORAL_CAPSULE | Freq: Two times a day (BID) | ORAL | 0 refills | Status: DC

## 2017-12-05 NOTE — Progress Notes (Signed)

## 2017-12-06 MED FILL — CEPHALEXIN 500 MG CAPSULE: 500 | 7 days supply | Qty: 14 | Fill #0

## 2017-12-22 ENCOUNTER — Ambulatory Visit: Payer: 59 | Admitting: Internal Medicine

## 2017-12-30 ENCOUNTER — Encounter: Payer: Self-pay | Admitting: Registered"

## 2017-12-30 ENCOUNTER — Encounter: Payer: 59 | Attending: Family Medicine | Admitting: Registered"

## 2017-12-30 DIAGNOSIS — Z713 Dietary counseling and surveillance: Secondary | ICD-10-CM | POA: Insufficient documentation

## 2017-12-30 NOTE — Patient Instructions (Signed)
Get at least 15 g protein for breakfast

## 2017-12-30 NOTE — Progress Notes (Signed)
Medical Nutrition Therapy:  Appt start time: 0930 end time:  1030.  [employer] [employer] visit #1 of 3  Assessment:  Primary concerns today: Pt states she was diagnosed with PCOS after her 3rd pregnancy. Pt denies typical symptoms such as irregular periods, infertility or cysts. Pt states she had weight gain, particularly around the midsection, thinning hair, and some excess hair growth on arms and chin. Pt reports her testosterone levels are high and was prescribed spironolactone.   Pt states she feels very fit, she and her husband participate in 1 or 2 half-marathons each year. 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 Pt states when she started metformin she lost a little weight but the weight loss stopped.  Pt states concerns about body image mostly due to the fear of creating harmful messages about body image for her young daughter (pre-K) who has already experienced weight stigma comments from peers. Pt states she listens to pod casts and plans to listen to shesallfat to help navigate positive self-image in a fat-phobic world.  Patient is interested in some good snack ideas. Pt states she has an issue with frequent low blood sugar, but states often is asymptomatic until it is very low in the 50s. Pt likely has high insulin levels with PCOS and may be bottoming out due to following a low carb diet and going too long in between meals.  Pt states she enjoys social events with friends, having wine with friends by her pool, and had concern about alcohol (wine) intake and the effect on weight. Pt states her personal trainer told her that alcohol doesn't create weight gain due to calories but because the metabolism is prioritizing metabolizing the alcohol and the fat gets stored during alcohol metabolism (something like that).  Per  chart Hx of Vit D deficiency. Pt states she does not take vit D supplement in the summer, gets a lot of time in the sun. Pt states she is more likely to take supplements in the winter.  Energy level: 7-8/10; sometimes son can't sleep through night then 3/10. Sleep: 10:30-6:30; 6-8 hrs (depends on son's health status)  Preferred Learning Style:   No preference indicated   Learning Readiness:   Ready  MEDICATIONS: reviewed (metformin for PCOS, spiranolactone for thinning hair)   DIETARY INTAKE:  Usual eating pattern includes low carb meals and snacks per day.  24-hr recall:  B ( AM): egg, Ezekiel bread, tomato OR hard boiled egg, fruit  Snk ( AM): none  L ( PM): taco salad OR quinoa, bean salad OR [place] & cheese rolled up, apple PB Snk ( PM): string cheese OR apples PB D ( PM): ground [place], lettuce, greek yogurt, corn, blk beans, tomatoes Snk ( PM): none OR decaf tea (to curb desire to eat) OR cheese stick OR pretzels, hummus Beverages: coffee, stevia am, water, 1x month diet coke, glass of wine on weekends  Usual physical activity: boot camp 3-4x week, 1-2x/ week runs   Estimated energy needs: 2000-2100 calories  Progress Towards Goal(s):  In progress.   Nutritional Diagnosis:  NI-5.8.1 Inadequate carbohydrate intake As related to low cab diet.  As evidenced by frequent low blood sugar.    Intervention:  Nutrition Education. Discussed PCOS effect on insulin and glucose metabolism and protein role in helping to  stabilize insulin and carbs to prevent low BG. Discussed role of snacks.   Teaching Method Utilized:  Visual Auditory  Handouts given during visit include:  Snack sheet  Barriers to learning/adherence to lifestyle change: none  Demonstrated degree of understanding via:  Teach Back   Monitoring/Evaluation:  Dietary intake, exercise, hypoglycemic events, and body weight in 1 week(s).

## 2018-01-03 ENCOUNTER — Telehealth: Payer: 59 | Admitting: Family

## 2018-01-03 DIAGNOSIS — J209 Acute bronchitis, unspecified: Secondary | ICD-10-CM

## 2018-01-03 MED ORDER — PREDNISONE 10 MG (21) PO TBPK: ORAL_TABLET | ORAL | 0 refills | Status: DC

## 2018-01-03 NOTE — Progress Notes (Signed)

## 2018-01-08 ENCOUNTER — Encounter: Payer: Self-pay | Admitting: Family Medicine

## 2018-01-08 ENCOUNTER — Ambulatory Visit (INDEPENDENT_AMBULATORY_CARE_PROVIDER_SITE_OTHER): Payer: 59 | Admitting: Family Medicine

## 2018-01-08 DIAGNOSIS — R059 Cough, unspecified: Secondary | ICD-10-CM | POA: Insufficient documentation

## 2018-01-08 DIAGNOSIS — R05 Cough: Secondary | ICD-10-CM

## 2018-01-08 MED ORDER — BENZONATATE 200 MG PO CAPS: 200.0000 mg | ORAL_CAPSULE | Freq: Two times a day (BID) | ORAL | 0 refills | Status: DC | PRN

## 2018-01-08 MED ORDER — AZITHROMYCIN 250 MG PO TABS: ORAL_TABLET | ORAL | 0 refills | Status: DC

## 2018-01-08 MED ORDER — GUAIFENESIN-CODEINE 100-10 MG/5ML PO SYRP: 5.0000 mL | ORAL_SOLUTION | Freq: Every evening | ORAL | 0 refills | Status: DC | PRN

## 2018-01-08 NOTE — Progress Notes (Signed)
   Subjective:    Patient ID: Jamie Nelson, female    DOB: [birth year], 37 y.o.   MRN: [id number]  Cough  This is a new problem. The current episode started 1 to 4 weeks ago ( 1 week ago). The problem has been gradually worsening. The cough is productive of sputum. Associated symptoms include a fever. Pertinent negatives include no ear pain, myalgias, nasal congestion, sore throat, shortness of breath or wheezing. Associated symptoms comments: Fever for 3 days initially, now progressed to laryngitis, constant cough,. The symptoms are aggravated by lying down. Risk factors:  sick contact of son with viral infeciton, non smoker. She has tried OTC cough suppressant ( ibuprofen, tyelnol,) for the symptoms. The treatment provided mild relief. There is no history of asthma, COPD, environmental allergies or pneumonia.   PCOS   E-visit tylenol, delsym and pred taper   no antibiotics in last 30 days.  Blood pressure 118/82, pulse 74, temperature 98.6 F (37 C), temperature source Oral, resp. rate 16, height 5\' 5"  (1.651 m), weight 191 lb (86.6 kg), last menstrual period 12/26/2017, SpO2 97 %, unknown if currently breastfeeding. Social History /Family History/Past Medical History reviewed in detail and updated in EMR if needed.   Review of Systems  Constitutional: Positive for fever.  HENT: Negative for ear pain and sore throat.   Respiratory: Positive for cough. Negative for shortness of breath and wheezing.   Musculoskeletal: Negative for myalgias.  Allergic/Immunologic: Negative for environmental allergies.         Objective:   Physical Exam  Constitutional: Vital signs are normal. She appears well-developed and well-nourished. She is cooperative.  Non-toxic appearance. She does not appear ill. No distress.  HENT:  Head: Normocephalic.  Right Ear: Hearing, tympanic membrane, external ear and ear canal normal. Tympanic membrane is not erythematous, not retracted and not bulging.  Left  Ear: Hearing, tympanic membrane, external ear and ear canal normal. Tympanic membrane is not erythematous, not retracted and not bulging.  Nose: No mucosal edema or rhinorrhea. Right sinus exhibits no maxillary sinus tenderness and no frontal sinus tenderness. Left sinus exhibits no maxillary sinus tenderness and no frontal sinus tenderness.  Mouth/Throat: Uvula is midline, oropharynx is clear and moist and mucous membranes are normal.  Hoarse voice and frequent cough  Eyes: Pupils are equal, round, and reactive to light. Conjunctivae, EOM and lids are normal. Lids are everted and swept, no foreign bodies found.  Neck: Trachea normal and normal range of motion. Neck supple. Carotid bruit is not present. No thyroid mass and no thyromegaly present.  Cardiovascular: Normal rate, regular rhythm, S1 normal, S2 normal, normal heart sounds, intact distal pulses and normal pulses. Exam reveals no gallop and no friction rub.  No murmur heard. Pulmonary/Chest: Effort normal and breath sounds normal. No tachypnea. No respiratory distress. She has no decreased breath sounds. She has no wheezes. She has no rhonchi. She has no rales.  Abdominal: Soft. Normal appearance and bowel sounds are normal. There is no tenderness.  Neurological: She is alert.  Skin: Skin is warm, dry and intact. No rash noted.  Psychiatric: Her speech is normal and behavior is normal. Judgment and thought content normal. Her mood appears not anxious. Cognition and memory are normal. She does not exhibit a depressed mood.          Assessment & Plan:

## 2018-01-08 NOTE — Assessment & Plan Note (Signed)
Viral URI most likely... But if not improving after 3-4 more days given ICU RN.. Can fill rx for antibiotics.  Cough suppressants and symptomatic care given.

## 2018-01-08 NOTE — Patient Instructions (Signed)
Cough suppressant at night.  Can use benzonatate for cough as needed during the day.  Voice rest, humidification.  If not improving as expected can start a course of antibiotics.  Go to ER with shortness of breath.

## 2018-01-10 ENCOUNTER — Encounter: Payer: 59 | Admitting: Registered"

## 2018-01-10 DIAGNOSIS — Z713 Dietary counseling and surveillance: Secondary | ICD-10-CM

## 2018-01-10 NOTE — Progress Notes (Signed)
Medical Nutrition Therapy:  Appt start time: 9767 end time:  1630.  [facility] visit #2 of 3  Assessment:  Primary concerns today: Pt states has started including more protein with breakfast and feels it is making a difference. Pt states she would like more ideas of how she can address PCOS symptoms with diet. RD will inform patient of need for referral from her PCP to have continued visits to address PCOS.  From first visit assessment: Energy level: 7-8/10; sometimes son can't sleep through night then 3/10. Sleep: 10:30-6:30; 6-8 hrs (depends on son's health status)  Preferred Learning Style:   No preference indicated   Learning Readiness:   Ready  MEDICATIONS: reviewed (metformin for PCOS, spiranolactone for thinning hair)   DIETARY INTAKE:  Usual eating pattern includes low carb meals and snacks per day.  24-hr recall:  B ( AM): egg, Ezekiel bread, tomato OR hard boiled egg, fruit  Snk ( AM): none  L ( PM): taco salad OR quinoa, bean salad OR [place] & cheese rolled up, apple PB Snk ( PM): string cheese OR apples PB D ( PM): ground [place], lettuce, greek yogurt, corn, blk beans, tomatoes Snk ( PM): none OR decaf tea (to curb desire to eat) OR cheese stick OR pretzels, hummus Beverages: coffee, stevia am, water, 1x month diet coke, glass of wine on weekends  Usual physical activity: boot camp 3-4x week, 1-2x/ week runs   Estimated energy needs: 2000-2100 calories  Progress Towards Goal(s):  In progress.   Nutritional Diagnosis:  NI-5.8.1 Inadequate carbohydrate intake As related to low cab diet.  As evidenced by frequent low blood sugar.    Intervention:  Nutrition Education. Reviewed protein options and strategies to get in diet. Discussed metabolism of alcohol and no research to tie the delay in fat metabolism resulting in weight gain. Discussed ovasitol for PCOS.   Teaching Method Utilized:  Visual Auditory  Handouts given during visit include:  What is  Inositol  Barriers to learning/adherence to lifestyle change: none  Demonstrated degree of understanding via:  Teach Back   Monitoring/Evaluation:  Dietary intake, exercise, hypoglycemic events, and body weight in 1 week(s).

## 2018-01-11 ENCOUNTER — Encounter: Payer: Self-pay | Admitting: Family Medicine

## 2018-01-18 ENCOUNTER — Encounter: Payer: 59 | Admitting: Registered"

## 2018-01-18 DIAGNOSIS — Z713 Dietary counseling and surveillance: Secondary | ICD-10-CM

## 2018-01-18 NOTE — Patient Instructions (Addendum)
Continue with your plan to try new recipes. If interested in getting more eggplant, ratatouille may be something your family will enjoy. Continue experimenting with getting the right amount of protein. Continue with your activity level as tolerated. Yoga Nidra is a simple form of meditation that does not have to take a lot of time and can be very relaxing, creating a calm mind and body for sleep: [email or web address].pt Many guided meditations can be found on-line.

## 2018-01-18 NOTE — Progress Notes (Signed)
Medical Nutrition Therapy:  Appt start time: 1130 end time:  1200.  [facility] visit #3 of 3  Assessment:  Primary concerns today: Pt states protein is making a difference in energy, but still is having lulls in afternoons, sometimes will have iced coffee. Patient's reported meal preparations include balance of food groups. Pt states she worries about getting tired of [place] burger and would like to work on getting more color in her meals.  Pt states life has been very hectic getting her children back in school. 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 Pt talked about the importance of self-care and would like more support getting more self-care time/opportunities.  Preferred Learning Style:   No preference indicated   Learning Readiness:   Ready  MEDICATIONS: reviewed   DIETARY INTAKE:  Usual eating pattern includes low carb meals and snacks per day.  24-hr recall:  B ( AM): 2 eggs, spinach, cherry tomatoes.  Snk ( AM): cheese stick, grapes  L ( PM): [place] sandwich, grain bread, tomato, cucumbers humus Snk ( PM): popcorn D ( PM): crock pot chicken fajita, peppers onion, plain [place] yogurt instead of sour cream, black beans.  Snk ( PM): none  Beverages: coffee, stevia am, water, 1x month diet coke, glass of wine on weekends  Usual physical activity: boot camp 3-4x week, 1-2x/ week runs   Estimated energy needs: 2000-2100 calories  Progress Towards Goal(s):  In progress.   Nutritional Diagnosis:  NI-5.8.1 Inadequate carbohydrate intake As related to low cab diet.  As evidenced by frequent low blood sugar.    Intervention:  Nutrition Education. Discussed ways to meal prep with a base to make different meals. Discussed options for afternoon lull.   Teaching Method Utilized:  Visual Auditory  Handouts given during visit include:  none  Barriers to learning/adherence to lifestyle change: none  Demonstrated degree of  understanding via:  Teach Back   Monitoring/Evaluation:  Dietary intake, exercise, hypoglycemic events, and body weight prn.

## 2018-03-01 DIAGNOSIS — H5213 Myopia, bilateral: Secondary | ICD-10-CM | POA: Diagnosis not present

## 2018-03-28 ENCOUNTER — Encounter

## 2018-03-28 ENCOUNTER — Ambulatory Visit: Payer: 59 | Admitting: Internal Medicine

## 2018-03-30 MED FILL — metFORMIN HCL 500 MG TABS: 500 | 90 days supply | Qty: 360 | Fill #2

## 2018-03-30 MED FILL — SPIRONOLACTONE 50 MG TABLET: 50 | 90 days supply | Qty: 180 | Fill #2

## 2018-05-03 ENCOUNTER — Encounter: Payer: Self-pay | Admitting: Internal Medicine

## 2018-05-03 ENCOUNTER — Ambulatory Visit (INDEPENDENT_AMBULATORY_CARE_PROVIDER_SITE_OTHER): Payer: 59 | Admitting: Internal Medicine

## 2018-05-03 VITALS — BP 120/60 | HR 85 | Ht 65.0 in | Wt 177.0 lb

## 2018-05-03 DIAGNOSIS — E282 Polycystic ovarian syndrome: Secondary | ICD-10-CM | POA: Diagnosis not present

## 2018-05-03 DIAGNOSIS — E278 Other specified disorders of adrenal gland: Secondary | ICD-10-CM

## 2018-05-03 DIAGNOSIS — E559 Vitamin D deficiency, unspecified: Secondary | ICD-10-CM

## 2018-05-03 DIAGNOSIS — E669 Obesity, unspecified: Secondary | ICD-10-CM

## 2018-05-03 DIAGNOSIS — R7989 Other specified abnormal findings of blood chemistry: Secondary | ICD-10-CM

## 2018-05-03 LAB — LIPID PANEL
CHOLESTEROL: 197 mg/dL (ref 0–200)
HDL: 77.6 mg/dL (ref >39.00)
LDL Cholesterol: 109 mg/dL — ABNORMAL HIGH (ref 0–99)
NonHDL: 119.07
TRIGLYCERIDES: 52 mg/dL (ref 0.0–149.0)
Total CHOL/HDL Ratio: 3
VLDL: 10.4 mg/dL (ref 0.0–40.0)

## 2018-05-03 LAB — HEMOGLOBIN A1C: HEMOGLOBIN A1C: 5.1 % (ref 4.6–6.5)

## 2018-05-03 LAB — TSH: TSH: 0.97 u[IU]/mL (ref 0.35–4.50)

## 2018-05-03 LAB — VITAMIN D 25 HYDROXY (VIT D DEFICIENCY, FRACTURES): VITD: 31.12 ng/mL (ref 30.00–100.00)

## 2018-05-03 MED ORDER — METFORMIN HCL ER 500 MG PO TB24: 1000.0000 mg | ORAL_TABLET | Freq: Two times a day (BID) | ORAL | 3 refills | Status: DC

## 2018-05-03 MED ORDER — SPIRONOLACTONE 50 MG PO TABS: 50.0000 mg | ORAL_TABLET | Freq: Two times a day (BID) | ORAL | 3 refills | Status: DC

## 2018-05-03 NOTE — Patient Instructions (Signed)
Please stop at the lab.  Continue Metformin ER 1000 mg 2x a day with meals.  Continue Spironolactone 50 mg 2x a day.  Please return in 1 year.

## 2018-05-03 NOTE — Progress Notes (Signed)
Patient ID: Jamie Nelson, female   DOB: 09-14-1980, 37 y.o.   MRN: [id number]   HPI: Jamie Nelson is a 37 y.o. female, returning for follow-up for PCOS, increased DHEAS, vitamin D deficiency. Last visit 1 year and 4 months ago.  She did wonderful since last visit changing her diet and continuing exercising.  She saw a nutritionist and also started a program called Optivia - meal replacement.  She is now using this-on.  She was able to lose more than 10 pounds since last visit.  Reviewed and addended history: Fertility/Menstrual cycles: -She had regular menses all her life -No history of ovarian cysts - children: 3, one with Down syndrome - miscarriages: 0 - contraception: She was briefly on OCPs in 2010, had tubal ligation in 2013  Acne: - face - on Spironolactone 50 >> in 03/2016, we increased to 50 mg twice a day - on Proactive before >> now "Unblemish"  Hirsutism: -Only chin and neck and few hair shafts above upper lip -Plucks it  Weight gain: -Lost 8 pounds before last visit, and between 10 to 13 pounds since last visit -No steroid use -No weight loss meds - Diets tried: Tried the "ideal protein diet" >> no success; started Optivia pgm.  Since last visit.  She uses this off and on. - Exercise: Running, she ran half- marathons in 2017; still running 2-3 times a week and also doing [employer].  Continues to train for half marathons.  Treatments tried: -Continues on metformin ER 1000 mg twice a day started 03/2016- lost weight; tolerates this well -We started spironolactone 50 mg twice a day in 03/2016-acne improved -Did not try [place] -Not on OCPs.  Reviewed pertinent labs - DHEAs improved at last check, as was the free testosterone: Component     Latest Ref Rng & Units 12/22/2016 04/01/2017  Testosterone     8 - 48 ng/dL 47   Testosterone Free     0.0 - 4.2 pg/mL 3.4   Sex Horm Binding Glob, Serum     24.6 - 122.0 nmol/L 47.9   DHEA-SO4     23 - 266 ug/dL 482 (H)    DHEA-Sulfate, LCMS     ug/dL  302 (H)    Component     Latest Ref Rng & Units 03/30/2016  Testosterone     8 - 48 ng/dL 41  Testosterone Free     0.0 - 4.2 pg/mL 5.2 (H)  Sex Horm Binding Glob, Serum     24.6 - 122.0 nmol/L 56.9  DHEA-SO4     23 - 266 ug/dL 278 (H)  17-OH-Progesterone, LC/MS/MS      ng/dL 137   10/14/2015: - Prolactin 7.64 - HbA1c 5.3% - A.m. cortisol 10.2 - LH 5, FSH 2 - Free testosterone 2.1 (0-4.2) - DHEAS 298.7 (57.3-279.2)  -Thyroid test were normal Lab Results  Component Value Date   TSH 0.86 06/04/2017   FREET4 0.80 03/30/2016   -+ HL. Last set of lipids:    Component Value Date/Time   CHOL 213 (H) 06/04/2017 1055   TRIG 107.0 06/04/2017 1055   HDL 67.90 06/04/2017 1055   CHOLHDL 3 06/04/2017 1055   VLDL 21.4 06/04/2017 1055   LDLCALC 124 (H) 06/04/2017 1055   She has a history of mild transaminitis which improved at last check: Lab Results  Component Value Date   AST 23 06/04/2017   AST 34 (H) 12/22/2016   ALT 22 06/04/2017   ALT 29 12/22/2016  She has no family history of diabetes.   Vit D insufficiency.   Latest vitamin D level was still slightly low: Lab Results  Component Value Date   VD25OH 28.83 (L) 06/04/2017   VD25OH 39.20 12/22/2016   VD25OH 24.38 (L) 03/30/2016   Continues on 2000 units vitamin D daily - remembers 5x a week.  ROS: Constitutional: no weight gain/no weight loss, no fatigue, no subjective hyperthermia, no subjective hypothermia Eyes: no blurry vision, no xerophthalmia ENT: no sore throat, no nodules palpated in neck, no dysphagia, no odynophagia, no hoarseness Cardiovascular: no CP/no SOB/no palpitations/no leg swelling Respiratory: no cough/no SOB/no wheezing Gastrointestinal: no N/no V/no D/no C/no acid reflux Musculoskeletal: no muscle aches/no joint aches Skin: no rashes, no hair loss Neurological: no tremors/no numbness/no tingling/no dizziness  I reviewed pt's medications, allergies,  PMH, social hx, family hx, and changes were documented in the history of present illness. Otherwise, unchanged from my initial visit note.  Past Medical History:  Diagnosis Date  . ALLERGIC RHINITIS   . ANXIETY    hx - no meds  . ARTHROSCOPY, KNEE, HX OF   . DEPRESSION    hx - no meds  . Family history of breast cancer   . Family history of colon cancer   . Family history of colonic polyps 07/27/2017  . Family history of lung cancer   . Family history of pancreatic cancer   . HEPATIC CYST   . PCOS (polycystic ovarian syndrome)   . Postpartum care following cesarean delivery 02/26/2012  . S/P cesarean section (10/4) 02/26/2012  . Seasonal allergies   . WISDOM TEETH EXTRACTION, HX OF    Past Surgical History:  Procedure Laterality Date  . Benign liver cyst removed  1988  . CESAREAN SECTION  09/2008  . CESAREAN SECTION  02/26/2012   Procedure: CESAREAN SECTION;  Surgeon: Claiborne Billings A. Pamala Hurry, MD;  Location: [facility];  Service: Obstetrics;  Laterality: N/A;  EDD: 02/27/12  . CESAREAN SECTION WITH BILATERAL TUBAL LIGATION Bilateral 01/30/2014   Procedure: Repeat CESAREAN SECTION WITH TUBAL LIGATION;  Surgeon: Claiborne Billings A. Pamala Hurry, MD;  Location: [facility];  Service: Obstetrics;  Laterality: Bilateral;  EDD: 02/03/14  . KNEE ARTHROSCOPY     left   . SCAR REVISION N/A 01/30/2014   Procedure: SCAR REVISION;  Surgeon: Claiborne Billings A. Pamala Hurry, MD;  Location: [facility];  Service: Obstetrics;  Laterality: N/A;  . wisdom teeth extraction  2004   Social History   Social History  . Marital status: Married    Spouse name: N/A  . Number of children: 3   Occupational History  . RN   Social History Main Topics  . Smoking status: Never Smoker  . Smokeless tobacco: Never Used     Comment: Married, lives with spouse Herbie Baltimore who was Chief Executive Officer of hospitalist service)   . Alcohol use Yes     Comment: socially but none with pregnancy  . Drug use: No   Current Outpatient Medications on File Prior to Visit   Medication Sig Dispense Refill  . azithromycin (ZITHROMAX) 250 MG tablet 2 tab po x 1 day then 1 tab po daily 6 tablet 0  . benzonatate (TESSALON) 200 MG capsule Take 1 capsule (200 mg total) by mouth 2 (two) times daily as needed for cough. 20 capsule 0  . guaiFENesin-codeine (ROBITUSSIN AC) 100-10 MG/5ML syrup Take 5-10 mLs by mouth at bedtime as needed for cough. 180 mL 0  . metFORMIN (GLUCOPHAGE-XR) 500 MG 24 hr tablet Take  1,000 mg by mouth 2 (two) times daily.    . predniSONE (STERAPRED UNI-PAK 21 TAB) 10 MG (21) TBPK tablet As durected (Patient not taking: Reported on 01/08/2018) 21 tablet 0  . spironolactone (ALDACTONE) 50 MG tablet TAKE 1 TABLET BY MOUTH TWICE DAILY 180 tablet 3  . Vitamin D, Ergocalciferol, (DRISDOL) 50000 units CAPS capsule Take 1 capsule (50,000 Units total) by mouth every 7 (seven) days. 12 capsule 0   No current facility-administered medications on file prior to visit.    Allergies  Allergen Reactions  . Nitrofurantoin     REACTION: Hives   Family History  Problem Relation Age of Onset  . Colon cancer Father 43       dx at stage IV  . Lung cancer Maternal Grandmother   . Breast cancer Maternal Aunt 60       took HRT  . Stroke Paternal Grandmother   . Diabetes Paternal Grandmother   . Stroke Paternal Grandfather   . Hypertension Mother   . Depression Mother   . Colon polyps Mother        goes every 2 years for c-scope  . Colon cancer Maternal Grandfather 75  . Colon cancer Maternal Uncle 34  . Colon cancer Paternal Uncle        dx late 26's  . Colon cancer Paternal Uncle        dx late 25's  . Colon polyps Paternal Uncle        precancerous  . Pancreatic cancer Other 68   PE: BP 120/60   Pulse 85   Ht 5\' 5"  (1.651 m) Comment: measured  Wt 177 lb (80.3 kg)   SpO2 97%   BMI 29.45 kg/m  Wt Readings from Last 3 Encounters:  05/03/18 177 lb (80.3 kg)  01/08/18 191 lb (86.6 kg)  06/04/17 190 lb 4 oz (86.3 kg)   Constitutional: overweight,  in NAD Eyes: PERRLA, EOMI, no exophthalmos ENT: moist mucous membranes, no thyromegaly, no cervical lymphadenopathy Cardiovascular: RRR, No MRG Respiratory: CTA B Gastrointestinal: abdomen soft, NT, ND, BS+ Musculoskeletal: no deformities, strength intact in all 4 Skin: moist, warm, no rashes, + mild acne on face, + dark terminal hair on chin, + vellum on sideburns, no skin tags, no acanthosis nigricans, no purple, wide, stretch marks Neurological: no tremor with outstretched hands, DTR normal in all 4  ASSESSMENT: 1. PCOS  2. Elevated DHEAS  3. Weight gain  4. Vitamin D def insufficiency  PLAN: 1.  - Patient with history of mild PCOS based on elevated testosterone levels in the past, right before starting spironolactone.  She had difficulty losing weight in the past despite being a runner, but she did wonderful since last visit after starting a diet program.  She is not using a meal replacement program all the time, however, she pays attention to the diet more in the days that she does not follow it. -Labs from last check showed normal LH, FSH, still elevated DHEAS (but improved), and a normalized free testosterone level.  -At last visit, we changed from regular metformin to metformin ER 1000 mg twice a day as she had occasional diarrhea with the regular formulation.  She tolerates this very well. -She also continues on spironolactone.  She is using 50 mg twice a day, which she tolerates without dizziness and without low blood pressure.  She feels that her acne and hirsutism improved after starting spironolactone.  We will continue with this. -Her menstrual cycles remain regular.  She has a history of tubal ligation would like to avoid OCPs -We will check the following labs today: CMP, HbA1c, DHEAS, vitamin D -We again discussed that in the setting of spironolactone treatment, testosterone levels are not completely reliable -I will advise her to return in 1 year for recheck  2.  Elevated DHEAS   -Not unusual in the setting of PCOS -At last visit he was slightly higher, which could be due to exercise.  We repeated a value at the end of last year and the liver was decreased (03/2017). -We will recheck this today  3. Weight gain -Excellent improvement in weight since last visit: 10 to 13 pounds lost -Improved after starting metformin and also after improving her diet.  She is also very active, exercising consistently.  4. Vitamin D  insufficiency  -She has a history of low vitamin D, 24 -She was previously on 2000 units vitamin D daily, but stopped before last visit.  We restarted Ms. at last visit and she is taking it approximately 5 out of 7 days as she forgets.  Discussed about putting it in a pillbox. -At last check, vitamin D was still slightly low at 28.8 in 05/2017. -We will recheck today  Office Visit on 05/03/2018  Component Date Value Ref Range Status  . Hgb A1c MFr Bld 05/03/2018 5.1  4.6 - 6.5 % Final   Glycemic Control Guidelines for People with Diabetes:Non Diabetic:  <6%Goal of Therapy: <7%Additional Action Suggested:  >8%   . Cholesterol 05/03/2018 197  0 - 200 mg/dL Final   ATP III Classification       Desirable:  < 200 mg/dL               Borderline High:  200 - 239 mg/dL          High:  > = 240 mg/dL  . Triglycerides 05/03/2018 52.0  0.0 - 149.0 mg/dL Final   Normal:  <150 mg/dLBorderline High:  150 - 199 mg/dL  . HDL 05/03/2018 77.60  >39.00 mg/dL Final  . VLDL 05/03/2018 10.4  0.0 - 40.0 mg/dL Final  . LDL Cholesterol 05/03/2018 109* 0 - 99 mg/dL Final  . Total CHOL/HDL Ratio 05/03/2018 3   Final                  Men          Women1/2 Average Risk     3.4          3.3Average Risk          5.0          4.42X Average Risk          9.6          7.13X Average Risk          15.0          11.0                      . NonHDL 05/03/2018 119.07   Final   NOTE:  Non-HDL goal should be 30 mg/dL higher than patient's LDL goal (i.e. LDL goal of < 70 mg/dL,  would have non-HDL goal of < 100 mg/dL)  . Glucose, Bld 05/03/2018 74  65 - 99 mg/dL Final   Comment: .            Fasting reference interval .   . BUN 05/03/2018 14  7 - 25 mg/dL Final  . Creat 05/03/2018 0.88  0.50 - 1.10 mg/dL Final  . GFR, Est Non African American 05/03/2018 83  > OR = 60 mL/min/1.63m2 Final  . GFR, Est African American 05/03/2018 97  > OR = 60 mL/min/1.32m2 Final  . BUN/Creatinine Ratio 38/88/2800 NOT APPLICABLE  6 - 22 (calc) Final  . Sodium 05/03/2018 138  135 - 146 mmol/L Final  . Potassium 05/03/2018 4.3  3.5 - 5.3 mmol/L Final  . Chloride 05/03/2018 102  98 - 110 mmol/L Final  . CO2 05/03/2018 28  20 - 32 mmol/L Final  . Calcium 05/03/2018 9.9  8.6 - 10.2 mg/dL Final  . Total Protein 05/03/2018 7.1  6.1 - 8.1 g/dL Final  . Albumin 05/03/2018 4.4  3.6 - 5.1 g/dL Final  . Globulin 05/03/2018 2.7  1.9 - 3.7 g/dL (calc) Final  . AG Ratio 05/03/2018 1.6  1.0 - 2.5 (calc) Final  . Total Bilirubin 05/03/2018 0.4  0.2 - 1.2 mg/dL Final  . Alkaline phosphatase (APISO) 05/03/2018 72  33 - 115 U/L Final  . AST 05/03/2018 19  10 - 30 U/L Final  . ALT 05/03/2018 18  6 - 29 U/L Final  . TSH 05/03/2018 0.97  0.35 - 4.50 uIU/mL Final  . VITD 05/03/2018 31.12  30.00 - 100.00 ng/mL Final  . DHEA-Sulfate, LCMS 05/03/2018 334* ug/dL Final   Comment: Reference Range: Adult Females (31 - 40y): 17 - 286     DHEAS only slightly higher than before, rest of the labs normal, except LDL  - slightly high, but improved.  Philemon Kingdom, MD PhD [facility]

## 2018-05-04 LAB — COMPLETE METABOLIC PANEL WITH GFR
AG Ratio: 1.6 (calc) (ref 1.0–2.5)
ALBUMIN MSPROF: 4.4 g/dL (ref 3.6–5.1)
ALKALINE PHOSPHATASE (APISO): 72 U/L (ref 33–115)
ALT: 18 U/L (ref 6–29)
AST: 19 U/L (ref 10–30)
BILIRUBIN TOTAL: 0.4 mg/dL (ref 0.2–1.2)
BUN: 14 mg/dL (ref 7–25)
CALCIUM: 9.9 mg/dL (ref 8.6–10.2)
CHLORIDE: 102 mmol/L (ref 98–110)
CO2: 28 mmol/L (ref 20–32)
CREATININE: 0.88 mg/dL (ref 0.50–1.10)
GFR, EST AFRICAN AMERICAN: 97 mL/min/{1.73_m2} (ref >=60)
GFR, EST NON AFRICAN AMERICAN: 83 mL/min/{1.73_m2} (ref >=60)
GLUCOSE: 74 mg/dL (ref 65–99)
Globulin: 2.7 g/dL (calc) (ref 1.9–3.7)
POTASSIUM: 4.3 mmol/L (ref 3.5–5.3)
Sodium: 138 mmol/L (ref 135–146)
TOTAL PROTEIN: 7.1 g/dL (ref 6.1–8.1)

## 2018-05-07 LAB — DHEA-SULFATE, SERUM: DHEA-Sulfate, LCMS: 334 ug/dL — ABNORMAL HIGH

## 2018-05-29 ENCOUNTER — Telehealth: Payer: 59 | Admitting: Family

## 2018-05-29 DIAGNOSIS — R399 Unspecified symptoms and signs involving the genitourinary system: Secondary | ICD-10-CM

## 2018-05-29 MED ORDER — CEPHALEXIN 500 MG PO CAPS: 500.0000 mg | ORAL_CAPSULE | Freq: Two times a day (BID) | ORAL | 0 refills | Status: DC

## 2018-05-29 NOTE — Progress Notes (Signed)

## 2018-08-26 ENCOUNTER — Encounter: Payer: Self-pay | Admitting: Family Medicine

## 2018-08-26 ENCOUNTER — Other Ambulatory Visit: Payer: Self-pay

## 2018-08-26 ENCOUNTER — Ambulatory Visit (INDEPENDENT_AMBULATORY_CARE_PROVIDER_SITE_OTHER): Payer: 59 | Admitting: Family Medicine

## 2018-08-26 ENCOUNTER — Telehealth: Payer: Self-pay

## 2018-08-26 VITALS — HR 89 | Temp 98.6°F | Ht 65.0 in | Wt 182.0 lb

## 2018-08-26 DIAGNOSIS — F419 Anxiety disorder, unspecified: Secondary | ICD-10-CM | POA: Diagnosis not present

## 2018-08-26 MED ORDER — ALPRAZOLAM 0.5 MG PO TABS: 0.5000 mg | ORAL_TABLET | Freq: Two times a day (BID) | ORAL | 1 refills | Status: DC | PRN

## 2018-08-26 MED ORDER — SERTRALINE HCL 25 MG PO TABS: 25.0000 mg | ORAL_TABLET | Freq: Every day | ORAL | 3 refills | Status: DC

## 2018-08-26 MED FILL — ALPRAZolam 0.5 MG TABS: 0.5 | 15 days supply | Qty: 30 | Fill #0

## 2018-08-26 MED FILL — SERTRALINE HCL 25 MG TABLET: 25 | 30 days supply | Qty: 30 | Fill #0

## 2018-08-26 NOTE — Telephone Encounter (Signed)
Unable to LM due to MB being full. I sent pt a mychart message to call the office to schedule.

## 2018-08-26 NOTE — Progress Notes (Signed)
Virtual Visit via Video   I connected with@ on 08/26/18 at  4:00 PM EDT by a video enabled telemedicine application and verified that I am speaking with the correct person using two identifiers. Location patient: Home Location provider: Acupuncturist, Office Persons participating in the virtual visit: patient and myself  I discussed the limitations of evaluation and management by telemedicine and the availability of in person appointments. The patient expressed understanding and agreed to proceed.  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  She is fearful of having to go to the [place] unit as she has a special needs child at home.  The system is not letting nurses mask for all pts at this time and fear is understandably high.  She has already seen colleagues go down with this virus.  ROS: See pertinent positives and negatives per HPI.  Patient Active Problem List   Diagnosis Date Noted  . Cough 01/08/2018  . Nutritional counseling 12/30/2017  . Genetic testing 08/26/2017  . Family history of colonic polyps 07/27/2017  . Family history of colon cancer   . Family history of pancreatic cancer   . Family history of breast cancer   . Family history of lung cancer   . Obesity (BMI 30.0-34.9) 06/04/2017  . Family history of colon cancer in father 06/04/2017  . Elevated DHEA ([place]) 12/22/2016  . Vitamin D insufficiency 12/22/2016  . UTI (urinary tract infection) 07/08/2016  . PCOS (polycystic ovarian syndrome) 03/30/2016  . Routine general medical examination at a health care facility 07/11/2014  . ANXIETY 11/08/2009  . Depression 11/08/2009  . ALLERGIC RHINITIS 03/27/2009  . HEPATIC CYST 03/27/2009    Social History   Tobacco Use  . Smoking status: Never Smoker  . Smokeless tobacco: Never Used  . Tobacco comment: Married, lives with spouse Herbie Baltimore who is Chief Executive Officer of hospitalist service) and son Barnabas Lister  who has down symdrome  Substance Use Topics  . Alcohol use: Yes    Comment: socially but none with pregnancy    Current Outpatient Medications:  .  metFORMIN (GLUCOPHAGE-XR) 500 MG 24 hr tablet, Take 2 tablets (1,000 mg total) by mouth 2 (two) times daily., Disp: 360 tablet, Rfl: 3 .  spironolactone (ALDACTONE) 50 MG tablet, Take 1 tablet (50 mg total) by mouth 2 (two) times daily., Disp: 180 tablet, Rfl: 3 .  cephALEXin (KEFLEX) 500 MG capsule, Take 1 capsule (500 mg total) by mouth 2 (two) times daily., Disp: 14 capsule, Rfl: 0 .  Vitamin D, Ergocalciferol, (DRISDOL) 50000 units CAPS capsule, Take 1 capsule (50,000 Units total) by mouth every 7 (seven) days. (Patient not taking: Reported on 05/03/2018), Disp: 12 capsule, Rfl: 0  Allergies  Allergen Reactions  . Nitrofurantoin     REACTION: Hives    Objective:   Pulse 89   Temp 98.6 F (37 C)   Ht 5\' 5"  (1.651 m)   Wt 182 lb (82.6 kg)   BMI 30.29 kg/m  AAOx3, NAD NCAT, EOMI No obvious CN deficits Coloring WNL Pt is able to speak clearly, coherently without shortness of breath or increased work of breathing.  Thought process is linear.  Mood is appropriate.   Assessment and Plan:   Anxiety- new.  Severe but appropriate given the situation.  Discussed tele-counseling.  Will start daily controller medication in addition to providing Alprazolam for rescue use.  Total time spent w/ pt listening and counseling on anxiety,  fear, coping strategies, medication use and possible side effects was 32 minutes, 100% spent counseling.   Annye Asa, MD 08/26/2018

## 2018-08-26 NOTE — Telephone Encounter (Signed)
Copied from [place] [id number]. Topic: Appointment Scheduling - Scheduling Inquiry for Clinic >> Aug 25, 2018  4:42 PM Mcneil, Jacinto Reap wrote: Reason for CRM: Pt stated she is experiencing anxiety and she would like to discuss getting a medication to help her get through her work shift. Pt stated she has taken Xanax in the past but it makes her sleepy so she would need something that will not make her sleepy. Pt would like to schedule a virtual visit. Pt verified e-mail address and phone #. Cb# [phone]

## 2018-08-26 NOTE — Progress Notes (Signed)
I have discussed the procedure for the virtual visit with the patient who has given consent to proceed with assessment and treatment.   Trayvond Viets, CMA     

## 2018-09-08 ENCOUNTER — Ambulatory Visit (INDEPENDENT_AMBULATORY_CARE_PROVIDER_SITE_OTHER): Payer: 59 | Admitting: Family Medicine

## 2018-09-08 ENCOUNTER — Encounter: Payer: Self-pay | Admitting: Family Medicine

## 2018-09-08 ENCOUNTER — Other Ambulatory Visit: Payer: Self-pay

## 2018-09-08 VITALS — BP 122/76 | HR 64 | Temp 98.5°F

## 2018-09-08 DIAGNOSIS — F411 Generalized anxiety disorder: Secondary | ICD-10-CM | POA: Diagnosis not present

## 2018-09-08 NOTE — Progress Notes (Signed)
Virtual Visit via Video   I connected with Jamie Nelson on 09/08/18 at  1:40 PM EDT by a video enabled telemedicine application and verified that I am speaking with the correct person using two identifiers. Location patient: Home Location provider: Acupuncturist, Office Persons participating in the virtual visit: pt and myself  I discussed the limitations of evaluation and management by telemedicine and the availability of in person appointments. The patient expressed understanding and agreed to proceed.  Subjective:   HPI:  Anxiety- pt was seen on 4/3 and was having severe anxiety.  Was started on Sertraline daily and Alprazolam prn.  'lots better than I was'.  Denies side effects from medication.  No longer 'crying at the drop of a hat'.  Has only used 1/2 xanax x1 dose.  'I feel back in control'.  ROS: See pertinent positives and negatives per HPI.  Patient Active Problem List   Diagnosis Date Noted  . Cough 01/08/2018  . Nutritional counseling 12/30/2017  . Genetic testing 08/26/2017  . Family history of colonic polyps 07/27/2017  . Family history of colon cancer   . Family history of pancreatic cancer   . Family history of breast cancer   . Family history of lung cancer   . Obesity (BMI 30.0-34.9) 06/04/2017  . Family history of colon cancer in father 06/04/2017  . Elevated DHEA ([place]) 12/22/2016  . Vitamin D insufficiency 12/22/2016  . UTI (urinary tract infection) 07/08/2016  . PCOS (polycystic ovarian syndrome) 03/30/2016  . Routine general medical examination at a health care facility 07/11/2014  . ANXIETY 11/08/2009  . Depression 11/08/2009  . ALLERGIC RHINITIS 03/27/2009  . HEPATIC CYST 03/27/2009    Social History   Tobacco Use  . Smoking status: Never Smoker  . Smokeless tobacco: Never Used  . Tobacco comment: Married, lives with spouse Herbie Baltimore who is Chief Executive Officer of hospitalist service) and son Barnabas Lister who has down symdrome  Substance Use  Topics  . Alcohol use: Yes    Comment: socially but none with pregnancy    Current Outpatient Medications:  .  ALPRAZolam (XANAX) 0.5 MG tablet, Take 1 tablet (0.5 mg total) by mouth 2 (two) times daily as needed for anxiety., Disp: 30 tablet, Rfl: 1 .  cephALEXin (KEFLEX) 500 MG capsule, Take 1 capsule (500 mg total) by mouth 2 (two) times daily., Disp: 14 capsule, Rfl: 0 .  metFORMIN (GLUCOPHAGE-XR) 500 MG 24 hr tablet, Take 2 tablets (1,000 mg total) by mouth 2 (two) times daily., Disp: 360 tablet, Rfl: 3 .  sertraline (ZOLOFT) 25 MG tablet, Take 1 tablet (25 mg total) by mouth daily., Disp: 30 tablet, Rfl: 3 .  spironolactone (ALDACTONE) 50 MG tablet, Take 1 tablet (50 mg total) by mouth 2 (two) times daily., Disp: 180 tablet, Rfl: 3 .  Vitamin D, Ergocalciferol, (DRISDOL) 50000 units CAPS capsule, Take 1 capsule (50,000 Units total) by mouth every 7 (seven) days., Disp: 12 capsule, Rfl: 0  Allergies  Allergen Reactions  . Nitrofurantoin     REACTION: Hives    Objective:   BP 122/76   Pulse 64   Temp 98.5 F (36.9 C) (Oral)  AAOx3, NAD NCAT, EOMI No obvious CN deficits Coloring WNL Pt is able to speak clearly, coherently without shortness of breath or increased work of breathing.  Thought process is linear.  Mood is appropriate.   Assessment and Plan:   Anxiety- much improved since starting Sertraline.  Has only used Xanax one time.  Feels  she is in much more control of her emotions.  Discussed that we can increase the dose in the future if needed.  She is aware and will let me know.     Annye Asa, MD 09/08/2018

## 2018-10-06 MED FILL — SERTRALINE HCL 25 MG TABLET: 25 | 30 days supply | Qty: 30 | Fill #1

## 2018-10-28 MED FILL — metFORMIN HCL ER 500 MG TB2: 500 | 90 days supply | Qty: 360 | Fill #0

## 2018-10-28 MED FILL — SPIRONOLACTONE 50 MG TABLET: 50 | 90 days supply | Qty: 180 | Fill #0

## 2018-11-18 MED FILL — SERTRALINE HCL 25 MG TABLET: 25 | 30 days supply | Qty: 30 | Fill #0

## 2018-12-05 ENCOUNTER — Encounter: Payer: Self-pay | Admitting: Family Medicine

## 2018-12-06 MED ORDER — SERTRALINE HCL 50 MG PO TABS: 50.0000 mg | ORAL_TABLET | Freq: Every day | ORAL | 3 refills | Status: DC

## 2018-12-06 MED FILL — SERTRALINE HCL 50 MG TABLET: 50 | 30 days supply | Qty: 30 | Fill #0

## 2019-01-10 MED FILL — SERTRALINE HCL 50 MG TABLET: 50 | 30 days supply | Qty: 30 | Fill #1

## 2019-02-13 MED FILL — SERTRALINE HCL 50 MG TABLET: 50 | 30 days supply | Qty: 30 | Fill #2

## 2019-03-13 DIAGNOSIS — H21239 Degeneration of iris (pigmentary), unspecified eye: Secondary | ICD-10-CM | POA: Diagnosis not present

## 2019-03-13 DIAGNOSIS — H21233 Degeneration of iris (pigmentary), bilateral: Secondary | ICD-10-CM | POA: Diagnosis not present

## 2019-03-13 DIAGNOSIS — H5213 Myopia, bilateral: Secondary | ICD-10-CM | POA: Diagnosis not present

## 2019-03-13 DIAGNOSIS — H52221 Regular astigmatism, right eye: Secondary | ICD-10-CM | POA: Diagnosis not present

## 2019-03-20 MED FILL — SERTRALINE HCL 50 MG TABLET: 50 | 30 days supply | Qty: 30 | Fill #3

## 2019-03-22 DIAGNOSIS — Z01419 Encounter for gynecological examination (general) (routine) without abnormal findings: Secondary | ICD-10-CM | POA: Diagnosis not present

## 2019-03-22 DIAGNOSIS — Z6832 Body mass index (BMI) 32.0-32.9, adult: Secondary | ICD-10-CM | POA: Diagnosis not present

## 2019-04-05 ENCOUNTER — Other Ambulatory Visit: Payer: Self-pay | Admitting: Obstetrics & Gynecology

## 2019-04-05 ENCOUNTER — Encounter: Payer: Self-pay | Admitting: Internal Medicine

## 2019-04-05 DIAGNOSIS — N9089 Other specified noninflammatory disorders of vulva and perineum: Secondary | ICD-10-CM

## 2019-04-10 DIAGNOSIS — H5213 Myopia, bilateral: Secondary | ICD-10-CM | POA: Diagnosis not present

## 2019-04-10 DIAGNOSIS — H52221 Regular astigmatism, right eye: Secondary | ICD-10-CM | POA: Diagnosis not present

## 2019-04-10 DIAGNOSIS — H40053 Ocular hypertension, bilateral: Secondary | ICD-10-CM | POA: Diagnosis not present

## 2019-04-10 DIAGNOSIS — H40013 Open angle with borderline findings, low risk, bilateral: Secondary | ICD-10-CM | POA: Diagnosis not present

## 2019-04-10 DIAGNOSIS — H21233 Degeneration of iris (pigmentary), bilateral: Secondary | ICD-10-CM | POA: Diagnosis not present

## 2019-04-24 ENCOUNTER — Other Ambulatory Visit: Payer: 59

## 2019-04-28 DIAGNOSIS — H21233 Degeneration of iris (pigmentary), bilateral: Secondary | ICD-10-CM | POA: Diagnosis not present

## 2019-04-28 DIAGNOSIS — H40053 Ocular hypertension, bilateral: Secondary | ICD-10-CM | POA: Diagnosis not present

## 2019-04-28 DIAGNOSIS — H40013 Open angle with borderline findings, low risk, bilateral: Secondary | ICD-10-CM | POA: Diagnosis not present

## 2019-05-01 ENCOUNTER — Ambulatory Visit: Payer: 59 | Admitting: Internal Medicine

## 2019-05-05 ENCOUNTER — Other Ambulatory Visit: Payer: Self-pay | Admitting: Internal Medicine

## 2019-05-05 ENCOUNTER — Telehealth: Payer: Self-pay | Admitting: Internal Medicine

## 2019-05-05 ENCOUNTER — Other Ambulatory Visit: Payer: Self-pay | Admitting: Family Medicine

## 2019-05-05 MED ORDER — SPIRONOLACTONE 50 MG PO TABS: 50.0000 mg | ORAL_TABLET | Freq: Two times a day (BID) | ORAL | 0 refills | Status: DC

## 2019-05-05 MED ORDER — METFORMIN HCL ER 500 MG PO TB24: 1000.0000 mg | ORAL_TABLET | Freq: Two times a day (BID) | ORAL | 0 refills | Status: DC

## 2019-05-05 MED FILL — METFORMIN HCL ER 500 MG TB2: 500 | 90 days supply | Qty: 360 | Fill #0

## 2019-05-05 MED FILL — SERTRALINE HCL 50 MG TABS: 50 | 30 days supply | Qty: 30 | Fill #0

## 2019-05-05 MED FILL — SPIRONOLACTONE 50 MG TABLET: 50 | 90 days supply | Qty: 180 | Fill #0

## 2019-05-05 NOTE — Telephone Encounter (Signed)
Last office visit with Dr. Cruzita Lederer was Dec 2019.  One refill of each until patient is seen next week for appointment.

## 2019-05-05 NOTE — Telephone Encounter (Signed)
MEDICATION: Metformin, Spironolactone  PHARMACY:  [facility]  IS THIS A 90 DAY SUPPLY :   IS PATIENT OUT OF MEDICATION:   IF NOT; HOW MUCH IS LEFT:   LAST APPOINTMENT DATE: @12 /03/2019  NEXT APPOINTMENT DATE:@12 /14/2020  DO WE HAVE YOUR PERMISSION TO LEAVE A DETAILED MESSAGE: yes  OTHER COMMENTS:    **Let patient know to contact pharmacy at the end of the day to make sure medication is ready. **  ** Please notify patient to allow 48-72 hours to process**  **Encourage patient to contact the pharmacy for refills or they can request refills through [facility]**

## 2019-05-08 ENCOUNTER — Ambulatory Visit: Payer: 59 | Admitting: Internal Medicine

## 2019-05-08 ENCOUNTER — Other Ambulatory Visit: Payer: Self-pay

## 2019-05-11 ENCOUNTER — Other Ambulatory Visit: Payer: Self-pay

## 2019-05-11 ENCOUNTER — Ambulatory Visit (INDEPENDENT_AMBULATORY_CARE_PROVIDER_SITE_OTHER): Payer: 59 | Admitting: Internal Medicine

## 2019-05-11 ENCOUNTER — Encounter: Payer: Self-pay | Admitting: Internal Medicine

## 2019-05-11 ENCOUNTER — Ambulatory Visit: Payer: 59 | Admitting: Internal Medicine

## 2019-05-11 DIAGNOSIS — E559 Vitamin D deficiency, unspecified: Secondary | ICD-10-CM

## 2019-05-11 DIAGNOSIS — E282 Polycystic ovarian syndrome: Secondary | ICD-10-CM | POA: Diagnosis not present

## 2019-05-11 DIAGNOSIS — E278 Other specified disorders of adrenal gland: Secondary | ICD-10-CM | POA: Diagnosis not present

## 2019-05-11 DIAGNOSIS — R7989 Other specified abnormal findings of blood chemistry: Secondary | ICD-10-CM

## 2019-05-11 MED ORDER — METFORMIN HCL ER 500 MG PO TB24: 1000.0000 mg | ORAL_TABLET | Freq: Two times a day (BID) | ORAL | 3 refills | Status: DC

## 2019-05-11 MED ORDER — SPIRONOLACTONE 50 MG PO TABS: 50.0000 mg | ORAL_TABLET | Freq: Two times a day (BID) | ORAL | 3 refills | Status: DC

## 2019-05-11 NOTE — Patient Instructions (Signed)
Please come back for labs fasting, in a.m., as soon as safe.  Continue Metformin ER 1000 mg twice a day with meals.  Continue spironolactone 50 mg twice a day.  Please come back for a follow-up appointment in 1 year.

## 2019-05-11 NOTE — Progress Notes (Signed)
Patient ID: Jamie Nelson, female   DOB: [birth year], 38 y.o.   MRN: [id number]   Patient location: Home My location: Office Persons participating in the virtual visit: patient, provider  Referring Provider: Midge Minium, MD  I connected with the patient on 05/11/19 at  3:16 PM EST by a video enabled telemedicine application and verified that I am speaking with the correct person.   I discussed the limitations of evaluation and management by telemedicine and the availability of in person appointments. The patient expressed understanding and agreed to proceed.   Details of the encounter are shown below.  HPI: Jamie Nelson is a 38 y.o. female, returning for follow-up for PCOS, increased DHEAS, vitamin D deficiency. Last visit 1 year ago.  At last visit, she returned after a very good period of time in which she changed her diet and continue to exercise consistently.  She saw nutrition and started Optivia-meal replacement.  She was using this off and on.  She lost more than 10 pounds before last visit.  However, since last visit, she is working more and she has more difficult shift during the Covid pandemic.  She started on SSRI at the beginning of the pandemic and she feels much better with this.  However, due to the lack of high to work on her diet and to exercise, she gained 10 pounds back.  Reviewed and addended history: Fertility/Menstrual cycles: -She had regular menses all her life -No history of ovarian cysts - children: 3, one with Down syndrome - miscarriages: 0 - contraception: She was briefly on OCPs in 2010, had tubal ligation in 2017  Acne: -Face - on Spironolactone 50 >> in 03/2016 we increase this to 50 mg twice a day - on Proactive before >> then "Unblemish"  Hirsutism: -Only chin and neck a few hair shafts above upper lip -She plucks it  Weight gain: -Lost 10 to 13 pounds before last visit -No steroid use -No weight loss medicines - Diets tried:  Tried the "ideal protein diet" >> no success; started Optivia pgm.  Since last visit.  She uses this off and on. - Exercise: Running, she ran half- marathons in 2017; also doing [employer].    Treatments tried: -Continues on Metformin ER 1000 mg twice a day started 03/2016.  She lost weight on this and tolerated it well. -We started spironolactone 50 mg twice a day in 03/2016-acne improved. -Did not try [place] -Not on OCPs  Reviewed pertinent labs: Component     Latest Ref Rng & Units 05/03/2018  Hemoglobin A1C     4.6 - 6.5 % 5.1  TSH     0.35 - 4.50 uIU/mL 0.97  DHEA-Sulfate, LCMS     ug/dL 334 (H)    Component     Latest Ref Rng & Units 12/22/2016 04/01/2017  Testosterone     8 - 48 ng/dL 47   Testosterone Free     0.0 - 4.2 pg/mL 3.4   Sex Horm Binding Glob, Serum     24.6 - 122.0 nmol/L 47.9   DHEA-SO4     23 - 266 ug/dL 482 (H)   DHEA-Sulfate, LCMS     ug/dL  302 (H)    Component     Latest Ref Rng & Units 03/30/2016  Testosterone     8 - 48 ng/dL 41  Testosterone Free     0.0 - 4.2 pg/mL 5.2 (H)  Sex Horm Binding Glob, Serum     24.6 -  122.0 nmol/L 56.9  DHEA-SO4     23 - 266 ug/dL 278 (H)  17-OH-Progesterone, LC/MS/MS      ng/dL 137   10/14/2015: - Prolactin 7.64 - HbA1c 5.3% - A.m. cortisol 10.2 - LH 5, FSH 2 - Free testosterone 2.1 (0-4.2) - DHEAS 298.7 (57.3-279.2)  Her thyroid tests were normal: Lab Results  Component Value Date   TSH 0.97 05/03/2018   FREET4 0.80 03/30/2016   + HL. Last set of lipids:    Component Value Date/Time   CHOL 197 05/03/2018 0931   TRIG 52.0 05/03/2018 0931   HDL 77.60 05/03/2018 0931   CHOLHDL 3 05/03/2018 0931   VLDL 10.4 05/03/2018 0931   LDLCALC 109 (H) 05/03/2018 0931   She has a history of mild transaminitis, which resolved at last check: Lab Results  Component Value Date   AST 19 05/03/2018   AST 23 06/04/2017   ALT 18 05/03/2018   ALT 22 06/04/2017   She has no family history of diabetes.    Vit D insufficiency.   At last visit, vitamin D was in the normal range: Lab Results  Component Value Date   VD25OH 31.12 05/03/2018   VD25OH 28.83 (L) 06/04/2017   VD25OH 39.20 12/22/2016   VD25OH 24.38 (L) 03/30/2016   I advised her to continue 2000 units vitamin D daily.  At last visit she was remembering it approximately 5 times a week -and doing the same now.  ROS: Constitutional: + weight gain/no weight loss, no fatigue, no subjective hyperthermia, no subjective hypothermia  Eyes: no blurry vision, no xerophthalmia ENT: no sore throat, no nodules palpated in neck, no dysphagia, no odynophagia, no hoarseness Cardiovascular: no CP/no SOB/no palpitations/no leg swelling Respiratory: no cough/no SOB/no wheezing Gastrointestinal: no N/no V/+ D/no C/no acid reflux Musculoskeletal: no muscle aches/no joint aches Skin: no rashes, no hair loss Neurological: no tremors/no numbness/no tingling/no dizziness  I reviewed pt's medications, allergies, PMH, social hx, family hx, and changes were documented in the history of present illness. Otherwise, unchanged from my initial visit note.  Past Medical History:  Diagnosis Date  . ALLERGIC RHINITIS   . ANXIETY    hx - no meds  . ARTHROSCOPY, KNEE, HX OF   . DEPRESSION    hx - no meds  . Family history of breast cancer   . Family history of colon cancer   . Family history of colonic polyps 07/27/2017  . Family history of lung cancer   . Family history of pancreatic cancer   . HEPATIC CYST   . PCOS (polycystic ovarian syndrome)   . Postpartum care following cesarean delivery 02/26/2012  . S/P cesarean section (10/4) 02/26/2012  . Seasonal allergies   . WISDOM TEETH EXTRACTION, HX OF    Past Surgical History:  Procedure Laterality Date  . Benign liver cyst removed  1988  . CESAREAN SECTION  09/2008  . CESAREAN SECTION  02/26/2012   Procedure: CESAREAN SECTION;  Surgeon: Claiborne Billings A. Pamala Hurry, MD;  Location: [facility];  Service: Obstetrics;   Laterality: N/A;  EDD: 02/27/12  . CESAREAN SECTION WITH BILATERAL TUBAL LIGATION Bilateral 01/30/2014   Procedure: Repeat CESAREAN SECTION WITH TUBAL LIGATION;  Surgeon: Claiborne Billings A. Pamala Hurry, MD;  Location: [facility];  Service: Obstetrics;  Laterality: Bilateral;  EDD: 02/03/14  . KNEE ARTHROSCOPY     left   . SCAR REVISION N/A 01/30/2014   Procedure: SCAR REVISION;  Surgeon: Claiborne Billings A. Pamala Hurry, MD;  Location: [facility];  Service: Obstetrics;  Laterality: N/A;  . wisdom teeth extraction  2004   Social History   Social History  . Marital status: Married    Spouse name: N/A  . Number of children: 3   Occupational History  . RN   Social History Main Topics  . Smoking status: Never Smoker  . Smokeless tobacco: Never Used     Comment: Married, lives with spouse Herbie Baltimore who was Chief Executive Officer of hospitalist service)   . Alcohol use Yes     Comment: socially but none with pregnancy  . Drug use: No   Current Outpatient Medications on File Prior to Visit  Medication Sig Dispense Refill  . ALPRAZolam (XANAX) 0.5 MG tablet Take 1 tablet (0.5 mg total) by mouth 2 (two) times daily as needed for anxiety. 30 tablet 1  . cephALEXin (KEFLEX) 500 MG capsule Take 1 capsule (500 mg total) by mouth 2 (two) times daily. 14 capsule 0  . metFORMIN (GLUCOPHAGE-XR) 500 MG 24 hr tablet Take 2 tablets (1,000 mg total) by mouth 2 (two) times daily. 360 tablet 0  . sertraline (ZOLOFT) 50 MG tablet TAKE 1 TABLET BY MOUTH DAILY. 30 tablet 3  . spironolactone (ALDACTONE) 50 MG tablet Take 1 tablet (50 mg total) by mouth 2 (two) times daily. 180 tablet 0  . Vitamin D, Ergocalciferol, (DRISDOL) 50000 units CAPS capsule Take 1 capsule (50,000 Units total) by mouth every 7 (seven) days. 12 capsule 0   No current facility-administered medications on file prior to visit.   Allergies  Allergen Reactions  . Nitrofurantoin     REACTION: Hives   Family History  Problem Relation Age of Onset  . Colon cancer Father 63        dx at stage IV  . Lung cancer Maternal Grandmother   . Breast cancer Maternal Aunt 60       took HRT  . Stroke Paternal Grandmother   . Diabetes Paternal Grandmother   . Stroke Paternal Grandfather   . Hypertension Mother   . Depression Mother   . Colon polyps Mother        goes every 2 years for c-scope  . Colon cancer Maternal Grandfather 31  . Colon cancer Maternal Uncle 40  . Colon cancer Paternal Uncle        dx late 60's  . Colon cancer Paternal Uncle        dx late 68's  . Colon polyps Paternal Uncle        precancerous  . Pancreatic cancer Other 77   PE: There were no vitals taken for this visit. Wt Readings from Last 3 Encounters:  08/26/18 182 lb (82.6 kg)  05/03/18 177 lb (80.3 kg)  01/08/18 191 lb (86.6 kg)   Constitutional:  in NAD  The physical exam was not performed (virtual visit).  ASSESSMENT: 1. PCOS  2. Elevated DHEAS  3. Weight gain  4. Vitamin D def insufficiency  PLAN: 1.  - Patient with history of mild PCOS based on elevated testosterone levels in the past, right before starting spironolactone.  She had problems losing weight in the past despite running but she did wonderful after she started a weight loss program before last visit.  She was also off and on Optivia meal replacement diet.  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  We will back off the dose to 500 mg twice a day. -She also continues on spironolactone 50 mg twice a day, which she tolerates well, without dizziness and low blood pressure.  She feels that on this, her acne and hirsutism improved.  We will continue this. -Menstrual cycles are regular.  She has a history of tubal ligation.  She  would like to avoid OCPs -When she returns to the clinic we will check a CMP, lipids, HbA1c, TSH, DHEA-S, vitamin D, but not testosterone since this is not reliable in the setting of spironolactone treatment -We will see her back in a year  2. Elevated DHEAS   -This is not unusual in the setting of PCOS -Her levels are fluctuating, slightly higher at last visit.  We discussed that this could be due to exercise. -We will recheck this when she returns to the clinic  3. Weight gain -Excellent improvement before last visit, losing between 10 and 13 pounds, but unfortunately she gained those back during the coronavirus pandemic -After the holidays, she would have time to restart exercising For now, we will need to reduce the Metformin to half maximal dose due to mild diarrhea  4. Vitamin D  insufficiency  -She has a history of vitamin D deficiency, after which we started vitamin D supplementation. -We checked her level at last visit and this was low normal -Taking 2000 units vitamin D 5 to 7 days, as she forgets to take it daily  -We will recheck this when she returns to the clinic  Orders Placed This Encounter  Procedures  . DHEA-Sulfate, Serum  . COMPLETE METABOLIC PANEL WITH GFR  . HgB A1c  . VITAMIN D 25 Hydroxy (Vit-D Deficiency, Fractures)  . Lipid panel  . xtpit - TSH   Philemon Kingdom, MD PhD [facility] Endocrinology

## 2019-06-14 MED FILL — SERTRALINE HCL 50 MG TABLET: 50 | 30 days supply | Qty: 30 | Fill #1

## 2019-06-22 ENCOUNTER — Ambulatory Visit: Payer: 59 | Admitting: Internal Medicine

## 2019-07-20 ENCOUNTER — Ambulatory Visit: Payer: 59 | Admitting: Internal Medicine

## 2019-07-21 MED FILL — SERTRALINE HCL 50 MG TABLET: 50 | 30 days supply | Qty: 30 | Fill #2

## 2019-07-24 ENCOUNTER — Other Ambulatory Visit: Payer: Self-pay | Admitting: Obstetrics & Gynecology

## 2019-07-24 DIAGNOSIS — Z1231 Encounter for screening mammogram for malignant neoplasm of breast: Secondary | ICD-10-CM

## 2019-07-31 ENCOUNTER — Ambulatory Visit
Admission: RE | Admit: 2019-07-31 | Discharge: 2019-07-31 | Disposition: A | Discharge status: Home / Self Care | Payer: 59 | Source: Ambulatory Visit | Attending: Obstetrics & Gynecology | Admitting: Obstetrics & Gynecology

## 2019-07-31 DIAGNOSIS — R222 Localized swelling, mass and lump, trunk: Secondary | ICD-10-CM | POA: Diagnosis not present

## 2019-07-31 DIAGNOSIS — N9089 Other specified noninflammatory disorders of vulva and perineum: Secondary | ICD-10-CM

## 2019-08-02 ENCOUNTER — Other Ambulatory Visit: Payer: Self-pay | Admitting: Surgery

## 2019-08-02 ENCOUNTER — Other Ambulatory Visit (HOSPITAL_COMMUNITY): Payer: Self-pay | Admitting: Surgery

## 2019-08-02 DIAGNOSIS — N9089 Other specified noninflammatory disorders of vulva and perineum: Secondary | ICD-10-CM

## 2019-08-10 ENCOUNTER — Other Ambulatory Visit: Payer: Self-pay

## 2019-08-10 ENCOUNTER — Ambulatory Visit (HOSPITAL_COMMUNITY)
Admission: RE | Admit: 2019-08-10 | Discharge: 2019-08-10 | Disposition: A | Discharge status: Home / Self Care | Payer: 59 | Source: Ambulatory Visit | Attending: Surgery | Admitting: Surgery

## 2019-08-10 DIAGNOSIS — N9089 Other specified noninflammatory disorders of vulva and perineum: Secondary | ICD-10-CM | POA: Diagnosis not present

## 2019-08-10 MED ORDER — GADOBUTROL 1 MMOL/ML IV SOLN
10.0000 mL | Freq: Once | INTRAVENOUS | Status: AC | PRN
  Administered 2019-08-10: 10 mL via INTRAVENOUS

## 2019-08-18 MED FILL — SERTRALINE HCL 50 MG TABLET: 50 | 30 days supply | Qty: 30 | Fill #3

## 2019-08-28 ENCOUNTER — Ambulatory Visit
Admission: RE | Admit: 2019-08-28 | Discharge: 2019-08-28 | Disposition: A | Discharge status: Home / Self Care | Payer: 59 | Source: Ambulatory Visit | Attending: Obstetrics & Gynecology | Admitting: Obstetrics & Gynecology

## 2019-08-28 ENCOUNTER — Other Ambulatory Visit: Payer: Self-pay

## 2019-08-28 DIAGNOSIS — Z1231 Encounter for screening mammogram for malignant neoplasm of breast: Secondary | ICD-10-CM | POA: Diagnosis not present

## 2019-08-29 ENCOUNTER — Ambulatory Visit: Payer: 59 | Admitting: Internal Medicine

## 2019-09-06 ENCOUNTER — Ambulatory Visit: Payer: Self-pay | Admitting: Surgery

## 2019-09-27 ENCOUNTER — Other Ambulatory Visit: Payer: Self-pay | Admitting: Family Medicine

## 2019-09-27 ENCOUNTER — Ambulatory Visit (INDEPENDENT_AMBULATORY_CARE_PROVIDER_SITE_OTHER): Payer: 59 | Admitting: Internal Medicine

## 2019-09-27 ENCOUNTER — Encounter: Payer: Self-pay | Admitting: Internal Medicine

## 2019-09-27 VITALS — BP 126/72 | HR 66 | Temp 98.7°F | Ht 65.0 in | Wt 197.0 lb

## 2019-09-27 DIAGNOSIS — Z1211 Encounter for screening for malignant neoplasm of colon: Secondary | ICD-10-CM | POA: Diagnosis not present

## 2019-09-27 DIAGNOSIS — Z800 Family history of malignant neoplasm of digestive organs: Secondary | ICD-10-CM | POA: Diagnosis not present

## 2019-09-27 MED FILL — SERTRALINE HCL 50 MG TABLET: 50 | 30 days supply | Qty: 30 | Fill #0

## 2019-09-27 NOTE — Patient Instructions (Signed)
Please call back when you are ready to schedule a colonoscopy

## 2019-09-27 NOTE — Progress Notes (Signed)
HISTORY OF PRESENT ILLNESS:  Jamie Nelson is a pleasant 39 y.o. female, ICU nurse at [employer] and daughter-in-law of Dr. Wyline Copas and mother of 3, with polycystic ovarian syndrome who is referred today by her gynecologist regarding colon cancer screening.  Patient has a very strong history of cancer in her family including her father being diagnosed with colon cancer at age 42 and succumbing to the disease at age 23.  She has undergone formal genetic testing with Dr. Ferol Luz in 2019.  Colonoscopy at age 62 recommended.  Her GI review of systems is negative except for occasional diarrhea.  She has completed her Covid vaccination series  REVIEW OF SYSTEMS:  All non-GI ROS negative unless otherwise stated in the HPI except for sinus and allergy, anxiety, perennial cyst scheduled for surgical excision November 01, 2019  Past Medical History:  Diagnosis Date  . ALLERGIC RHINITIS   . ANXIETY    hx - no meds  . ARTHROSCOPY, KNEE, HX OF   . DEPRESSION    hx - no meds  . Family history of breast cancer   . Family history of colon cancer   . Family history of colonic polyps 07/27/2017  . Family history of lung cancer   . Family history of pancreatic cancer   . Hemorrhoids   . HEPATIC CYST   . Migraines   . Obesity   . PCOS (polycystic ovarian syndrome)   . Postpartum care following cesarean delivery 02/26/2012  . S/P cesarean section (10/4) 02/26/2012  . Seasonal allergies   . WISDOM TEETH EXTRACTION, HX OF     Past Surgical History:  Procedure Laterality Date  . Benign liver cyst removed  1988  . CESAREAN SECTION  09/2008  . CESAREAN SECTION  02/26/2012   Procedure: CESAREAN SECTION;  Surgeon: Claiborne Billings A. Pamala Hurry, MD;  Location: [facility];  Service: Obstetrics;  Laterality: N/A;  EDD: 02/27/12  . CESAREAN SECTION WITH BILATERAL TUBAL LIGATION Bilateral 01/30/2014   Procedure: Repeat CESAREAN SECTION WITH TUBAL LIGATION;  Surgeon: Claiborne Billings A. Pamala Hurry, MD;  Location: [facility];  Service:  Obstetrics;  Laterality: Bilateral;  EDD: 02/03/14  . KNEE ARTHROSCOPY     left   . SCAR REVISION N/A 01/30/2014   Procedure: SCAR REVISION;  Surgeon: Claiborne Billings A. Pamala Hurry, MD;  Location: [facility];  Service: Obstetrics;  Laterality: N/A;  . tubal ligation    . wisdom teeth extraction  2004    Social History Jamie Nelson  reports that she has never smoked. She has never used smokeless tobacco. She reports current alcohol use. She reports that she does not use drugs.  family history includes Breast cancer (age of onset: 54) in her maternal aunt; Colon cancer in her paternal uncle and paternal uncle; Colon cancer (age of onset: 6) in her father; Colon cancer (age of onset: 72) in her maternal grandfather and maternal uncle; Colon polyps in her mother and paternal uncle; Depression in her mother; Diabetes in her paternal grandmother; Hypertension in her mother; Lung cancer in her maternal grandmother; Pancreatic cancer (age of onset: 77) in an other family member; Stroke in her paternal grandfather and paternal grandmother.  Allergies  Allergen Reactions  . Nitrofurantoin     REACTION: Hives       PHYSICAL EXAMINATION: Vital signs: BP 126/72   Pulse 66   Temp 98.7 F (37.1 C)   Ht 5\' 5"  (1.651 m)   Wt 197 lb (89.4 kg)   BMI 32.78 kg/m  Constitutional: generally well-appearing, no acute distress Psychiatric: alert and oriented x3, cooperative Eyes: extraocular movements intact, anicteric, conjunctiva pink Mouth: oral pharynx moist, no lesions Neck: supple no lymphadenopathy Cardiovascular: heart regular rate and rhythm, no murmur Lungs: clear to auscultation bilaterally Abdomen: soft, nontender, nondistended, no obvious ascites, no peritoneal signs, normal bowel sounds, no organomegaly Rectal: Deferred to colonoscopy Extremities: no clubbing, cyanosis, or lower extremity edema bilaterally Skin: no lesions on visible extremities Neuro: No focal deficits.  Cranial nerves  intact  ASSESSMENT:   1.  Family history of colon cancer in first-degree relative less than age 30. 2.  History of polycystic ovarian syndrome on Metformin  PLAN:  1.  Schedule colonoscopy. 2.  Hold metforminThe nature of the procedure, as well as the risks, benefits, and alternatives were carefully and thoroughly reviewed with the patient. Ample time for discussion and questions allowed. The patient understood, was satisfied, and agreed to proceed.

## 2019-10-18 DIAGNOSIS — H21233 Degeneration of iris (pigmentary), bilateral: Secondary | ICD-10-CM | POA: Diagnosis not present

## 2019-10-18 DIAGNOSIS — H40023 Open angle with borderline findings, high risk, bilateral: Secondary | ICD-10-CM | POA: Diagnosis not present

## 2019-10-18 DIAGNOSIS — H40053 Ocular hypertension, bilateral: Secondary | ICD-10-CM | POA: Diagnosis not present

## 2019-10-18 DIAGNOSIS — H5213 Myopia, bilateral: Secondary | ICD-10-CM | POA: Diagnosis not present

## 2019-10-18 DIAGNOSIS — H52221 Regular astigmatism, right eye: Secondary | ICD-10-CM | POA: Diagnosis not present

## 2019-10-27 DIAGNOSIS — H21233 Degeneration of iris (pigmentary), bilateral: Secondary | ICD-10-CM | POA: Diagnosis not present

## 2019-10-28 ENCOUNTER — Other Ambulatory Visit (HOSPITAL_COMMUNITY): Payer: 59

## 2019-11-01 ENCOUNTER — Encounter (HOSPITAL_BASED_OUTPATIENT_CLINIC_OR_DEPARTMENT_OTHER): Payer: Self-pay

## 2019-11-01 ENCOUNTER — Ambulatory Visit (HOSPITAL_BASED_OUTPATIENT_CLINIC_OR_DEPARTMENT_OTHER): Admit: 2019-11-01 | Payer: 59 | Admitting: Surgery

## 2019-11-01 SURGERY — EXCISION, KELOID
Anesthesia: Monitor Anesthesia Care

## 2019-11-10 DIAGNOSIS — H21233 Degeneration of iris (pigmentary), bilateral: Secondary | ICD-10-CM | POA: Diagnosis not present

## 2019-11-13 ENCOUNTER — Other Ambulatory Visit: Payer: Self-pay | Admitting: Internal Medicine

## 2019-11-13 ENCOUNTER — Encounter: Payer: Self-pay | Admitting: Internal Medicine

## 2019-11-13 MED FILL — METFORMIN HCL ER 500 MG TB2: 500 | 90 days supply | Qty: 360 | Fill #0

## 2019-11-13 NOTE — Telephone Encounter (Signed)
Please review and advise.    Thank you.

## 2019-11-15 ENCOUNTER — Encounter: Payer: Self-pay | Admitting: Family Medicine

## 2019-11-15 NOTE — Telephone Encounter (Signed)
Can this visit be a virtual visit or does it have to be in office?

## 2019-11-23 ENCOUNTER — Encounter: Payer: Self-pay | Admitting: Family Medicine

## 2019-11-23 ENCOUNTER — Other Ambulatory Visit: Payer: Self-pay

## 2019-11-23 ENCOUNTER — Telehealth (INDEPENDENT_AMBULATORY_CARE_PROVIDER_SITE_OTHER): Payer: 59 | Admitting: Family Medicine

## 2019-11-23 VITALS — Ht 65.0 in | Wt 193.0 lb

## 2019-11-23 DIAGNOSIS — E669 Obesity, unspecified: Secondary | ICD-10-CM

## 2019-11-23 MED ORDER — SAXENDA 18 MG/3ML ~~LOC~~ SOPN: PEN_INJECTOR | SUBCUTANEOUS | 0 refills | Status: DC

## 2019-11-23 NOTE — Progress Notes (Signed)
Virtual Visit via Video   I connected with Jamie Nelson on 11/23/19 at  2:00 PM EDT by a video enabled telemedicine application and verified that I am speaking with the correct person using two identifiers.  Location Jamie Nelson: Home Location provider: Acupuncturist, Office Persons participating in the virtual visit: Jamie Nelson, Provider, [place] (Jess B)  I discussed the limitations of evaluation and management by telemedicine and the availability of in person appointments. The Jamie Nelson expressed understanding and agreed to proceed.  Subjective:   HPI:   Obesity- pt has PCOS which despite healthy diet and regular exercise makes losing weight more difficult.  Pt is interested in the possibility of Saxenda.  She is already on Metformin and Spironolactone per Endo.  Pt's BMI is 32.12 which qualifies for Saxenda use.  ROS:   See pertinent positives and negatives per HPI.  Jamie Nelson Active Problem List   Diagnosis Date Noted  . Cough 01/08/2018  . Nutritional counseling 12/30/2017  . Genetic testing 08/26/2017  . Family history of colonic polyps 07/27/2017  . Family history of colon cancer   . Family history of pancreatic cancer   . Family history of breast cancer   . Family history of lung cancer   . Obesity (BMI 30.0-34.9) 06/04/2017  . Family history of colon cancer in father 06/04/2017  . Elevated DHEA ([place]) 12/22/2016  . Vitamin D insufficiency 12/22/2016  . UTI (urinary tract infection) 07/08/2016  . PCOS (polycystic ovarian syndrome) 03/30/2016  . Routine general medical examination at a health care facility 07/11/2014  . Anxiety state 11/08/2009  . Depression 11/08/2009  . ALLERGIC RHINITIS 03/27/2009  . HEPATIC CYST 03/27/2009    Social History   Tobacco Use  . Smoking status: Never Smoker  . Smokeless tobacco: Never Used  . Tobacco comment: Married, lives with spouse Herbie Baltimore who is Chief Executive Officer of hospitalist service) and son Barnabas Lister who has down symdrome  Substance  Use Topics  . Alcohol use: Yes    Comment: socially but none with pregnancy    Current Outpatient Medications:  .  metFORMIN (GLUCOPHAGE-XR) 500 MG 24 hr tablet, TAKE 2 TABLETS (1,000 MG TOTAL) BY MOUTH 2 (TWO) TIMES DAILY., Disp: 360 tablet, Rfl: 1 .  sertraline (ZOLOFT) 50 MG tablet, TAKE 1 TABLET BY MOUTH DAILY., Disp: 30 tablet, Rfl: 3 .  spironolactone (ALDACTONE) 50 MG tablet, Take 1 tablet (50 mg total) by mouth 2 (two) times daily. Keep on file until pt. Needs it, Disp: 180 tablet, Rfl: 3 .  ALPRAZolam (XANAX) 0.5 MG tablet, Take 1 tablet (0.5 mg total) by mouth 2 (two) times daily as needed for anxiety. (Jamie Nelson not taking: Reported on 11/23/2019), Disp: 30 tablet, Rfl: 1  Allergies  Allergen Reactions  . Nitrofurantoin     REACTION: Hives    Objective:   Ht 5\' 5"  (1.651 m)   Wt 193 lb (87.5 kg)   Breastfeeding No   BMI 32.12 kg/m   AAOx3, NAD NCAT, EOMI No obvious CN deficits Coloring WNL Pt is able to speak clearly, coherently without shortness of breath or increased work of breathing.  Thought process is linear.  Mood is appropriate.   Assessment and Plan:   Obesity- pt is frustrated by her lack of weight loss progress despite healthy diet and regular exercise.  She knows her PCOS makes weight loss more difficult.  She is interested in trying Saxenda to help w/ weight loss which she continues healthy diet and exercise.  She is aware that this is  a daily injection and that we need to titrate this over time to minimize GI side effects.  Did discuss black box warning about thyroid tumors.  Pt is looking forward to starting and is hopeful this will help.   Annye Asa, MD 11/23/2019

## 2019-11-23 NOTE — Progress Notes (Signed)
I have discussed the procedure for the virtual visit with the patient who has given consent to proceed with assessment and treatment.   Jamie Nelson L Jisell Majer, CMA     

## 2019-12-04 ENCOUNTER — Ambulatory Visit: Payer: 59 | Admitting: Family Medicine

## 2019-12-04 MED FILL — SPIRONOLACTONE 50 MG TABS: 50 | 90 days supply | Qty: 180 | Fill #0

## 2019-12-04 MED FILL — SERTRALINE HCL 50 MG TABLET: 50 | 30 days supply | Qty: 30 | Fill #2

## 2019-12-18 ENCOUNTER — Encounter: Payer: Self-pay | Admitting: Family Medicine

## 2019-12-18 DIAGNOSIS — H40053 Ocular hypertension, bilateral: Secondary | ICD-10-CM | POA: Diagnosis not present

## 2019-12-18 DIAGNOSIS — H5213 Myopia, bilateral: Secondary | ICD-10-CM | POA: Diagnosis not present

## 2019-12-18 DIAGNOSIS — H40023 Open angle with borderline findings, high risk, bilateral: Secondary | ICD-10-CM | POA: Diagnosis not present

## 2019-12-18 DIAGNOSIS — F419 Anxiety disorder, unspecified: Secondary | ICD-10-CM

## 2019-12-18 DIAGNOSIS — H18893 Other specified disorders of cornea, bilateral: Secondary | ICD-10-CM | POA: Diagnosis not present

## 2019-12-18 DIAGNOSIS — H21233 Degeneration of iris (pigmentary), bilateral: Secondary | ICD-10-CM | POA: Diagnosis not present

## 2019-12-18 DIAGNOSIS — H52221 Regular astigmatism, right eye: Secondary | ICD-10-CM | POA: Diagnosis not present

## 2019-12-25 ENCOUNTER — Other Ambulatory Visit: Payer: Self-pay | Admitting: Family Medicine

## 2019-12-25 MED FILL — SAXENDA 18 MG/3 ML PEN: 18 | 30 days supply | Qty: 15 | Fill #0

## 2019-12-26 ENCOUNTER — Other Ambulatory Visit: Payer: Self-pay | Admitting: General Practice

## 2019-12-26 MED ORDER — ADVOCATE INSULIN PEN NEEDLES 33G X 4 MM MISC: 2 refills | Status: DC

## 2019-12-26 MED FILL — UNIFINE PENTIPS 32GX5/32: 32G X 4 MM | 90 days supply | Qty: 100 | Fill #0

## 2020-01-11 MED FILL — SERTRALINE HCL 50 MG TABLET: 50 | 30 days supply | Qty: 30 | Fill #3

## 2020-01-19 ENCOUNTER — Encounter: Payer: Self-pay | Admitting: Family Medicine

## 2020-01-25 ENCOUNTER — Ambulatory Visit (INDEPENDENT_AMBULATORY_CARE_PROVIDER_SITE_OTHER): Payer: 59 | Admitting: Psychology

## 2020-01-25 DIAGNOSIS — F4323 Adjustment disorder with mixed anxiety and depressed mood: Secondary | ICD-10-CM | POA: Diagnosis not present

## 2020-01-31 ENCOUNTER — Ambulatory Visit (INDEPENDENT_AMBULATORY_CARE_PROVIDER_SITE_OTHER): Payer: 59 | Admitting: Psychology

## 2020-01-31 DIAGNOSIS — F4323 Adjustment disorder with mixed anxiety and depressed mood: Secondary | ICD-10-CM

## 2020-02-07 ENCOUNTER — Ambulatory Visit: Payer: 59 | Admitting: Psychology

## 2020-02-14 ENCOUNTER — Ambulatory Visit: Payer: 59 | Admitting: Psychology

## 2020-02-16 ENCOUNTER — Other Ambulatory Visit: Payer: Self-pay | Admitting: Family Medicine

## 2020-02-16 ENCOUNTER — Ambulatory Visit (INDEPENDENT_AMBULATORY_CARE_PROVIDER_SITE_OTHER): Payer: 59 | Admitting: Psychology

## 2020-02-16 DIAGNOSIS — F4323 Adjustment disorder with mixed anxiety and depressed mood: Secondary | ICD-10-CM

## 2020-02-16 MED FILL — SERTRALINE HCL 50 MG TABLET: 50 | 30 days supply | Qty: 30 | Fill #0

## 2020-02-16 NOTE — Telephone Encounter (Signed)
Saxenda LFD 12/25/19 #72mL with no refills LOV 11/23/19 NOV none

## 2020-02-19 MED FILL — SAXENDA 18 MG/3 ML PEN: 18 | 30 days supply | Qty: 15 | Fill #0

## 2020-03-08 ENCOUNTER — Ambulatory Visit: Payer: 59 | Admitting: Psychology

## 2020-03-15 ENCOUNTER — Other Ambulatory Visit: Payer: Self-pay | Admitting: Family Medicine

## 2020-03-15 ENCOUNTER — Telehealth: Payer: 59 | Admitting: Emergency Medicine

## 2020-03-15 ENCOUNTER — Encounter: Payer: Self-pay | Admitting: Family Medicine

## 2020-03-15 DIAGNOSIS — R300 Dysuria: Secondary | ICD-10-CM

## 2020-03-15 MED ORDER — CEPHALEXIN 500 MG PO CAPS: 500.0000 mg | ORAL_CAPSULE | Freq: Two times a day (BID) | ORAL | 0 refills | Status: DC

## 2020-03-15 MED ORDER — SULFAMETHOXAZOLE-TRIMETHOPRIM 800-160 MG PO TABS: 1.0000 | ORAL_TABLET | Freq: Two times a day (BID) | ORAL | 0 refills | Status: DC

## 2020-03-15 MED FILL — SULFAMETHOXAZOLE-TMP DS TAB: 800-160 | 5 days supply | Qty: 10 | Fill #0

## 2020-03-15 MED FILL — CEPHALEXIN 500 MG CAPSULE: 500 | 5 days supply | Qty: 10 | Fill #0

## 2020-03-15 NOTE — Progress Notes (Signed)
We are sorry that you are not feeling well.  Here is how we plan to help!  Based on what you shared with me it looks like you most likely have a simple urinary tract infection.  A UTI (Urinary Tract Infection) is a bacterial infection of the bladder.  Most cases of urinary tract infections are simple to treat but a key part of your care is to encourage you to drink plenty of fluids and watch your symptoms carefully.  I have prescribed Bactrim DS One tablet twice a day for 5 days.  Your symptoms should gradually improve. Call us if the burning in your urine worsens, you develop worsening fever, back pain or pelvic pain or if your symptoms do not resolve after completing the antibiotic.  Urinary tract infections can be prevented by drinking plenty of water to keep your body hydrated.  Also be sure when you wipe, wipe from front to back and don't hold it in!  If possible, empty your bladder every 4 hours.  Your e-visit answers were reviewed by a board certified advanced clinical practitioner to complete your personal care plan.  Depending on the condition, your plan could have included both over the counter or prescription medications.  If there is a problem please reply  once you have received a response from your provider.  Your safety is important to [place].  If you have drug allergies check your prescription carefully.    You can use MyChart to ask questions about todays visit, request a non-urgent call back, or ask for a work or school excuse for 24 hours related to this e-Visit. If it has been greater than 24 hours you will need to follow up with your provider, or enter a new e-Visit to address those concerns.   You will get an e-mail in the next two days asking about your experience.  I hope that your e-visit has been valuable and will speed your recovery. Thank you for using e-visits.   **Please do not respond to this message unless you have follow up questions.** Greater than 5 but less  than 10 minutes spent researching, coordinating, and implementing care for this patient today

## 2020-03-20 MED FILL — SERTRALINE HCL 50 MG TABLET: 50 | 30 days supply | Qty: 30 | Fill #1

## 2020-03-26 DIAGNOSIS — H52223 Regular astigmatism, bilateral: Secondary | ICD-10-CM | POA: Diagnosis not present

## 2020-03-26 DIAGNOSIS — H5213 Myopia, bilateral: Secondary | ICD-10-CM | POA: Diagnosis not present

## 2020-03-31 ENCOUNTER — Other Ambulatory Visit (HOSPITAL_COMMUNITY): Payer: Self-pay

## 2020-03-31 DIAGNOSIS — S01511A Laceration without foreign body of lip, initial encounter: Secondary | ICD-10-CM | POA: Diagnosis not present

## 2020-03-31 DIAGNOSIS — S098XXA Other specified injuries of head, initial encounter: Secondary | ICD-10-CM | POA: Diagnosis not present

## 2020-03-31 DIAGNOSIS — F10129 Alcohol abuse with intoxication, unspecified: Secondary | ICD-10-CM | POA: Diagnosis not present

## 2020-03-31 DIAGNOSIS — S0990XA Unspecified injury of head, initial encounter: Secondary | ICD-10-CM | POA: Diagnosis not present

## 2020-03-31 DIAGNOSIS — F10120 Alcohol abuse with intoxication, uncomplicated: Secondary | ICD-10-CM | POA: Diagnosis not present

## 2020-04-02 MED FILL — CHLORHEXIDINE 0.12% RINSE: 0.12 | 16 days supply | Qty: 473 | Fill #0

## 2020-04-09 ENCOUNTER — Other Ambulatory Visit: Payer: Self-pay | Admitting: Family Medicine

## 2020-04-09 MED FILL — SAXENDA 18 MG/3 ML PEN: 18 | 30 days supply | Qty: 15 | Fill #0

## 2020-04-23 MED FILL — SERTRALINE HCL 50 MG TABLET: 50 | 30 days supply | Qty: 30 | Fill #2

## 2020-05-07 ENCOUNTER — Telehealth: Payer: Self-pay

## 2020-05-07 ENCOUNTER — Other Ambulatory Visit: Payer: Self-pay

## 2020-05-07 DIAGNOSIS — E669 Obesity, unspecified: Secondary | ICD-10-CM

## 2020-05-07 MED ORDER — SAXENDA 18 MG/3ML ~~LOC~~ SOPN: PEN_INJECTOR | SUBCUTANEOUS | 0 refills | Status: DC

## 2020-05-07 MED FILL — SAXENDA 18 MG/3 ML PEN: 18 | 32 days supply | Qty: 15 | Fill #0

## 2020-05-07 MED FILL — UNIFINE PENTIPS 32GX5/32: 32G X 4 MM | 90 days supply | Qty: 100 | Fill #1

## 2020-05-07 NOTE — Telephone Encounter (Signed)
Spoke with patient in regards to her [place]. A PA was not needed for Rx. Pharmacy notifed as well. Patient understood. No further concerns at this time.

## 2020-05-28 ENCOUNTER — Other Ambulatory Visit (HOSPITAL_COMMUNITY): Payer: Self-pay | Admitting: Obstetrics & Gynecology

## 2020-05-28 MED FILL — UNIFINE PENTIPS 32GX5/32: 32G X 4 MM | 90 days supply | Qty: 100 | Fill #1

## 2020-05-28 MED FILL — MEDROXYPROGESTERONE 5 MG TA: 5 | 7 days supply | Qty: 7 | Fill #0

## 2020-06-03 ENCOUNTER — Other Ambulatory Visit: Payer: Self-pay | Admitting: Internal Medicine

## 2020-06-03 MED FILL — METFORMIN HCL ER 500 MG TB2: 500 | 90 days supply | Qty: 360 | Fill #1

## 2020-06-03 MED FILL — SERTRALINE HCL 50 MG TABLET: 50 | 30 days supply | Qty: 30 | Fill #3

## 2020-06-03 MED FILL — SPIRONOLACTONE 50 MG TABS: 50 | 90 days supply | Qty: 180 | Fill #0

## 2020-07-10 ENCOUNTER — Other Ambulatory Visit: Payer: Self-pay | Admitting: Family Medicine

## 2020-07-11 ENCOUNTER — Telehealth: Payer: 59 | Admitting: Emergency Medicine

## 2020-07-11 ENCOUNTER — Other Ambulatory Visit: Payer: Self-pay | Admitting: Emergency Medicine

## 2020-07-11 DIAGNOSIS — R350 Frequency of micturition: Secondary | ICD-10-CM

## 2020-07-11 DIAGNOSIS — R300 Dysuria: Secondary | ICD-10-CM

## 2020-07-11 MED ORDER — CEPHALEXIN 500 MG PO CAPS
500.0000 mg | ORAL_CAPSULE | Freq: Two times a day (BID) | ORAL | 0 refills | Status: DC
Start: 2020-07-11 — End: 2020-07-11

## 2020-07-11 MED FILL — CEPHALEXIN 500 MG CAPSULE: 500 | 7 days supply | Qty: 14 | Fill #0

## 2020-07-11 MED FILL — SERTRALINE HCL 50 MG TABLET: 50 | 30 days supply | Qty: 30 | Fill #0

## 2020-07-11 NOTE — Progress Notes (Signed)

## 2020-08-14 MED FILL — SERTRALINE HCL 50 MG TABLET: 50 | 30 days supply | Qty: 30 | Fill #1

## 2020-09-13 ENCOUNTER — Telehealth: Payer: 59 | Admitting: Physician Assistant

## 2020-09-13 DIAGNOSIS — R058 Other specified cough: Secondary | ICD-10-CM | POA: Diagnosis not present

## 2020-09-13 MED ORDER — BENZONATATE 100 MG PO CAPS: 100.0000 mg | ORAL_CAPSULE | Freq: Three times a day (TID) | ORAL | 0 refills | Status: DC | PRN

## 2020-09-13 MED ORDER — PREDNISONE 10 MG (21) PO TBPK: ORAL_TABLET | ORAL | 0 refills | Status: DC

## 2020-09-13 NOTE — Progress Notes (Signed)
We are sorry that you are not feeling well.  Here is how we plan to help!  Based on your presentation I believe you most likely have A cough due to a virus.  This is called viral bronchitis and is best treated by rest, plenty of fluids and control of the cough.  You may use Ibuprofen or Tylenol as directed to help your symptoms.     In addition you may use A prescription cough medication called Tessalon Perles 100mg. You may take 1-2 capsules every 8 hours as needed for your cough.  Prednisone 10 mg daily for 6 days (see taper instructions below)  Directions for 6 day taper: Day 1: 2 tablets before breakfast, 1 after both lunch & dinner and 2 at bedtime Day 2: 1 tab before breakfast, 1 after both lunch & dinner and 2 at bedtime Day 3: 1 tab at each meal & 1 at bedtime Day 4: 1 tab at breakfast, 1 at lunch, 1 at bedtime Day 5: 1 tab at breakfast & 1 tab at bedtime Day 6: 1 tab at breakfast   From your responses in the eVisit questionnaire you describe inflammation in the upper respiratory tract which is causing a significant cough.  This is commonly called Bronchitis and has four common causes:    Allergies  Viral Infections  Acid Reflux  Bacterial Infection Allergies, viruses and acid reflux are treated by controlling symptoms or eliminating the cause. An example might be a cough caused by taking certain blood pressure medications. You stop the cough by changing the medication. Another example might be a cough caused by acid reflux. Controlling the reflux helps control the cough.  USE OF BRONCHODILATOR ("RESCUE") INHALERS: There is a risk from using your bronchodilator too frequently.  The risk is that over-reliance on a medication which only relaxes the muscles surrounding the breathing tubes can reduce the effectiveness of medications prescribed to reduce swelling and congestion of the tubes themselves.  Although you feel brief relief from the bronchodilator inhaler, your asthma may  actually be worsening with the tubes becoming more swollen and filled with mucus.  This can delay other crucial treatments, such as oral steroid medications. If you need to use a bronchodilator inhaler daily, several times per day, you should discuss this with your provider.  There are probably better treatments that could be used to keep your asthma under control.     HOME CARE . Only take medications as instructed by your medical team. . Complete the entire course of an antibiotic. . Drink plenty of fluids and get plenty of rest. . Avoid close contacts especially the very young and the elderly . Cover your mouth if you cough or cough into your sleeve. . Always remember to wash your hands . A steam or ultrasonic humidifier can help congestion.   GET HELP RIGHT AWAY IF: . You develop worsening fever. . You become short of breath . You cough up blood. . Your symptoms persist after you have completed your treatment plan MAKE SURE YOU   Understand these instructions.  Will watch your condition.  Will get help right away if you are not doing well or get worse.  Your e-visit answers were reviewed by a board certified advanced clinical practitioner to complete your personal care plan.  Depending on the condition, your plan could have included both over the counter or prescription medications. If there is a problem please reply  once you have received a response from your provider. Your   safety is important to us.  If you have drug allergies check your prescription carefully.    You can use MyChart to ask questions about today's visit, request a non-urgent call back, or ask for a work or school excuse for 24 hours related to this e-Visit. If it has been greater than 24 hours you will need to follow up with your provider, or enter a new e-Visit to address those concerns. You will get an e-mail in the next two days asking about your experience.  I hope that your e-visit has been valuable and will  speed your recovery. Thank you for using e-visits.  I provided 6 minutes of non face-to-face time during this encounter for chart review and documentation.   

## 2020-09-20 ENCOUNTER — Other Ambulatory Visit: Payer: Self-pay | Admitting: Obstetrics & Gynecology

## 2020-09-20 DIAGNOSIS — Z1231 Encounter for screening mammogram for malignant neoplasm of breast: Secondary | ICD-10-CM

## 2020-09-22 DIAGNOSIS — Z8616 Personal history of COVID-19: Secondary | ICD-10-CM

## 2020-09-22 HISTORY — DX: Personal history of COVID-19: Z86.16

## 2020-09-24 ENCOUNTER — Other Ambulatory Visit (HOSPITAL_COMMUNITY): Payer: Self-pay

## 2020-09-24 MED FILL — Sertraline HCl Tab 50 MG: ORAL | 30 days supply | Qty: 30 | Fill #0 | Status: AC

## 2020-10-25 ENCOUNTER — Other Ambulatory Visit (HOSPITAL_COMMUNITY): Payer: Self-pay

## 2020-10-25 MED FILL — Sertraline HCl Tab 50 MG: ORAL | 30 days supply | Qty: 30 | Fill #1 | Status: CN

## 2020-10-25 MED FILL — Sertraline HCl Tab 50 MG: ORAL | 30 days supply | Qty: 30 | Fill #1 | Status: AC

## 2020-10-28 ENCOUNTER — Other Ambulatory Visit (HOSPITAL_COMMUNITY): Payer: Self-pay

## 2020-10-28 MED ORDER — CARESTART COVID-19 HOME TEST VI KIT
PACK | 0 refills | Status: DC
  Filled 2020-10-28: qty 4, 4d supply, fill #0

## 2020-11-02 ENCOUNTER — Telehealth: Payer: 59 | Admitting: Orthopedic Surgery

## 2020-11-02 DIAGNOSIS — R399 Unspecified symptoms and signs involving the genitourinary system: Secondary | ICD-10-CM | POA: Diagnosis not present

## 2020-11-02 MED ORDER — CEPHALEXIN 500 MG PO CAPS: 500.0000 mg | ORAL_CAPSULE | Freq: Four times a day (QID) | ORAL | 0 refills | Status: AC

## 2020-11-02 NOTE — Progress Notes (Signed)
We are sorry that you are not feeling well.  Here is how we plan to help!  Based on what you shared with me it looks like you most likely have a simple urinary tract infection.  A UTI (Urinary Tract Infection) is a bacterial infection of the bladder.  Most cases of urinary tract infections are simple to treat but a key part of your care is to encourage you to drink plenty of fluids and watch your symptoms carefully.  I have prescribed Keflex 500 mg twice a day for 7 days.  Your symptoms should gradually improve. Call us if the burning in your urine worsens, you develop worsening fever, back pain or pelvic pain or if your symptoms do not resolve after completing the antibiotic.  Urinary tract infections can be prevented by drinking plenty of water to keep your body hydrated.  Also be sure when you wipe, wipe from front to back and don't hold it in!  If possible, empty your bladder every 4 hours.  Your e-visit answers were reviewed by a board certified advanced clinical practitioner to complete your personal care plan.  Depending on the condition, your plan could have included both over the counter or prescription medications.  If there is a problem please reply  once you have received a response from your provider.  Your safety is important to [place].  If you have drug allergies check your prescription carefully.    You can use MyChart to ask questions about today's visit, request a non-urgent call back, or ask for a work or school excuse for 24 hours related to this e-Visit. If it has been greater than 24 hours you will need to follow up with your provider, or enter a new e-Visit to address those concerns.   You will get an e-mail in the next two days asking about your experience.  I hope that your e-visit has been valuable and will speed your recovery. Thank you for using e-visits.   Greater than 5 minutes, yet less than 10 minutes of time have been spent researching, coordinating and  implementing care for this patient today.

## 2020-11-12 ENCOUNTER — Other Ambulatory Visit: Payer: Self-pay | Admitting: Obstetrics & Gynecology

## 2020-11-12 ENCOUNTER — Ambulatory Visit
Admission: RE | Admit: 2020-11-12 | Discharge: 2020-11-12 | Disposition: A | Discharge status: Home / Self Care | Payer: 59 | Source: Ambulatory Visit | Attending: Obstetrics & Gynecology | Admitting: Obstetrics & Gynecology

## 2020-11-12 ENCOUNTER — Other Ambulatory Visit: Payer: Self-pay

## 2020-11-12 DIAGNOSIS — Z1231 Encounter for screening mammogram for malignant neoplasm of breast: Secondary | ICD-10-CM

## 2020-11-12 DIAGNOSIS — N644 Mastodynia: Secondary | ICD-10-CM

## 2020-11-18 ENCOUNTER — Other Ambulatory Visit (HOSPITAL_COMMUNITY): Payer: Self-pay

## 2020-11-18 ENCOUNTER — Encounter: Payer: Self-pay | Admitting: Family Medicine

## 2020-11-18 MED ORDER — CEPHALEXIN 500 MG PO CAPS
500.0000 mg | ORAL_CAPSULE | Freq: Two times a day (BID) | ORAL | 0 refills | Status: AC
  Filled 2020-11-18: qty 10, 5d supply, fill #0

## 2020-11-20 ENCOUNTER — Encounter: Payer: Self-pay | Admitting: *Deleted

## 2020-11-21 ENCOUNTER — Encounter: Payer: Self-pay | Admitting: Family Medicine

## 2020-11-21 ENCOUNTER — Other Ambulatory Visit: Payer: Self-pay

## 2020-11-21 ENCOUNTER — Ambulatory Visit (INDEPENDENT_AMBULATORY_CARE_PROVIDER_SITE_OTHER): Payer: 59 | Admitting: Family Medicine

## 2020-11-21 ENCOUNTER — Other Ambulatory Visit (HOSPITAL_COMMUNITY): Payer: Self-pay

## 2020-11-21 VITALS — BP 130/80 | HR 62 | Temp 97.8°F | Resp 20 | Ht 65.0 in | Wt 216.2 lb

## 2020-11-21 DIAGNOSIS — E559 Vitamin D deficiency, unspecified: Secondary | ICD-10-CM | POA: Diagnosis not present

## 2020-11-21 DIAGNOSIS — E669 Obesity, unspecified: Secondary | ICD-10-CM | POA: Diagnosis not present

## 2020-11-21 DIAGNOSIS — H21233 Degeneration of iris (pigmentary), bilateral: Secondary | ICD-10-CM | POA: Insufficient documentation

## 2020-11-21 DIAGNOSIS — Z0000 Encounter for general adult medical examination without abnormal findings: Secondary | ICD-10-CM

## 2020-11-21 LAB — HEPATIC FUNCTION PANEL
ALT: 41 U/L — ABNORMAL HIGH (ref 0–35)
AST: 30 U/L (ref 0–37)
Albumin: 4.2 g/dL (ref 3.5–5.2)
Alkaline Phosphatase: 88 U/L (ref 39–117)
Bilirubin, Direct: 0.1 mg/dL (ref 0.0–0.3)
Total Bilirubin: 0.6 mg/dL (ref 0.2–1.2)
Total Protein: 6.8 g/dL (ref 6.0–8.3)

## 2020-11-21 LAB — BASIC METABOLIC PANEL
BUN: 13 mg/dL (ref 6–23)
CO2: 23 mEq/L (ref 19–32)
Calcium: 9.5 mg/dL (ref 8.4–10.5)
Chloride: 104 mEq/L (ref 96–112)
Creatinine, Ser: 0.91 mg/dL (ref 0.40–1.20)
GFR: 78.48 mL/min (ref >60.00)
Glucose, Bld: 81 mg/dL (ref 70–99)
Potassium: 3.9 mEq/L (ref 3.5–5.1)
Sodium: 138 mEq/L (ref 135–145)

## 2020-11-21 LAB — LIPID PANEL
Cholesterol: 218 mg/dL — ABNORMAL HIGH (ref 0–200)
HDL: 52.8 mg/dL (ref >39.00)
LDL Cholesterol: 129 mg/dL — ABNORMAL HIGH (ref 0–99)
NonHDL: 164.92
Total CHOL/HDL Ratio: 4
Triglycerides: 178 mg/dL — ABNORMAL HIGH (ref 0.0–149.0)
VLDL: 35.6 mg/dL (ref 0.0–40.0)

## 2020-11-21 LAB — CBC WITH DIFFERENTIAL/PLATELET
Basophils Absolute: 0 10*3/uL (ref 0.0–0.1)
Basophils Relative: 0.6 % (ref 0.0–3.0)
Eosinophils Absolute: 0.1 10*3/uL (ref 0.0–0.7)
Eosinophils Relative: 1.1 % (ref 0.0–5.0)
HCT: 38.6 % (ref 36.0–46.0)
Hemoglobin: 12.8 g/dL (ref 12.0–15.0)
Lymphocytes Relative: 28.1 % (ref 12.0–46.0)
Lymphs Abs: 2 10*3/uL (ref 0.7–4.0)
MCHC: 33.2 g/dL (ref 30.0–36.0)
MCV: 90.7 fl (ref 78.0–100.0)
Monocytes Absolute: 0.7 10*3/uL (ref 0.1–1.0)
Monocytes Relative: 9.1 % (ref 3.0–12.0)
Neutro Abs: 4.4 10*3/uL (ref 1.4–7.7)
Neutrophils Relative %: 61.1 % (ref 43.0–77.0)
Platelets: 225 10*3/uL (ref 150.0–400.0)
RBC: 4.26 Mil/uL (ref 3.87–5.11)
RDW: 13.4 % (ref 11.5–15.5)
WBC: 7.1 10*3/uL (ref 4.0–10.5)

## 2020-11-21 LAB — VITAMIN D 25 HYDROXY (VIT D DEFICIENCY, FRACTURES): VITD: 24.6 ng/mL — ABNORMAL LOW (ref 30.00–100.00)

## 2020-11-21 LAB — TSH: TSH: 0.75 u[IU]/mL (ref 0.35–5.50)

## 2020-11-21 MED ORDER — SERTRALINE HCL 100 MG PO TABS
100.0000 mg | ORAL_TABLET | Freq: Every day | ORAL | 1 refills | Status: DC
Start: 2020-11-21 — End: 2021-07-23
  Filled 2020-11-21: qty 90, 90d supply, fill #0
  Filled 2021-03-17: qty 90, 90d supply, fill #1

## 2020-11-21 NOTE — Assessment & Plan Note (Signed)
Check labs and replete prn. 

## 2020-11-21 NOTE — Patient Instructions (Addendum)
Follow up in 6 months to recheck weight loss progress We'll notify you of your lab results and make any changes if needed Continue to work on healthy diet and regular exercise- you can do it! Ask GYN about vaginal atrophy and UTIs Schedule your colonoscopy Call with any questions or concerns Stay Safe!  Stay Healthy! Have a great summer!

## 2020-11-21 NOTE — Progress Notes (Signed)
   Subjective:    Patient ID: Jamie Nelson, female    DOB: 1980/11/04, 40 y.o.   MRN: [id number]  HPI CPE- pap and mammo are both scheduled.  UTD on Tdap, COVID.  Reviewed past medical, surgical, family and social histories.   Patient Care Team    Relationship Specialty Notifications Start End  Midge Minium, MD PCP - General Family Medicine  06/04/17   Artelia Laroche, CNM Midwife   06/29/11   Philemon Kingdom, MD Consulting Physician Internal Medicine  06/04/17     Health Maintenance  Topic Date Due   PAP SMEAR-Modifier  06/05/2019   COVID-19 Vaccine (3 - Booster for Pfizer series) 11/01/2019   Hepatitis C Screening  11/22/2020 (Originally 08/04/1997)   INFLUENZA VACCINE  12/23/2020   TETANUS/TDAP  02/27/2022   HIV Screening  Completed   Pneumococcal Vaccine 64-56 Years old  Aged Out   HPV VACCINES  Aged Out      Review of Systems Patient reports no vision/ hearing changes, adenopathy,fever,  persistant/recurrent hoarseness , swallowing issues, chest pain, palpitations, edema, persistant/recurrent cough, hemoptysis, dyspnea (rest/exertional/paroxysmal nocturnal), gastrointestinal bleeding (melena, rectal bleeding), abdominal pain, significant heartburn, bowel changes, GU symptoms (dysuria, hematuria, incontinence), Gyn symptoms (abnormal  bleeding, pain),  syncope, focal weakness, memory loss, numbness & tingling, skin/hair/nail changes, abnormal bruising or bleeding.   + anxiety/depression over health issues w/ son Barnabas Lister + weight gain  This visit occurred during the SARS-CoV-2 public health emergency.  Safety protocols were in place, including screening questions prior to the visit, additional usage of staff PPE, and extensive cleaning of exam room while observing appropriate contact time as indicated for disinfecting solutions.      Objective:   Physical Exam General Appearance:    Alert, cooperative, no distress, appears stated age, obese  Head:    Normocephalic, without  obvious abnormality, atraumatic  Eyes:    PERRL, conjunctiva/corneas clear, EOM's intact, fundi    benign, both eyes  Ears:    Normal TM's and external ear canals, both ears  Nose:   Deferred due to COVID  Throat:   Neck:   Supple, symmetrical, trachea midline, no adenopathy;    Thyroid: no enlargement/tenderness/nodules  Back:     Symmetric, no curvature, ROM normal, no CVA tenderness  Lungs:     Clear to auscultation bilaterally, respirations unlabored  Chest Wall:    No tenderness or deformity   Heart:    Regular rate and rhythm, S1 and S2 normal, no murmur, rub   or gallop  Breast Exam:    Deferred to GYN  Abdomen:     Soft, non-tender, bowel sounds active all four quadrants,    no masses, no organomegaly  Genitalia:    Deferred to GYN  Rectal:    Extremities:   Extremities normal, atraumatic, no cyanosis or edema  Pulses:   2+ and symmetric all extremities  Skin:   Skin color, texture, turgor normal, no rashes or lesions  Lymph nodes:   Cervical, supraclavicular, and axillary nodes normal  Neurologic:   CNII-XII intact, normal strength, sensation and reflexes    throughout          Assessment & Plan:

## 2020-11-21 NOTE — Assessment & Plan Note (Signed)
Pt's PE WNL w/ exception of obesity.  UTD on immunizations.  Pap and mammo scheduled.  Due for colonoscopy due to family hx- pt plans to schedule.  Check labs.  Anticipatory guidance provided.

## 2020-11-21 NOTE — Assessment & Plan Note (Signed)
Following w/ [facility].  S/p laser trabeculectomy.

## 2020-11-21 NOTE — Assessment & Plan Note (Signed)
Deteriorated.  BMI is now 35.98  Pt is very frustrated with herself for gaining weight.  Is now trying to exercise regularly and be mindful of her eating habits.  Check labs to risk stratify.  Will follow.

## 2020-11-22 ENCOUNTER — Other Ambulatory Visit: Payer: Self-pay

## 2020-11-22 ENCOUNTER — Other Ambulatory Visit (HOSPITAL_COMMUNITY): Payer: Self-pay

## 2020-11-22 MED ORDER — VITAMIN D (ERGOCALCIFEROL) 1.25 MG (50000 UNIT) PO CAPS
50000.0000 [IU] | ORAL_CAPSULE | ORAL | 0 refills | Status: AC
  Filled 2020-11-22: qty 12, 84d supply, fill #0

## 2020-11-26 ENCOUNTER — Ambulatory Visit
Admission: RE | Admit: 2020-11-26 | Discharge: 2020-11-26 | Disposition: A | Discharge status: Home / Self Care | Payer: 59 | Source: Ambulatory Visit | Attending: Obstetrics & Gynecology | Admitting: Obstetrics & Gynecology

## 2020-11-26 ENCOUNTER — Other Ambulatory Visit: Payer: Self-pay

## 2020-11-26 DIAGNOSIS — N644 Mastodynia: Secondary | ICD-10-CM

## 2020-11-26 DIAGNOSIS — R928 Other abnormal and inconclusive findings on diagnostic imaging of breast: Secondary | ICD-10-CM | POA: Diagnosis not present

## 2021-02-07 ENCOUNTER — Other Ambulatory Visit: Payer: Self-pay | Admitting: Internal Medicine

## 2021-02-10 ENCOUNTER — Other Ambulatory Visit (HOSPITAL_COMMUNITY): Payer: Self-pay

## 2021-02-10 ENCOUNTER — Other Ambulatory Visit: Payer: Self-pay | Admitting: Family Medicine

## 2021-02-14 ENCOUNTER — Ambulatory Visit: Payer: Self-pay | Admitting: Surgery

## 2021-02-14 DIAGNOSIS — N9089 Other specified noninflammatory disorders of vulva and perineum: Secondary | ICD-10-CM | POA: Diagnosis not present

## 2021-02-14 NOTE — H&P (Signed)
Jamie Nelson [id number]   Referring Provider:  Self   Subjective   Chief Complaint: perineal mass     History of Present Illness: Returns for evaluation of perineal cyst.  After our last visit, she did go through with MRI of the pelvis which demonstrates a 2.5 x 1.9 x 1.1 focal area of abnormal signal intensity in the subcutaneous fat of the right perineum, with intermediate T1 and high intensity T2 signal with diffuse enhancement after IV contrast administration.  Origin of the lesion was not evident but does not appear to arise from the anorectal anatomy.  No communication to the anus or rectum identified to suggest anal fistula, does not appear classically like a cyst of Bartholin's although complication of Bartholin cyst was a consideration.  Also consideration would be a small focus of infectious/inflammatory change although no surrounding inflammation was noted. She was scheduled to have this excised, but apparently the lesion disappeared and so she canceled her surgery.  She then noted that it returned at some point, and interestingly while she was riding a slide at the water park noted some pain in the area and found that the area had ruptured and was bleeding.  Since that time, which was a few weeks ago, she has had ongoing small volume bloody drainage from the area and the wound is essentially nonhealing.  It is slightly uncomfortable but not so painful that interferes with her daily life.  Initial visit 08/02/19:  Very pleasant 40 year old woman referred by Dr. Benjie Karvonen with a perineal mass. The patient states that she noticed this in approximately October of last year.  At that time it was not especially bothersome to her, she did not note any overlying skin changes, drainage, or pain.  At one point it seemed to dissipate.  However she recently had her annual exam and the mass was noted by her gynecologist, who then referred her for ultrasound.  This demonstrated a "2.2 x 1.3 x 1.1 cm  hypoechoic area with some areas of internal blood flow possibly reflecting hematoma, developing abscess... Could be a solid mass lesion" Since her exam, she has been noticing this lesion more, for example during intercourse or when having bowel movements. She does not think it has increased in size or changed significantly since she first noticed it several months ago.  She does have a strong family history of cancer- several uncles, and her father died at the age of 77.  She will be seeing Dr. Henrene Pastor next month to initiate screening colonoscopies.  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 Several reassuring features including stable size, characteristics on exam. Discussed options for further evaluation including MRI, core biopsy, or surgical excision. Given unusual location I do recommend proceeding with MRI at this point which will give more information about soft tissue characteristics and involvement of any adjacent muscular components. If this is truly discrete subcutaneous lesion (I suspect that is the case) I would recommend proceeding with excision. If there are concerning features on MRI I would recommend core needle biopsy. I will call her when the MRI is done.  Review of Systems: A complete review of systems was obtained from the patient.  I have reviewed this information and discussed as appropriate with the patient.  See HPI as well for  other ROS.   Medical History: Past Medical History:  Diagnosis Date   Anxiety    Glaucoma (increased eye pressure)     There is no problem list on file for this patient.   Past Surgical History:  Procedure Laterality Date   c-section x3     knee surgery     liver cyst removal surgery       No Known  Allergies  Current Outpatient Medications on File Prior to Visit  Medication Sig Dispense Refill   metFORMIN (GLUCOPHAGE-XR) 500 MG XR tablet Take 2 tablets by mouth 2 (two) times daily     sertraline (ZOLOFT) 100 MG tablet Take 100 mg by mouth once daily     spironolactone (ALDACTONE) 50 MG tablet TAKE 1 TABLET (50 MG TOTAL) BY MOUTH TWICE A DAY     No current facility-administered medications on file prior to visit.    Family History  Problem Relation Age of Onset   High blood pressure (Hypertension) Mother    Colon cancer Father      Social History   Tobacco Use  Smoking Status Never Smoker  Smokeless Tobacco Never Used     Social History   Socioeconomic History   Marital status: Married  Tobacco Use   Smoking status: Never Smoker   Smokeless tobacco: Never Used  Scientific laboratory technician Use: Never used  Substance and Sexual Activity   Alcohol use: Yes   Drug use: Never    Objective:    Vitals:   02/14/21 1438  BP: 132/84  Pulse: 94  Temp: 37.1 C (98.7 F)  SpO2: 98%  Weight: 96.4 kg (212 lb 9.6 oz)  Height: 165.1 cm (5\' 5" )    Body mass index is 35.38 kg/m.   Alert and well-appearing Unlabored respirations In the perineum just to the right of midline and essentially at the level of the perineal body may be slightly anterior there is a vaguely palpable subcutaneous soft mass approximately 2 cm in maximal diameter.  Inferior lateral aspect of this has an overlying granulating punctate wound.  No fluid expressed with compression.  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  Discussed planned procedure and risks of bleeding, infection, pain, scarring, injury to adjacent structures, possible open wound that would heal by secondary intention.  Questions welcomed and answered.  We will schedule at her convenience  Thersea Manfredonia Raquel James, MD

## 2021-02-21 ENCOUNTER — Other Ambulatory Visit: Payer: Self-pay | Admitting: Family Medicine

## 2021-02-21 ENCOUNTER — Encounter: Payer: Self-pay | Admitting: Physician Assistant

## 2021-02-21 ENCOUNTER — Telehealth: Payer: 59 | Admitting: Physician Assistant

## 2021-02-21 ENCOUNTER — Other Ambulatory Visit (HOSPITAL_COMMUNITY): Payer: Self-pay

## 2021-02-21 ENCOUNTER — Other Ambulatory Visit: Payer: Self-pay | Admitting: Internal Medicine

## 2021-02-21 DIAGNOSIS — N3000 Acute cystitis without hematuria: Secondary | ICD-10-CM

## 2021-02-21 MED ORDER — CIPROFLOXACIN HCL 500 MG PO TABS
500.0000 mg | ORAL_TABLET | Freq: Two times a day (BID) | ORAL | 0 refills | Status: DC
  Filled 2021-02-21: qty 28, 14d supply, fill #0

## 2021-02-21 NOTE — Progress Notes (Signed)
Based on what you shared with me, I feel your condition warrants further evaluation and I recommend that you be seen in a face to face visit.   NOTE: There will be NO CHARGE for this eVisit   If you are having a true medical emergency please call 911.      For an urgent face to face visit, [place] has six urgent care centers for your convenience:   Ms. Jamie Nelson,  Sorry you aren't feeling well!  It appears that you may have had similar symptoms more than once in the past 7 months. Recurrent UTIs warrant a face to face visit for further evaluation of your symptoms and labs including urine culture is done to ensure proper treatment.     [facility] [place] [facility] [place] Get Driving Directions [phone] [place], [place] [address]    [place] [facility]) Get Driving Directions [phone] [address] [place], [place] [address]  [place] [facility] [place] [facility]) Get Driving Directions [phone] [address] [place],  [place]  [address]  [place] [facility] Get Driving Directions [phone] [place], [place], [place] [address]   [facility] Get Driving Directions  [phone] [address] [place], [place] [address]   [facility] [place] Get Driving Directions [phone] [address], [place], [place] [address]  Your MyChart E-visit questionnaire answers were reviewed by a board certified advanced clinical practitioner to complete your personal care plan based on your specific symptoms.  Thank you for using e-Visits.   I spent 5-10 minutes on review and completion of this note- Jamie Nelson [facility]

## 2021-02-24 ENCOUNTER — Other Ambulatory Visit (HOSPITAL_COMMUNITY): Payer: Self-pay

## 2021-02-24 MED ORDER — METFORMIN HCL ER 500 MG PO TB24
1000.0000 mg | ORAL_TABLET | Freq: Two times a day (BID) | ORAL | 1 refills | Status: DC
  Filled 2021-02-24 – 2021-03-05 (×2): qty 360, 90d supply, fill #0
  Filled 2021-11-14: qty 360, 90d supply, fill #1

## 2021-03-04 ENCOUNTER — Other Ambulatory Visit (HOSPITAL_COMMUNITY): Payer: Self-pay

## 2021-03-05 ENCOUNTER — Other Ambulatory Visit (HOSPITAL_COMMUNITY): Payer: Self-pay

## 2021-03-06 ENCOUNTER — Other Ambulatory Visit: Payer: Self-pay | Admitting: Family Medicine

## 2021-03-06 ENCOUNTER — Other Ambulatory Visit (HOSPITAL_COMMUNITY): Payer: Self-pay

## 2021-03-07 ENCOUNTER — Other Ambulatory Visit: Payer: Self-pay | Admitting: Family Medicine

## 2021-03-07 ENCOUNTER — Other Ambulatory Visit: Payer: Self-pay

## 2021-03-07 ENCOUNTER — Other Ambulatory Visit (HOSPITAL_COMMUNITY): Payer: Self-pay

## 2021-03-07 ENCOUNTER — Encounter: Payer: Self-pay | Admitting: Family Medicine

## 2021-03-07 ENCOUNTER — Encounter: Payer: Self-pay | Admitting: *Deleted

## 2021-03-07 DIAGNOSIS — E282 Polycystic ovarian syndrome: Secondary | ICD-10-CM

## 2021-03-07 MED ORDER — SPIRONOLACTONE 50 MG PO TABS
50.0000 mg | ORAL_TABLET | Freq: Two times a day (BID) | ORAL | 0 refills | Status: DC
  Filled 2021-03-07: qty 180, 90d supply, fill #0

## 2021-03-07 NOTE — Telephone Encounter (Signed)
This encounter was created in error - please disregard.

## 2021-03-14 ENCOUNTER — Ambulatory Visit: Payer: 59

## 2021-03-17 ENCOUNTER — Other Ambulatory Visit (HOSPITAL_COMMUNITY): Payer: Self-pay

## 2021-04-01 ENCOUNTER — Encounter (HOSPITAL_BASED_OUTPATIENT_CLINIC_OR_DEPARTMENT_OTHER): Payer: Self-pay | Admitting: Surgery

## 2021-04-01 ENCOUNTER — Other Ambulatory Visit: Payer: Self-pay

## 2021-04-01 DIAGNOSIS — Z973 Presence of spectacles and contact lenses: Secondary | ICD-10-CM

## 2021-04-01 HISTORY — DX: Presence of spectacles and contact lenses: Z97.3

## 2021-04-01 NOTE — Progress Notes (Addendum)
Spoke w/ via phone for pre-op interview---pt Lab needs dos----     urine poct          Lab results------none COVID test -----patient states asymptomatic no test needed Arrive at -------530 am 04-04-2021 NPO after MN NO Solid Food.  Clear liquids from MN until---430 am Med rec completed Medications to take morning of surgery -----sertraline, certrizine prn Diabetic medication -----n/a Patient instructed no nail polish to be worn day of surgery Patient instructed to bring photo id and insurance card day of surgery Patient aware to have Driver (ride ) / caregiver    for 24 hours after surgery  spouse or mother Patient Special Instructions -----hibiclens shower chinr to toes hs and am of surgery (chin to toes) Pre-Op special Istructions -----none Patient verbalized understanding of instructions that were given at this phone interview. Patient denies shortness of breath, chest pain, fever, cough at this phone interview.

## 2021-04-03 NOTE — H&P (Signed)
Jamie Nelson [id number]    Referring Provider:  Self     Subjective    Chief Complaint: perineal mass       History of Present Illness: Returns for evaluation of perineal cyst.  After our last visit, she did go through with MRI of the pelvis which demonstrates a 2.5 x 1.9 x 1.1 focal area of abnormal signal intensity in the subcutaneous fat of the right perineum, with intermediate T1 and high intensity T2 signal with diffuse enhancement after IV contrast administration.  Origin of the lesion was not evident but does not appear to arise from the anorectal anatomy.  No communication to the anus or rectum identified to suggest anal fistula, does not appear classically like a cyst of Bartholin's although complication of Bartholin cyst was a consideration.  Also consideration would be a small focus of infectious/inflammatory change although no surrounding inflammation was noted. She was scheduled to have this excised, but apparently the lesion disappeared and so she canceled her surgery.  She then noted that it returned at some point, and interestingly while she was riding a slide at the water park noted some pain in the area and found that the area had ruptured and was bleeding.  Since that time, which was a few weeks ago, she has had ongoing small volume bloody drainage from the area and the wound is essentially nonhealing.  It is slightly uncomfortable but not so painful that interferes with her daily life.   Initial visit 08/02/19:  Very pleasant 40 year old woman referred by Dr. Benjie Karvonen with a perineal mass. The patient states that she noticed this in approximately October of last year.  At that time it was not especially bothersome to her, she did not note any overlying skin changes, drainage, or pain.  At one point it seemed to dissipate.  However she recently had her annual exam and the mass was noted by her gynecologist, who then referred her for ultrasound.  This demonstrated a "2.2 x 1.3 x 1.1  cm hypoechoic area with some areas of internal blood flow possibly reflecting hematoma, developing abscess... Could be a solid mass lesion" Since her exam, she has been noticing this lesion more, for example during intercourse or when having bowel movements. She does not think it has increased in size or changed significantly since she first noticed it several months ago.   She does have a strong family history of cancer- several uncles, and her father died at the age of 10.  She will be seeing Dr. Henrene Pastor next month to initiate screening colonoscopies.   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 Several reassuring features including stable size, characteristics on exam. Discussed options for further evaluation including MRI, core biopsy, or surgical excision. Given unusual location I do recommend proceeding with MRI at this point which will give more information about soft tissue characteristics and involvement of any adjacent muscular components. If this is truly discrete subcutaneous lesion (I suspect that is the case) I would recommend proceeding with excision. If there are concerning features on MRI I would recommend core needle biopsy. I will call her when the MRI is done.   Review of Systems: A complete review of systems was obtained from the patient.  I have reviewed this information and discussed as appropriate  with the patient.  See HPI as well for other ROS.     Medical History:     Past Medical History:  Diagnosis Date   Anxiety     Glaucoma (increased eye pressure)        There is no problem list on file for this patient.          Past Surgical History:  Procedure Laterality Date   c-section x3       knee surgery       liver cyst removal surgery           No Known Allergies         Current Outpatient Medications on File Prior to Visit  Medication Sig Dispense Refill   metFORMIN (GLUCOPHAGE-XR) 500 MG XR tablet Take 2 tablets by mouth 2 (two) times daily       sertraline (ZOLOFT) 100 MG tablet Take 100 mg by mouth once daily       spironolactone (ALDACTONE) 50 MG tablet TAKE 1 TABLET (50 MG TOTAL) BY MOUTH TWICE A DAY        No current facility-administered medications on file prior to visit.           Family History  Problem Relation Age of Onset   High blood pressure (Hypertension) Mother     Colon cancer Father        Social History       Tobacco Use  Smoking Status Never Smoker  Smokeless Tobacco Never Used      Social History        Socioeconomic History   Marital status: Married  Tobacco Use   Smoking status: Never Smoker   Smokeless tobacco: Never Used  Scientific laboratory technician Use: Never used  Substance and Sexual Activity   Alcohol use: Yes   Drug use: Never      Objective:         Vitals:    02/14/21 1438  BP: 132/84  Pulse: 94  Temp: 37.1 C (98.7 F)  SpO2: 98%  Weight: 96.4 kg (212 lb 9.6 oz)  Height: 165.1 cm (5\' 5" )    Body mass index is 35.38 kg/m.     Alert and well-appearing Unlabored respirations In the perineum just to the right of midline and essentially at the level of the perineal body may be slightly anterior there is a vaguely palpable subcutaneous soft mass approximately 2 cm in maximal diameter.  Inferior lateral aspect of this has an overlying granulating punctate wound.  No fluid expressed with compression.   Assessment and Plan:  Diagnoses and all orders for this visit:   Perineal cyst in female     She is ready to proceed with excision.  Discussed planned procedure and risks of bleeding, infection, pain, scarring, injury to adjacent structures, possible open wound that would heal by secondary intention.  Questions welcomed and answered.  We will schedule at her  convenience   Ryder Chesmore Raquel James, MD

## 2021-04-04 ENCOUNTER — Ambulatory Visit (HOSPITAL_BASED_OUTPATIENT_CLINIC_OR_DEPARTMENT_OTHER): Payer: 59 | Admitting: Anesthesiology

## 2021-04-04 ENCOUNTER — Ambulatory Visit (HOSPITAL_BASED_OUTPATIENT_CLINIC_OR_DEPARTMENT_OTHER)
Admission: RE | Admit: 2021-04-04 | Discharge: 2021-04-04 | Disposition: A | Discharge status: Home / Self Care | Payer: 59 | Attending: Surgery | Admitting: Surgery

## 2021-04-04 ENCOUNTER — Other Ambulatory Visit: Payer: Self-pay

## 2021-04-04 ENCOUNTER — Encounter (HOSPITAL_BASED_OUTPATIENT_CLINIC_OR_DEPARTMENT_OTHER)
Admission: RE | Disposition: A | Discharge status: Home / Self Care | Payer: Self-pay | Source: Home / Self Care | Attending: Surgery

## 2021-04-04 ENCOUNTER — Other Ambulatory Visit (HOSPITAL_COMMUNITY): Payer: Self-pay

## 2021-04-04 ENCOUNTER — Encounter (HOSPITAL_BASED_OUTPATIENT_CLINIC_OR_DEPARTMENT_OTHER): Payer: Self-pay | Admitting: Surgery

## 2021-04-04 DIAGNOSIS — L729 Follicular cyst of the skin and subcutaneous tissue, unspecified: Secondary | ICD-10-CM | POA: Diagnosis not present

## 2021-04-04 DIAGNOSIS — E282 Polycystic ovarian syndrome: Secondary | ICD-10-CM | POA: Diagnosis not present

## 2021-04-04 DIAGNOSIS — L089 Local infection of the skin and subcutaneous tissue, unspecified: Secondary | ICD-10-CM | POA: Diagnosis not present

## 2021-04-04 DIAGNOSIS — J302 Other seasonal allergic rhinitis: Secondary | ICD-10-CM | POA: Diagnosis not present

## 2021-04-04 DIAGNOSIS — N750 Cyst of Bartholin's gland: Secondary | ICD-10-CM | POA: Diagnosis not present

## 2021-04-04 DIAGNOSIS — K603 Anal fistula: Secondary | ICD-10-CM | POA: Insufficient documentation

## 2021-04-04 DIAGNOSIS — Z809 Family history of malignant neoplasm, unspecified: Secondary | ICD-10-CM | POA: Insufficient documentation

## 2021-04-04 DIAGNOSIS — E559 Vitamin D deficiency, unspecified: Secondary | ICD-10-CM | POA: Diagnosis not present

## 2021-04-04 DIAGNOSIS — L929 Granulomatous disorder of the skin and subcutaneous tissue, unspecified: Secondary | ICD-10-CM | POA: Diagnosis not present

## 2021-04-04 HISTORY — DX: Personal history of urinary (tract) infections: Z87.440

## 2021-04-04 HISTORY — PX: CYST EXCISION PERINEAL: SHX6278

## 2021-04-04 LAB — POCT PREGNANCY, URINE: Preg Test, Ur: NEGATIVE

## 2021-04-04 SURGERY — EXCISION, CYST, PERINEUM
Anesthesia: Monitor Anesthesia Care | Site: Perineum

## 2021-04-04 MED ORDER — LIDOCAINE 2% (20 MG/ML) 5 ML SYRINGE
INTRAMUSCULAR | Status: DC | PRN
Start: 2021-04-04 — End: 2021-04-04
  Administered 2021-04-04: 100 mg via INTRAVENOUS

## 2021-04-04 MED ORDER — PROPOFOL 500 MG/50ML IV EMUL
INTRAVENOUS | Status: AC
  Filled 2021-04-04: qty 50

## 2021-04-04 MED ORDER — BUPIVACAINE LIPOSOME 1.3 % IJ SUSP: 20.0000 mL | Freq: Once | INTRAMUSCULAR | Status: DC

## 2021-04-04 MED ORDER — PROPOFOL 500 MG/50ML IV EMUL
INTRAVENOUS | Status: DC | PRN
  Administered 2021-04-04: 100 ug/kg/min via INTRAVENOUS

## 2021-04-04 MED ORDER — LACTATED RINGERS IV SOLN: INTRAVENOUS | Status: DC

## 2021-04-04 MED ORDER — CHLORHEXIDINE GLUCONATE 4 % EX LIQD: 60.0000 mL | Freq: Once | CUTANEOUS | Status: DC

## 2021-04-04 MED ORDER — LIDOCAINE 2% (20 MG/ML) 5 ML SYRINGE
INTRAMUSCULAR | Status: AC
  Filled 2021-04-04: qty 5

## 2021-04-04 MED ORDER — CEFAZOLIN SODIUM-DEXTROSE 2-4 GM/100ML-% IV SOLN
2.0000 g | INTRAVENOUS | Status: AC
  Administered 2021-04-04: 2 g via INTRAVENOUS

## 2021-04-04 MED ORDER — DEXAMETHASONE SODIUM PHOSPHATE 10 MG/ML IJ SOLN
INTRAMUSCULAR | Status: DC | PRN
Start: 2021-04-04 — End: 2021-04-04
  Administered 2021-04-04: 4 mg via INTRAVENOUS

## 2021-04-04 MED ORDER — ACETAMINOPHEN 500 MG PO TABS
1000.0000 mg | ORAL_TABLET | ORAL | Status: AC
  Administered 2021-04-04: 1000 mg via ORAL

## 2021-04-04 MED ORDER — TRAMADOL HCL 50 MG PO TABS
50.0000 mg | ORAL_TABLET | Freq: Four times a day (QID) | ORAL | 0 refills | Status: AC | PRN
  Filled 2021-04-04: qty 20, 5d supply, fill #0

## 2021-04-04 MED ORDER — MIDAZOLAM HCL 5 MG/5ML IJ SOLN
INTRAMUSCULAR | Status: DC | PRN
  Administered 2021-04-04: 2 mg via INTRAVENOUS

## 2021-04-04 MED ORDER — PROPOFOL 10 MG/ML IV BOLUS
INTRAVENOUS | Status: DC | PRN
  Administered 2021-04-04 (×2): 20 mg via INTRAVENOUS

## 2021-04-04 MED ORDER — DEXAMETHASONE SODIUM PHOSPHATE 10 MG/ML IJ SOLN
INTRAMUSCULAR | Status: AC
  Filled 2021-04-04: qty 1

## 2021-04-04 MED ORDER — ONDANSETRON HCL 4 MG/2ML IJ SOLN
INTRAMUSCULAR | Status: DC | PRN
  Administered 2021-04-04: 4 mg via INTRAVENOUS

## 2021-04-04 MED ORDER — GABAPENTIN 300 MG PO CAPS
300.0000 mg | ORAL_CAPSULE | ORAL | Status: AC
  Administered 2021-04-04: 300 mg via ORAL

## 2021-04-04 MED ORDER — FENTANYL CITRATE (PF) 250 MCG/5ML IJ SOLN
INTRAMUSCULAR | Status: DC | PRN
  Administered 2021-04-04 (×3): 25 ug via INTRAVENOUS

## 2021-04-04 MED ORDER — BUPIVACAINE-EPINEPHRINE 0.25% -1:200000 IJ SOLN
INTRAMUSCULAR | Status: DC | PRN
  Administered 2021-04-04: 15 mL

## 2021-04-04 MED ORDER — ACETAMINOPHEN 500 MG PO TABS
ORAL_TABLET | ORAL | Status: AC
  Filled 2021-04-04: qty 2

## 2021-04-04 MED ORDER — PROMETHAZINE HCL 25 MG/ML IJ SOLN: 6.2500 mg | INTRAMUSCULAR | Status: DC | PRN

## 2021-04-04 MED ORDER — MIDAZOLAM HCL 2 MG/2ML IJ SOLN
INTRAMUSCULAR | Status: AC
  Filled 2021-04-04: qty 2

## 2021-04-04 MED ORDER — GABAPENTIN 300 MG PO CAPS
ORAL_CAPSULE | ORAL | Status: AC
  Filled 2021-04-04: qty 1

## 2021-04-04 MED ORDER — FENTANYL CITRATE (PF) 100 MCG/2ML IJ SOLN
INTRAMUSCULAR | Status: AC
  Filled 2021-04-04: qty 2

## 2021-04-04 MED ORDER — CEFAZOLIN SODIUM-DEXTROSE 2-4 GM/100ML-% IV SOLN
INTRAVENOUS | Status: AC
  Filled 2021-04-04: qty 100

## 2021-04-04 MED ORDER — ONDANSETRON HCL 4 MG/2ML IJ SOLN
INTRAMUSCULAR | Status: AC
  Filled 2021-04-04: qty 2

## 2021-04-04 MED ORDER — FENTANYL CITRATE (PF) 100 MCG/2ML IJ SOLN: 25.0000 ug | INTRAMUSCULAR | Status: DC | PRN

## 2021-04-04 MED ORDER — LIDOCAINE HCL 1 % IJ SOLN
INTRAMUSCULAR | Status: DC | PRN
  Administered 2021-04-04: 20 mL

## 2021-04-04 MED ORDER — KETOROLAC TROMETHAMINE 30 MG/ML IJ SOLN: 30.0000 mg | Freq: Once | INTRAMUSCULAR | Status: DC | PRN

## 2021-04-04 SURGICAL SUPPLY — 43 items
ADH SKN CLS APL DERMABOND .7 (GAUZE/BANDAGES/DRESSINGS)
BLADE SURG 10 STRL SS (BLADE) IMPLANT
BLADE SURG 15 STRL LF DISP TIS (BLADE) ×1 IMPLANT
BLADE SURG 15 STRL SS (BLADE) ×2
BNDG GAUZE ELAST 4 BULKY (GAUZE/BANDAGES/DRESSINGS) ×1 IMPLANT
COVER BACK TABLE 60X90IN (DRAPES) ×2 IMPLANT
COVER MAYO STAND STRL (DRAPES) IMPLANT
DECANTER SPIKE VIAL GLASS SM (MISCELLANEOUS) ×2 IMPLANT
DERMABOND ADVANCED (GAUZE/BANDAGES/DRESSINGS)
DERMABOND ADVANCED .7 DNX12 (GAUZE/BANDAGES/DRESSINGS) IMPLANT
DRAPE HYSTEROSCOPY (MISCELLANEOUS) ×1 IMPLANT
DRAPE LAPAROTOMY 100X72 PEDS (DRAPES) ×1 IMPLANT
DRAPE SHEET LG 3/4 BI-LAMINATE (DRAPES) ×1 IMPLANT
GAUZE 4X4 16PLY ~~LOC~~+RFID DBL (SPONGE) ×2 IMPLANT
GAUZE SPONGE 4X4 12PLY STRL (GAUZE/BANDAGES/DRESSINGS) IMPLANT
GLOVE SURG ENC MOIS LTX SZ6 (GLOVE) ×4 IMPLANT
GLOVE SURG UNDER POLY LF SZ6.5 (GLOVE) ×2 IMPLANT
GOWN STRL REUS W/TWL LRG LVL3 (GOWN DISPOSABLE) ×2 IMPLANT
KIT SIGMOIDOSCOPE (SET/KITS/TRAYS/PACK) IMPLANT
KIT TURNOVER CYSTO (KITS) ×2 IMPLANT
LEGGING LITHOTOMY PAIR STRL (DRAPES) ×1 IMPLANT
NDL HYPO 25X1 1.5 SAFETY (NEEDLE) IMPLANT
NEEDLE HYPO 22GX1.5 SAFETY (NEEDLE) ×2 IMPLANT
NEEDLE HYPO 25X1 1.5 SAFETY (NEEDLE) ×2 IMPLANT
PACK BASIN DAY SURGERY FS (CUSTOM PROCEDURE TRAY) ×2 IMPLANT
PANTS MESH DISP LRG (UNDERPADS AND DIAPERS) IMPLANT
PANTS MESH DISPOSABLE L (UNDERPADS AND DIAPERS) ×1
PENCIL SMOKE EVACUATOR (MISCELLANEOUS) ×2 IMPLANT
SHEARS HARMONIC 9CM CVD (BLADE) IMPLANT
SLEEVE SURGEON STRL (DRAPES) ×1 IMPLANT
SPONGE SURGIFOAM ABS GEL 100 (HEMOSTASIS) ×1 IMPLANT
SPONGE T-LAP 18X18 ~~LOC~~+RFID (SPONGE) IMPLANT
STRIP CLOSURE SKIN 1/2X4 (GAUZE/BANDAGES/DRESSINGS) ×1 IMPLANT
SUT CHROMIC 3 0 SH 27 (SUTURE) IMPLANT
SUT MNCRL AB 4-0 PS2 18 (SUTURE) IMPLANT
SUT VIC AB 3-0 SH 27 (SUTURE) ×4
SUT VIC AB 3-0 SH 27X BRD (SUTURE) IMPLANT
SYR BULB IRRIG 60ML STRL (SYRINGE) ×1 IMPLANT
SYR CONTROL 10ML LL (SYRINGE) ×2 IMPLANT
TOWEL OR 17X26 10 PK STRL BLUE (TOWEL DISPOSABLE) ×3 IMPLANT
TRAY DSU PREP LF (CUSTOM PROCEDURE TRAY) ×2 IMPLANT
TUBE CONNECTING 12X1/4 (SUCTIONS) ×1 IMPLANT
YANKAUER SUCT BULB TIP NO VENT (SUCTIONS) ×2 IMPLANT

## 2021-04-04 NOTE — Anesthesia Preprocedure Evaluation (Signed)
Anesthesia Evaluation  Patient identified by MRN, date of birth, ID band Patient awake    Reviewed: Allergy & Precautions, NPO status , Patient's Chart, lab work & pertinent test results  Airway Mallampati: II  TM Distance: >3 FB Neck ROM: Full    Dental no notable dental hx.    Pulmonary neg pulmonary ROS,    Pulmonary exam normal breath sounds clear to auscultation       Cardiovascular negative cardio ROS Normal cardiovascular exam Rhythm:Regular Rate:Normal     Neuro/Psych negative neurological ROS  negative psych ROS   GI/Hepatic negative GI ROS, Neg liver ROS,   Endo/Other  PCOS  Renal/GU negative Renal ROS  negative genitourinary   Musculoskeletal negative musculoskeletal ROS (+)   Abdominal   Peds negative pediatric ROS (+)  Hematology negative hematology ROS (+)   Anesthesia Other Findings   Reproductive/Obstetrics negative OB ROS                             Anesthesia Physical Anesthesia Plan  ASA: 2  Anesthesia Plan: MAC   Post-op Pain Management:    Induction: Intravenous  PONV Risk Score and Plan: 2 and Propofol infusion, Treatment may vary due to age or medical condition and Ondansetron  Airway Management Planned: Simple Face Mask  Additional Equipment:   Intra-op Plan:   Post-operative Plan:   Informed Consent: I have reviewed the patients History and Physical, chart, labs and discussed the procedure including the risks, benefits and alternatives for the proposed anesthesia with the patient or authorized representative who has indicated his/her understanding and acceptance.     Dental advisory given  Plan Discussed with: CRNA and Surgeon  Anesthesia Plan Comments:         Anesthesia Quick Evaluation

## 2021-04-04 NOTE — Anesthesia Procedure Notes (Signed)
Date/Time: 04/04/2021 7:38 AM Performed by: Bonney Aid, CRNA Pre-anesthesia Checklist: Patient identified, Emergency Drugs available, Suction available, Patient being monitored and Timeout performed Patient Re-evaluated:Patient Re-evaluated prior to induction Oxygen Delivery Method: Simple face mask Placement Confirmation: positive ETCO2

## 2021-04-04 NOTE — Discharge Instructions (Addendum)
GENERAL SURGERY: POST OP INSTRUCTIONS  EAT Gradually transition to a high fiber diet with a fiber supplement over the next few weeks after discharge.  Start with a pureed / full liquid diet (see below)  WALK Walk an hour a day (cumulative, not all at once).  Control your pain to do that.    CONTROL PAIN Control pain so that you can walk, sleep, tolerate sneezing/coughing, go up/down stairs.  HAVE A BOWEL MOVEMENT DAILY Keep your bowels regular to avoid problems.  OK to try a laxative to override constipation.  OK to use an antidairrheal to slow down diarrhea.  Call if not better after 2 tries  CALL IF YOU HAVE PROBLEMS/CONCERNS Call if you are still struggling despite following these instructions. Call if you have concerns not answered by these instructions    DIET: Follow a light bland diet & liquids the first 24 hours after arrival home, such as soup, liquids, starches, etc.  Be sure to drink plenty of fluids.  Quickly advance to a usual solid diet within a few days.  Avoid fast food or heavy meals as your are more likely to get nauseated or have irregular bowels.  A low-sugar, high-fiber diet for the rest of your life is ideal.   Take your usually prescribed home medications unless otherwise directed. PAIN CONTROL: Pain is best controlled by a usual combination of three different methods TOGETHER: Ice/Heat Over the counter pain medication Prescription pain medication Most patients will experience some swelling and bruising around the incisions.  Ice packs or heating pads (30-60 minutes up to 6 times a day) will help. Use ice for the first few days to help decrease swelling and bruising, then switch to heat to help relax tight/sore spots and speed recovery.  Some people prefer to use ice alone, heat alone, alternating between ice & heat.  Experiment to what works for you.  Swelling and bruising can take several weeks to resolve.   It is helpful to take an over-the-counter pain  medication regularly for the first few weeks.  Choose one of the following that works best for you: Naproxen (Aleve, etc)  Two 220mg  tabs twice a day OR Ibuprofen (Advil, etc) Three 200mg  tabs four times a day (every meal & bedtime) AND Acetaminophen (Tylenol, etc) 500-650mg  four times a day (every meal & bedtime) A  prescription for pain medication (such as oxycodone, hydrocodone, etc) should be given to you upon discharge.  Take your pain medication as prescribed.  If you are having problems/concerns with the prescription medicine (does not control pain, nausea, vomiting, rash, itching, etc), please call us [phone] to see if we need to switch you to a different pain medicine that will work better for you and/or control your side effect better. If you need a refill on your pain medication, please contact your pharmacy.  They will contact our office to request authorization. Prescriptions will not be filled after 5 pm or on week-ends. Avoid getting constipated.  Between the surgery and the pain medications, it is common to experience some constipation.  Increasing fluid intake and taking a fiber supplement (such as Metamucil, Citrucel, FiberCon, MiraLax, etc) 1-2 times a day regularly will usually help prevent this problem from occurring.  A mild laxative (prune juice, Milk of Magnesia, MiraLax, etc) should be taken according to package directions if there are no bowel movements after 48 hours.   Wash / shower every day, starting 2 days after surgery.  You may shower over the  steri strips as they are waterproof.  Continue to shower over incision(s) after the dressing is off. Remove your outer bandage 2 days after surgery; steri strips will peel off after 1-2 weeks.  You may leave the incision open to air.  You may have skin tapes (Steri Strips) covering the incision(s).  Leave them on until one week, then remove.  You may replace a dressing/Band-Aid to cover the incision for comfort if you wish.       ACTIVITIES as tolerated:   You may resume regular (light) daily activities beginning the next day--such as daily self-care, walking, climbing stairs--gradually increasing activities as tolerated.  If you can walk 30 minutes without difficulty, it is safe to try more intense activity such as jogging, treadmill, bicycling, low-impact aerobics, swimming, etc. Save the most intensive and strenuous activity for last such as sit-ups, heavy lifting, contact sports, etc  Refrain from any heavy lifting or straining until you are off narcotics for pain control.   DO NOT PUSH THROUGH PAIN.  Let pain be your guide: If it hurts to do something, don't do it.  Pain is your body warning you to avoid that activity for another week until the pain goes down. You may drive when you are no longer taking prescription pain medication, you can comfortably wear a seatbelt, and you can safely maneuver your car and apply brakes. You may have sexual intercourse when it is comfortable.  FOLLOW UP in our office Please call [facility] at [phone] to set up an appointment to see your surgeon in the office for a follow-up appointment approximately 2-3 weeks after your surgery. Make sure that you call for this appointment the day you arrive home to insure a convenient appointment time. 9. IF YOU HAVE DISABILITY OR FAMILY LEAVE FORMS, BRING THEM TO THE OFFICE FOR PROCESSING.  DO NOT GIVE THEM TO YOUR DOCTOR.   WHEN TO CALL us [phone]: Poor pain control Reactions / problems with new medications (rash/itching, nausea, etc)  Fever over 101.5 F (38.5 C) Worsening swelling or bruising Continued bleeding from incision. Increased pain, redness, or drainage from the incision Difficulty breathing / swallowing   The clinic staff is available to answer your questions during regular business hours (8:30am-5pm).  Please don't hesitate to call and ask to speak to one of our nurses for clinical concerns.   If you have a medical  emergency, go to the nearest emergency room or call 911.  A surgeon from [facility] is always on call at the [facility], [place], [place], [place], [place]  [address] ? MAIN: [phone] ? TOLL FREE: [phone] ?  FAX [phone] [email or web address]   NO TYLENOL CONTAINING PRODUCTS UNTIL AFTER 12:10 PM TODAY.     Post Anesthesia Home Care Instructions  Activity: Get plenty of rest for the remainder of the day. A responsible individual must stay with you for 24 hours following the procedure.  For the next 24 hours, DO NOT: -Drive a car -Paediatric nurse -Drink alcoholic beverages -Take any medication unless instructed by your physician -Make any legal decisions or sign important papers.  Meals: Start with liquid foods such as gelatin or soup. Progress to regular foods as tolerated. Avoid greasy, spicy, heavy foods. If nausea and/or vomiting occur, drink only clear liquids until the nausea and/or vomiting subsides. Call your physician if vomiting continues.  Special Instructions/Symptoms: Your throat may feel dry or sore from the anesthesia or  the breathing tube placed in your throat during surgery. If this causes discomfort, gargle with warm salt water. The discomfort should disappear within 24 hours.

## 2021-04-04 NOTE — Op Note (Signed)
Operative Note  GERALYNN CAPRI  [id number]  [id number]  04/04/2021   Surgeon: Romana Juniper MD FACS   Procedure performed: Excision of perineal cyst and fistula tract   Preop diagnosis: Chronic perineal cyst with chronic drainage Post-op diagnosis/intraop findings: Likely Bartholin's gland cyst fistulized to the perineal skin   Specimens: Perineal mass Retained items: No EBL: 33LK Complications: none   Description of procedure: After obtaining informed consent the patient was taken to the operating room and placed in dorsal lithotomy position on the operating room table where MAC was initiated, preoperative antibiotics were administered, SCDs applied, and a formal timeout was performed.  The perineum was prepped and draped in usual sterile fashion.  The area of concern was identified to the right of midline at the level of the perineal body with palpable induration and fullness extending medially and anteriorly towards the vaginal canal.  The skin and soft tissue were infiltrated with 1% lidocaine and ultimately additionally with quarter percent Marcaine with epinephrine and a elliptical incision was made along the Langer's lines encompassing the area of chronic drainage and chronic granulation tissue in the skin.  The dermis and soft tissue were divided with cautery and the palpable abnormality was followed and dissected out and noted to extend deep/cephalad, medially and anteriorly essentially abutting the dermis of the vaginal wall.  There was no overt opening into the vagina that could be visualized.  Once all palpable thickened abnormal tissue had been dissected from the surrounding soft tissues, the lesion was amputated.  A lacrimal duct probe was used to probe the fistula tract within the specimen which was noted to extend from the skin of the specimen all the way to the deep margin, consistent with a fistula.  This lesion was handed off for pathology.  Intraoperative consultation with  one of the gynecologists was performed and there was agreement that the standard of care would be to leave what ever fistula tract remains and anticipate that this will drain into the vagina.  On reinspection of the wound, the tract was no longer palpable or visible in the deeper tissues.  Hemostasis was achieved with cautery and with 3-0 Vicryl suture ligature in the deeper part of the wound.  The the deeper aspect of the wound was not closed as this would tether the labia/vagina laterally; the superficial soft tissue was closed with interrupted 3-0 Vicryls and the skin was closed with interrupted subcuticular 4-0 Monocryl's followed by benzoin, Steri-Strips, and a light pressure dressing.  The patient was then awakened, returned to supine position on the stretcher and taken to PACU in stable condition.    All counts were correct at the completion of the case.

## 2021-04-04 NOTE — Anesthesia Postprocedure Evaluation (Signed)
Anesthesia Post Note  Patient: Jamie Nelson  Procedure(s) Performed: EXCISION OF PERINEAL CYST (Perineum)     Patient location during evaluation: PACU Anesthesia Type: MAC Level of consciousness: awake and alert Pain management: pain level controlled Vital Signs Assessment: post-procedure vital signs reviewed and stable Respiratory status: spontaneous breathing, nonlabored ventilation, respiratory function stable and patient connected to nasal cannula oxygen Cardiovascular status: stable and blood pressure returned to baseline Postop Assessment: no apparent nausea or vomiting Anesthetic complications: no   No notable events documented.  Last Vitals:  Vitals:   04/04/21 0600 04/04/21 0852  BP: (!) 141/88 122/72  Pulse: 82 66  Resp: 17 17  Temp: 37.4 C   SpO2: 99% 98%    Last Pain:  Vitals:   04/04/21 0600  TempSrc: Oral                 Yarisa Lynam S

## 2021-04-04 NOTE — Transfer of Care (Signed)
Immediate Anesthesia Transfer of Care Note  Patient: Jamie Nelson  Procedure(s) Performed: EXCISION OF PERINEAL CYST (Perineum)  Patient Location: PACU  Anesthesia Type:MAC  Level of Consciousness: awake, alert  and oriented  Airway & Oxygen Therapy: Patient Spontanous Breathing  Post-op Assessment: Report given to RN  Post vital signs: Reviewed and stable  Last Vitals:  Vitals Value Taken Time  BP 122/72   Temp    Pulse 66 04/04/21 0852  Resp 17 04/04/21 0852  SpO2 98 % 04/04/21 0852  Vitals shown include unvalidated device data.  Last Pain:  Vitals:   04/04/21 0600  TempSrc: Oral         Complications: No notable events documented.

## 2021-04-07 ENCOUNTER — Encounter (HOSPITAL_BASED_OUTPATIENT_CLINIC_OR_DEPARTMENT_OTHER): Payer: Self-pay | Admitting: Surgery

## 2021-04-07 LAB — SURGICAL PATHOLOGY

## 2021-04-07 NOTE — Addendum Note (Signed)
Addendum  created 04/07/21 1015 by Rogers Blocker, CRNA   Charge Capture section accepted

## 2021-05-09 ENCOUNTER — Ambulatory Visit: Payer: 59 | Admitting: Family Medicine

## 2021-05-15 ENCOUNTER — Other Ambulatory Visit (HOSPITAL_COMMUNITY): Payer: Self-pay

## 2021-05-15 ENCOUNTER — Ambulatory Visit: Payer: 59 | Admitting: Family Medicine

## 2021-05-15 ENCOUNTER — Encounter: Payer: Self-pay | Admitting: Family Medicine

## 2021-05-15 VITALS — BP 112/78 | HR 65 | Temp 97.6°F | Resp 16 | Wt 217.8 lb

## 2021-05-15 DIAGNOSIS — R1012 Left upper quadrant pain: Secondary | ICD-10-CM

## 2021-05-15 DIAGNOSIS — K219 Gastro-esophageal reflux disease without esophagitis: Secondary | ICD-10-CM | POA: Diagnosis not present

## 2021-05-15 MED ORDER — OMEPRAZOLE 20 MG PO CPDR
20.0000 mg | DELAYED_RELEASE_CAPSULE | Freq: Every day | ORAL | 3 refills | Status: DC
  Filled 2021-05-15: qty 30, 30d supply, fill #0
  Filled 2021-11-14: qty 30, 30d supply, fill #1
  Filled 2022-01-05: qty 30, 30d supply, fill #2

## 2021-05-15 NOTE — Patient Instructions (Signed)
Follow up as needed or as scheduled We'll call you to schedule the ultrasound START the Omeprazole once daily Call with any questions or concerns Stay Safe!  Stay Healthy! Happy Holidays!!!

## 2021-05-15 NOTE — Progress Notes (Signed)
° °  Subjective:    Patient ID: Jamie Nelson, female    DOB: 11-Mar-1981, 40 y.o.   MRN: [id number]  HPI Rib pain- pt was at work a few weeks ago and felt a tightness up under L ribs.  Describes is as when pregnant and knee is up under the ribs.  Sxs are worse after eating or drinking large amounts of water.  'it feels like my ribs are sitting on something'.  Has been eating smaller meals.  Sxs also notable w/ frequent stooping or bending.  Increased reflux recently.  Wonders about a hiatal hernia vs cyst.  Has hx of multiple cysts in the past- one in the liver, bartholins.       Review of Systems For ROS see HPI   This visit occurred during the SARS-CoV-2 public health emergency.  Safety protocols were in place, including screening questions prior to the visit, additional usage of staff PPE, and extensive cleaning of exam room while observing appropriate contact time as indicated for disinfecting solutions.      Objective:   Physical Exam Vitals reviewed.  Constitutional:      General: She is not in acute distress.    Appearance: Normal appearance. She is obese. She is not ill-appearing.  HENT:     Head: Normocephalic and atraumatic.  Eyes:     Extraocular Movements: Extraocular movements intact.     Conjunctiva/sclera: Conjunctivae normal.     Pupils: Pupils are equal, round, and reactive to light.  Abdominal:     General: There is no distension.     Palpations: Abdomen is soft. There is no mass.     Tenderness: There is no abdominal tenderness. There is no guarding or rebound.  Skin:    General: Skin is warm and dry.  Neurological:     General: No focal deficit present.     Mental Status: She is alert and oriented to person, place, and time.  Psychiatric:        Mood and Affect: Mood normal.        Behavior: Behavior normal.        Thought Content: Thought content normal.          Assessment & Plan:   LUQ discomfort- new.  No TTP over palpable abnormality.  Worse w/  eating or repeated bending.  Suspect some component of reflux/increased gas and maybe even a hiatal hernia.  Will start w/ [place] to assess and if [place] is inconclusive or suggestive of something requiring additional workup we will proceed to CT scan.  Pt expressed understanding and is in agreement w/ plan.   GERD- new.  Pt reports she has not had reflux since pregnancy but has noticed sxs recently.  Suspect this may be related to her LUQ discomfort.  Start PPI and monitor for improvement.  Pt expressed understanding and is in agreement w/ plan.

## 2021-05-21 ENCOUNTER — Ambulatory Visit (HOSPITAL_COMMUNITY)
Admission: RE | Admit: 2021-05-21 | Discharge: 2021-05-21 | Disposition: A | Discharge status: Home / Self Care | Payer: 59 | Source: Ambulatory Visit | Attending: Family Medicine | Admitting: Family Medicine

## 2021-05-21 ENCOUNTER — Other Ambulatory Visit (HOSPITAL_COMMUNITY): Payer: Self-pay

## 2021-05-21 ENCOUNTER — Other Ambulatory Visit: Payer: Self-pay

## 2021-05-21 DIAGNOSIS — Z113 Encounter for screening for infections with a predominantly sexual mode of transmission: Secondary | ICD-10-CM | POA: Diagnosis not present

## 2021-05-21 DIAGNOSIS — Z01411 Encounter for gynecological examination (general) (routine) with abnormal findings: Secondary | ICD-10-CM | POA: Diagnosis not present

## 2021-05-21 DIAGNOSIS — Z01419 Encounter for gynecological examination (general) (routine) without abnormal findings: Secondary | ICD-10-CM | POA: Diagnosis not present

## 2021-05-21 DIAGNOSIS — R1012 Left upper quadrant pain: Secondary | ICD-10-CM | POA: Diagnosis not present

## 2021-05-21 DIAGNOSIS — R1032 Left lower quadrant pain: Secondary | ICD-10-CM | POA: Diagnosis not present

## 2021-05-21 DIAGNOSIS — Z124 Encounter for screening for malignant neoplasm of cervix: Secondary | ICD-10-CM | POA: Diagnosis not present

## 2021-05-21 DIAGNOSIS — K7689 Other specified diseases of liver: Secondary | ICD-10-CM | POA: Diagnosis not present

## 2021-05-21 DIAGNOSIS — Z6836 Body mass index (BMI) 36.0-36.9, adult: Secondary | ICD-10-CM | POA: Diagnosis not present

## 2021-05-21 MED ORDER — WEGOVY 0.25 MG/0.5ML ~~LOC~~ SOAJ
0.2500 mg | SUBCUTANEOUS | 1 refills | Status: DC
  Filled 2021-05-21 – 2021-05-28 (×3): qty 2, 28d supply, fill #0

## 2021-05-22 ENCOUNTER — Other Ambulatory Visit (HOSPITAL_COMMUNITY): Payer: Self-pay

## 2021-05-27 ENCOUNTER — Encounter: Payer: Self-pay | Admitting: Family Medicine

## 2021-05-28 ENCOUNTER — Other Ambulatory Visit (HOSPITAL_COMMUNITY): Payer: Self-pay

## 2021-05-28 NOTE — Progress Notes (Signed)
Patient communicated via my chart in regards to [place]

## 2021-06-13 ENCOUNTER — Other Ambulatory Visit (HOSPITAL_COMMUNITY): Payer: Self-pay

## 2021-06-13 MED ORDER — CARESTART COVID-19 HOME TEST VI KIT
PACK | 0 refills | Status: DC
  Filled 2021-06-13: qty 4, 4d supply, fill #0

## 2021-06-22 ENCOUNTER — Encounter: Payer: Self-pay | Admitting: Family Medicine

## 2021-06-23 ENCOUNTER — Telehealth: Payer: 59 | Admitting: Emergency Medicine

## 2021-06-23 ENCOUNTER — Other Ambulatory Visit (HOSPITAL_COMMUNITY): Payer: Self-pay

## 2021-06-23 DIAGNOSIS — N3000 Acute cystitis without hematuria: Secondary | ICD-10-CM | POA: Diagnosis not present

## 2021-06-23 MED ORDER — CEPHALEXIN 500 MG PO CAPS
500.0000 mg | ORAL_CAPSULE | Freq: Two times a day (BID) | ORAL | 0 refills | Status: AC
  Filled 2021-06-23: qty 14, 7d supply, fill #0

## 2021-06-23 NOTE — Progress Notes (Signed)
E-Visit for Urinary Problems ? ?We are sorry that you are not feeling well.  Here is how we plan to help! ? ?Based on what you shared with me it looks like you most likely have a simple urinary tract infection. ? ?A UTI (Urinary Tract Infection) is a bacterial infection of the bladder. ? ?Most cases of urinary tract infections are simple to treat but a key part of your care is to encourage you to drink plenty of fluids and watch your symptoms carefully. ? ?I have prescribed Keflex 500 mg twice a day for 7 days.  Your symptoms should gradually improve. Call us if the burning in your urine worsens, you develop worsening fever, back pain or pelvic pain or if your symptoms do not resolve after completing the antibiotic. ? ?Urinary tract infections can be prevented by drinking plenty of water to keep your body hydrated.  Also be sure when you wipe, wipe from front to back and don't hold it in!  If possible, empty your bladder every 4 hours. ? ?HOME CARE ?Drink plenty of fluids ?Compete the full course of the antibiotics even if the symptoms resolve ?Remember, when you need to go?go. Holding in your urine can increase the likelihood of getting a UTI! ?GET HELP RIGHT AWAY IF: ?You cannot urinate ?You get a high fever ?Worsening back pain occurs ?You see blood in your urine ?You feel sick to your stomach or throw up ?You feel like you are going to pass out ? ?MAKE SURE YOU  ?Understand these instructions. ?Will watch your condition. ?Will get help right away if you are not doing well or get worse. ? ? ?Thank you for choosing an e-visit. ? ?Your e-visit answers were reviewed by a board certified advanced clinical practitioner to complete your personal care plan. Depending upon the condition, your plan could have included both over the counter or prescription medications. ? ?Please review your pharmacy choice. Make sure the pharmacy is open so you can pick up prescription now. If there is a problem, you may contact your  provider through MyChart messaging and have the prescription routed to another pharmacy.  Your safety is important to us. If you have drug allergies check your prescription carefully.  ? ?For the next 24 hours you can use MyChart to ask questions about today's visit, request a non-urgent call back, or ask for a work or school excuse. ?You will get an email in the next two days asking about your experience. I hope that your e-visit has been valuable and will speed your recovery. ? ?I have spent 5 minutes in review of e-visit questionnaire, review and updating patient chart, medical decision making and response to patient.  ? ?Calandra Madura, PhD, FNP-BC ?  ?

## 2021-06-24 ENCOUNTER — Other Ambulatory Visit: Payer: Self-pay

## 2021-06-24 ENCOUNTER — Ambulatory Visit (INDEPENDENT_AMBULATORY_CARE_PROVIDER_SITE_OTHER): Payer: 59 | Admitting: Podiatry

## 2021-06-24 ENCOUNTER — Ambulatory Visit (INDEPENDENT_AMBULATORY_CARE_PROVIDER_SITE_OTHER): Payer: 59

## 2021-06-24 DIAGNOSIS — M79672 Pain in left foot: Secondary | ICD-10-CM

## 2021-06-24 DIAGNOSIS — M6740 Ganglion, unspecified site: Secondary | ICD-10-CM

## 2021-06-24 NOTE — Progress Notes (Signed)
G left

## 2021-06-24 NOTE — Patient Instructions (Signed)
Ganglion Cyst Drainage Ganglion cyst drainage is a procedure to drain a fluid-filled sac that forms near joints or on tendons or ligaments near joints (ganglion cyst). These cysts are filled with a clear fluid. They most often develop in the hand or wrist, but they can also develop in the shoulder, elbow, hip, knee, ankle, or foot. In this procedure, the fluid is drained from the cyst using a needle and syringe. This is called aspiration. You may need aspiration if: The cyst is large and unsightly. The cyst is painful or limits movement. The cyst is pushing on a nerve and causing numbness, tingling, or weakness. Aspiration may be done several times. If the cyst keeps coming back after aspiration, you may need surgery to remove the cyst. Tell a health care provider about: Any allergies you have. All medicines you are taking, including vitamins, herbs, eye drops, creams, and over-the-counter medicines. Any problems you or family members have had with anesthetic medicines. Any blood disorders you have. Any surgeries you have had. Any medical conditions you have. Whether you are pregnant or may be pregnant. What are the risks? Generally, this is a safe procedure. However, problems may occur, including: The cyst coming back again (recurrence). This is the most common risk. Stiffness or limited movement. Damage to nearby structures, such as tendons, nerves, or blood vessels. Infection. Bleeding. Allergic reactions to medicines. What happens before the procedure? You may have an imaging test, such as an X-ray, MRI, CT scan, or ultrasound. Ask your health care provider about: Changing or stopping your regular medicines. This is especially important if you are taking diabetes medicines or blood thinners. Taking medicines such as aspirin and ibuprofen. These medicines can thin your blood. Do not take these medicines unless your health care provider tells you to take them. Taking over-the-counter  medicines, vitamins, herbs, and supplements. Ask your health care provider if you should: Plan to have someone take you home from the hospital or clinic. Plan to have someone with you for 24 hours. Ask your health care provider what steps will be taken to help prevent infection. These steps may include washing skin with a germ-killing soap. What happens during the procedure? An IV may be inserted into one of your veins. You may be given: A medicine to help you relax (sedative). A medicine to numb the area (local anesthetic). This will be injected around the cyst. When the area is numb, a needle with a syringe will be inserted into the cyst. If the cyst is near a main blood vessel (artery), your health care provider may do an ultrasound to help avoid the artery while placing the needle. Fluid from the cyst will be drawn into the syringe to shrink the size of the cyst. In some cases, pressure may be applied to the cyst to force remaining fluid out of the cyst into the surrounding tissue under the skin. Medicine may be injected into the cyst to decrease inflammation. This medicine may be corticosteroids, ethanol, or hyaluronidase. The needle will be removed, and a small bandage (dressing) will be placed over the puncture site. The procedure may vary among health care providers and hospitals. What can I expect after the procedure? You will be able to go home right after the procedure. If you were given a sedative during the procedure, it can affect you for several hours. Do not drive or operate machinery until your health care provider says that it is safe. You may have to wear a splint or brace.  After the procedure, it is common to have some soreness, swelling, and stiffness in the affected area. Follow these instructions at home: If you have a splint or brace: Wear it as told by your health care provider. Remove it only as told by your health care provider. You may need to wear the splint or  brace for several days. Loosen the splint or brace if your fingers (or toes) tingle, become numb, or turn cold and blue. Keep the splint or brace clean. If the splint or brace is not waterproof: Do not let it get wet. Ask if you can remove it for bathing. If not, cover it with a watertight covering when you take a bath or shower. Managing pain, stiffness, and swelling  If directed, put ice on the puncture area. To do this: If you have a removable splint or brace, remove it as told by your health care provider. Put ice in a plastic bag. Place a towel between your skin and the bag or between the splint or brace and the bag. Leave the ice on for 20 minutes, 2-3 times a day. Move your affected joint or extremity often to reduce stiffness and swelling. Raise (elevate) the puncture area above the level of your heart while you are sitting or lying down. Puncture site care  Remove your bandage when your health care provider says you can. Check your puncture area every day for signs of infection. Check for: Redness, swelling, or pain. Fluid or blood. Warmth. Pus or a bad smell. Do not take baths, swim, or use a hot tub until your health care provider approves. Activity Return to your normal activities as told by your health care provider. Ask your health care provider what activities are safe for you. Avoid activities that cause pain. Ask your health care provider when it is safe to drive and when you should start doing movement exercises. General instructions Take over-the-counter and prescription medicines only as told by your health care provider. Do not use any products that contain nicotine or tobacco, such as cigarettes, e-cigarettes, and chewing tobacco. These can delay healing. If you need help quitting, ask your health care provider. Keep all follow-up visits as told by your health care provider. This is important. Contact a health care provider if: Medicine is not controlling your  pain. You have stiffness or swelling that gets worse. Your splint or brace is not fitting correctly, causing your fingers (or toes) to tingle, become numb, or turn cold and blue. You have any signs of infection at your puncture site. You have a fever. Summary A ganglion cyst is a fluid-filled sac that forms near joints or on tendons or ligaments near joints. Ganglion cysts most often develop in the hand or wrist, but they can also develop in the shoulder, elbow, hip, knee, ankle, or foot. In ganglion cyst drainage, a needle is placed into the cyst to drain it into a syringe (aspiration). Sometimes ganglion cysts come back after drainage. Aspiration may be done several times. If the cyst continues to come back after drainage, you may need surgery to remove the cyst. This information is not intended to replace advice given to you by your health care provider. Make sure you discuss any questions you have with your health care provider. Document Revised: 08/04/2019 Document Reviewed: 08/04/2019 Elsevier Patient Education  [place].

## 2021-06-29 NOTE — Progress Notes (Signed)
Subjective:   Patient ID: Glade Nurse, female   DOB: 41 y.o.   MRN: [id number]   HPI 40 year old female presents the office today for concerns of a ganglion cyst on the dorsal aspect of the left foot.  She said that she is currently training for 28 mile hike in June.  This started about 1 month ago and no recent injury.  She is tender with wearing shoes it rubs.  Describes moderate aching sensation.  She is asking about draining it at this time.  She has no other concerns today.  No recent treatment.   Review of Systems  All other systems reviewed and are negative.  Past Medical History:  Diagnosis Date   ALLERGIC RHINITIS    ANXIETY    hx - no meds   DEPRESSION    hx - no meds   Family history of breast cancer    Family history of colon cancer    Family history of colonic polyps 07/27/2017   Family history of lung cancer    Family history of pancreatic cancer    HEPATIC CYST 1988   hx of   History of COVID-19 09/2020   mild symptoms x few days all symptoms resolved except occ feels like lump in throat pt pcp aware, no trouble swallowing per pt   History of UTI    last uti 02-21-2021, no current uti symptoms   Migraines    during pregnancy hormone related, none recent per pt on 04-01-2021   Obesity    PCOS (polycystic ovarian syndrome)    Seasonal allergies    Wears glasses 04/01/2021   or contacts    Past Surgical History:  Procedure Laterality Date   Benign liver cyst removed  [place]  09/2008   CESAREAN SECTION  02/26/2012   Procedure: CESAREAN SECTION;  Surgeon: Claiborne Billings A. Pamala Hurry, MD;  Location: [facility];  Service: Obstetrics;  Laterality: N/A;  EDD: 02/27/12   CESAREAN SECTION WITH BILATERAL TUBAL LIGATION Bilateral 01/30/2014   Procedure: Repeat CESAREAN SECTION WITH TUBAL LIGATION;  Surgeon: Claiborne Billings A. Pamala Hurry, MD;  Location: [facility];  Service: Obstetrics;  Laterality: Bilateral;  EDD: 02/03/14   CYST EXCISION PERINEAL N/A 04/04/2021   Procedure:  EXCISION OF PERINEAL CYST;  Surgeon: Clovis Riley, MD;  Location: [facility];  Service: General;  Laterality: N/A;   KNEE ARTHROSCOPY [place] N/A 01/30/2014   Procedure: SCAR REVISION;  Surgeon: Claiborne Billings A. Pamala Hurry, MD;  Location: [facility];  Service: Obstetrics;  Laterality: N/A;   tubal ligation  2015   wisdom teeth extraction  2004     Current Outpatient Medications:    cephALEXin (KEFLEX) 500 MG capsule, Take 1 capsule (500 mg total) by mouth 2 (two) times daily for 7 days., Disp: 14 capsule, Rfl: 0   COVID-19 At Home Antigen Test [facility] COVID-19 HOME TEST) KIT, Use as directed, Disp: 4 each, Rfl: 0   ibuprofen (ADVIL) 400 MG tablet, Take 400 mg by mouth every 6 (six) hours as needed., Disp: , Rfl:    loratadine (CLARITIN) 10 MG tablet, Take 10 mg by mouth daily as needed for allergies., Disp: , Rfl:    metFORMIN (GLUCOPHAGE-XR) 500 MG 24 hr tablet, Take 2 tablets (1,000 mg total) by mouth 2 (two) times daily. (Patient taking differently: Take 1,000 mg by mouth 2 (two) times daily. For pocs), Disp: 360 tablet, Rfl: 1   omeprazole (PRILOSEC) 20 MG capsule, Take 1  capsule (20 mg total) by mouth daily., Disp: 30 capsule, Rfl: 3   Semaglutide-Weight Management (WEGOVY) 0.25 MG/0.5ML SOAJ, Inject 0.25 mg into the skin once a week., Disp: 2 mL, Rfl: 1   sertraline (ZOLOFT) 100 MG tablet, Take 1 tablet (100 mg total) by mouth daily., Disp: 90 tablet, Rfl: 1   spironolactone (ALDACTONE) 50 MG tablet, Take 1 tablet (50 mg total) by mouth 2 (two) times daily., Disp: 180 tablet, Rfl: 0  No Known Allergies        Objective:  Physical Exam  General: AAO x3, NAD  Dermatological: Skin is warm, dry and supple bilateral. There are no open sores, no preulcerative lesions, no rash or signs of infection present.  Vascular: Dorsalis Pedis artery and Posterior Tibial artery pedal pulses are 2/4 bilateral with immedate capillary fill time.  There is no pain with  calf compression, swelling, warmth, erythema.   Neruologic: Grossly intact via light touch bilateral.  Musculoskeletal: On the dorsal aspect of the left midfoot is a fluid-filled mobile soft tissue mass consistent with a ganglion cyst.  See procedure note below but upon aspiration was clear, jellylike fluid consistent with a ganglion cyst.  Muscular strength 5/5 in all groups tested bilateral.  Gait: Unassisted, Nonantalgic.       Assessment:   Ganglion cyst left foot     Plan:  -Treatment options discussed including all alternatives, risks, and complications -Etiology of symptoms were discussed -X-rays were obtained and reviewed with the patient.  No evidence of acute fracture, calcifications. -We discussed both conservative as well as conservative treatment.  She wants to have the area drained now and if it comes back we will consider removal later on.  We discussed risks of an aspiration.  She wishes to proceed and verbal consent was obtained.  I prepped the skin with alcohol and 3 cc of lidocaine, Marcaine plain was infiltrated in regional block fashion.  I then prepped the skin with Betadine, alcohol.  I was able to aspirate clear, jellylike fluid consistent with a ganglion cyst.  She tolerated aspiration well.  Compression bandage was applied. -Discussed releasing her shoes to put any irritation.  (We did take a video of the aspiration for social media purposes and the patient gave consent for this)   Return if symptoms worsen or fail to improve.  Trula Slade DPM

## 2021-07-10 ENCOUNTER — Telehealth: Payer: Self-pay | Admitting: Internal Medicine

## 2021-07-10 NOTE — Telephone Encounter (Signed)
Spoke with pt. Let pt know that since her last office visit was 2021 we would need to get her scheduled for an office visit to further evaluate her symptoms. Pt wanted to know if Dr. Henrene Pastor wants her to go ahead and get the colonoscopy first that was previously ordered and then have an office visit after. Let pt know that I would ask Dr. Henrene Pastor and then give her a call back.

## 2021-07-10 NOTE — Telephone Encounter (Signed)
"  I have been experiencing a fullness/swelling in the left upper quadrant of my abdomen, under my left ribs,  for about a month. It is more apparent after eating. Also experiencing feeling of globus in my throat. PCP ordered full abdominal ultrasound that was unremarkable except for signs of fatty liver. She also ordered Omeprazole. The feelings are still present and so Im concerned about other causes. Need to schedule colonoscopy, as I have family history of colon CA. Should I do that first?"

## 2021-07-10 NOTE — Telephone Encounter (Signed)
Left message for pt to call back  °

## 2021-07-11 NOTE — Telephone Encounter (Signed)
Called pt and scheduled EGD and colonoscopy on 08/26/21 at 2 pm. Pre visit scheduled for 08/05/21 at 3 pm. Pt verbalized understanding and had no other concerns at end of call.

## 2021-07-11 NOTE — Telephone Encounter (Signed)
Yes, I saw the patient previously for colon cancer screening.  I recommended colonoscopy.  She did not call back to schedule. Please schedule upper endoscopy and colonoscopy in the [place].  Tell her to continue on omeprazole.  Upper endoscopy to evaluate the globus complaints and upper abdominal pain.  Colonoscopy for family history of colon cancer.  Thanks

## 2021-07-11 NOTE — Telephone Encounter (Signed)
Left message for pt to call back  °

## 2021-07-11 NOTE — Telephone Encounter (Signed)
Patient returned your call, please advise. 

## 2021-07-22 ENCOUNTER — Encounter: Payer: Self-pay | Admitting: Family Medicine

## 2021-07-22 ENCOUNTER — Telehealth: Payer: 59 | Admitting: Physician Assistant

## 2021-07-22 ENCOUNTER — Other Ambulatory Visit (HOSPITAL_COMMUNITY): Payer: Self-pay

## 2021-07-22 DIAGNOSIS — R3989 Other symptoms and signs involving the genitourinary system: Secondary | ICD-10-CM | POA: Diagnosis not present

## 2021-07-22 MED ORDER — CEPHALEXIN 500 MG PO CAPS
500.0000 mg | ORAL_CAPSULE | Freq: Two times a day (BID) | ORAL | 0 refills | Status: AC
  Filled 2021-07-22: qty 14, 7d supply, fill #0

## 2021-07-22 NOTE — Progress Notes (Signed)
I have spent 5 minutes in review of e-visit questionnaire, review and updating patient chart, medical decision making and response to patient.   Euleta Belson Cody Samiksha Pellicano, PA-C    

## 2021-07-22 NOTE — Progress Notes (Signed)
E-Visit for Urinary Problems  We are sorry that you are not feeling well.  Here is how we plan to help!  Based on what you shared with me it looks like you most likely have a simple urinary tract infection.  A UTI (Urinary Tract Infection) is a bacterial infection of the bladder.  Most cases of urinary tract infections are simple to treat but a key part of your care is to encourage you to drink plenty of fluids and watch your symptoms carefully.  I have prescribed Keflex 500 mg twice a day for 7 days.  Your symptoms should gradually improve. Call us if the burning in your urine worsens, you develop worsening fever, back pain or pelvic pain or if your symptoms do not resolve after completing the antibiotic.  Giving this is the second UTI in the past 4-6 weeks. You will need a follow-up with your primary care if there is any recurrence of UTI symptoms in the next few months as you will need a urinalysis and culture, as well as further discussion of possible preventative measures.  Urinary tract infections can be prevented by drinking plenty of water to keep your body hydrated.  Also be sure when you wipe, wipe from front to back and don't hold it in!  If possible, empty your bladder every 4 hours.  HOME CARE Drink plenty of fluids Compete the full course of the antibiotics even if the symptoms resolve Remember, when you need to gogo. Holding in your urine can increase the likelihood of getting a UTI! GET HELP RIGHT AWAY IF: You cannot urinate You get a high fever Worsening back pain occurs You see blood in your urine You feel sick to your stomach or throw up You feel like you are going to pass out  MAKE SURE YOU  Understand these instructions. Will watch your condition. Will get help right away if you are not doing well or get worse.   Thank you for choosing an e-visit.  Your e-visit answers were reviewed by a board certified advanced clinical practitioner to complete your personal  care plan. Depending upon the condition, your plan could have included both over the counter or prescription medications.  Please review your pharmacy choice. Make sure the pharmacy is open so you can pick up prescription now. If there is a problem, you may contact your provider through [employer] and have the prescription routed to another pharmacy.  Your safety is important to [place]. If you have drug allergies check your prescription carefully.   For the next 24 hours you can use MyChart to ask questions about today's visit, request a non-urgent call back, or ask for a work or school excuse. You will get an email in the next two days asking about your experience. I hope that your e-visit has been valuable and will speed your recovery.

## 2021-07-23 ENCOUNTER — Other Ambulatory Visit (HOSPITAL_COMMUNITY): Payer: Self-pay

## 2021-07-23 MED ORDER — SERTRALINE HCL 25 MG PO TABS
25.0000 mg | ORAL_TABLET | Freq: Every day | ORAL | 3 refills | Status: DC
Start: 2021-07-23 — End: 2022-02-05
  Filled 2021-07-23: qty 30, 30d supply, fill #0
  Filled 2021-09-11: qty 30, 30d supply, fill #1
  Filled 2021-10-29: qty 30, 30d supply, fill #2
  Filled 2021-12-25 – 2022-01-05 (×2): qty 30, 30d supply, fill #3

## 2021-08-05 ENCOUNTER — Other Ambulatory Visit: Payer: Self-pay

## 2021-08-05 ENCOUNTER — Other Ambulatory Visit (HOSPITAL_COMMUNITY): Payer: Self-pay

## 2021-08-05 ENCOUNTER — Ambulatory Visit (AMBULATORY_SURGERY_CENTER): Payer: 59 | Admitting: *Deleted

## 2021-08-05 VITALS — Ht 65.0 in | Wt 200.0 lb

## 2021-08-05 DIAGNOSIS — R0989 Other specified symptoms and signs involving the circulatory and respiratory systems: Secondary | ICD-10-CM

## 2021-08-05 DIAGNOSIS — Z800 Family history of malignant neoplasm of digestive organs: Secondary | ICD-10-CM

## 2021-08-05 DIAGNOSIS — R1010 Upper abdominal pain, unspecified: Secondary | ICD-10-CM

## 2021-08-05 MED ORDER — NA SULFATE-K SULFATE-MG SULF 17.5-3.13-1.6 GM/177ML PO SOLN
1.0000 | Freq: Once | ORAL | 0 refills | Status: AC
  Filled 2021-08-05: qty 354, 1d supply, fill #0

## 2021-08-05 NOTE — Progress Notes (Signed)
No egg or soy allergy known to patient  ?No issues known to pt with past sedation with any surgeries or procedures ?Patient denies ever being told they had issues or difficulty with intubation  ?No FH of Malignant Hyperthermia ?Pt is not on diet pills ?Pt is not on  home 02  ?Pt is not on blood thinners  ?Pt denies issues with constipation  ?No A fib or A flutter ? ?Pt is fully vaccinated  for Covid  ? ? NO PA's for preps discussed with pt In PV today  ?Discussed with pt there will be an out-of-pocket cost for prep and that varies from $0 to 70 +  dollars - pt verbalized understanding  ? ?Due to the COVID-19 pandemic we are asking patients to follow certain guidelines in PV and the [facility]   ?Pt aware of COVID protocols and [facility] guidelines  ? ?PV completed over the phone. Pt verified name, DOB, address and insurance during PV today.  ?Pt mailed instruction packet with copy of consent form to read and not return, and instructions.  ?Pt encouraged to call with questions or issues.  ?If pt has My chart, procedure instructions sent via My Chart  ?Pt RS colon egd to 09-24-21 WED  ?

## 2021-08-26 ENCOUNTER — Encounter: Payer: 59 | Admitting: Internal Medicine

## 2021-09-06 DIAGNOSIS — Z0289 Encounter for other administrative examinations: Secondary | ICD-10-CM

## 2021-09-10 ENCOUNTER — Encounter (INDEPENDENT_AMBULATORY_CARE_PROVIDER_SITE_OTHER): Payer: Self-pay | Admitting: Family Medicine

## 2021-09-10 ENCOUNTER — Ambulatory Visit (INDEPENDENT_AMBULATORY_CARE_PROVIDER_SITE_OTHER): Payer: 59 | Admitting: Family Medicine

## 2021-09-10 VITALS — BP 149/93 | HR 63 | Temp 98.0°F | Ht 65.0 in | Wt 215.0 lb

## 2021-09-10 DIAGNOSIS — E66812 Obesity, class 2: Secondary | ICD-10-CM

## 2021-09-10 DIAGNOSIS — Z6835 Body mass index (BMI) 35.0-35.9, adult: Secondary | ICD-10-CM

## 2021-09-10 DIAGNOSIS — F39 Unspecified mood [affective] disorder: Secondary | ICD-10-CM

## 2021-09-10 DIAGNOSIS — R5383 Other fatigue: Secondary | ICD-10-CM | POA: Diagnosis not present

## 2021-09-10 DIAGNOSIS — K219 Gastro-esophageal reflux disease without esophagitis: Secondary | ICD-10-CM

## 2021-09-10 DIAGNOSIS — R0602 Shortness of breath: Secondary | ICD-10-CM | POA: Diagnosis not present

## 2021-09-10 DIAGNOSIS — E668 Other obesity: Secondary | ICD-10-CM

## 2021-09-10 DIAGNOSIS — E282 Polycystic ovarian syndrome: Secondary | ICD-10-CM | POA: Diagnosis not present

## 2021-09-10 DIAGNOSIS — Z1331 Encounter for screening for depression: Secondary | ICD-10-CM | POA: Diagnosis not present

## 2021-09-10 DIAGNOSIS — K760 Fatty (change of) liver, not elsewhere classified: Secondary | ICD-10-CM | POA: Diagnosis not present

## 2021-09-10 DIAGNOSIS — Z9189 Other specified personal risk factors, not elsewhere classified: Secondary | ICD-10-CM

## 2021-09-11 ENCOUNTER — Other Ambulatory Visit (HOSPITAL_COMMUNITY): Payer: Self-pay

## 2021-09-11 LAB — VITAMIN B12: Vitamin B-12: 538 pg/mL (ref 232–1245)

## 2021-09-11 LAB — CBC WITH DIFFERENTIAL/PLATELET
Basophils Absolute: 0 10*3/uL (ref 0.0–0.2)
Basos: 0 %
EOS (ABSOLUTE): 0.1 10*3/uL (ref 0.0–0.4)
Eos: 1 %
Hematocrit: 42.3 % (ref 34.0–46.6)
Hemoglobin: 14.5 g/dL (ref 11.1–15.9)
Immature Grans (Abs): 0 10*3/uL (ref 0.0–0.1)
Immature Granulocytes: 0 %
Lymphocytes Absolute: 2 10*3/uL (ref 0.7–3.1)
Lymphs: 28 %
MCH: 30.8 pg (ref 26.6–33.0)
MCHC: 34.3 g/dL (ref 31.5–35.7)
MCV: 90 fL (ref 79–97)
Monocytes Absolute: 0.6 10*3/uL (ref 0.1–0.9)
Monocytes: 8 %
Neutrophils Absolute: 4.3 10*3/uL (ref 1.4–7.0)
Neutrophils: 63 %
Platelets: 250 10*3/uL (ref 150–450)
RBC: 4.71 x10E6/uL (ref 3.77–5.28)
RDW: 12.3 % (ref 11.7–15.4)
WBC: 7 10*3/uL (ref 3.4–10.8)

## 2021-09-11 LAB — LIPID PANEL WITH LDL/HDL RATIO
Cholesterol, Total: 245 mg/dL — ABNORMAL HIGH (ref 100–199)
HDL: 70 mg/dL (ref >39)
LDL Chol Calc (NIH): 145 mg/dL — ABNORMAL HIGH (ref 0–99)
LDL/HDL Ratio: 2.1 ratio (ref 0.0–3.2)
Triglycerides: 167 mg/dL — ABNORMAL HIGH (ref 0–149)
VLDL Cholesterol Cal: 30 mg/dL (ref 5–40)

## 2021-09-11 LAB — COMPREHENSIVE METABOLIC PANEL
ALT: 42 IU/L — ABNORMAL HIGH (ref 0–32)
AST: 33 IU/L (ref 0–40)
Albumin/Globulin Ratio: 1.9 (ref 1.2–2.2)
Albumin: 4.8 g/dL (ref 3.8–4.8)
Alkaline Phosphatase: 130 IU/L — ABNORMAL HIGH (ref 44–121)
BUN/Creatinine Ratio: 14 (ref 9–23)
BUN: 12 mg/dL (ref 6–24)
Bilirubin Total: 0.5 mg/dL (ref 0.0–1.2)
CO2: 22 mmol/L (ref 20–29)
Calcium: 9.7 mg/dL (ref 8.7–10.2)
Chloride: 100 mmol/L (ref 96–106)
Creatinine, Ser: 0.84 mg/dL (ref 0.57–1.00)
Globulin, Total: 2.5 g/dL (ref 1.5–4.5)
Glucose: 79 mg/dL (ref 70–99)
Potassium: 4.3 mmol/L (ref 3.5–5.2)
Sodium: 139 mmol/L (ref 134–144)
Total Protein: 7.3 g/dL (ref 6.0–8.5)
eGFR: 89 mL/min/{1.73_m2} (ref >59)

## 2021-09-11 LAB — HEMOGLOBIN A1C
Est. average glucose Bld gHb Est-mCnc: 100 mg/dL
Hgb A1c MFr Bld: 5.1 % (ref 4.8–5.6)

## 2021-09-11 LAB — TSH: TSH: 1.29 u[IU]/mL (ref 0.450–4.500)

## 2021-09-11 LAB — INSULIN, RANDOM: INSULIN: 7.5 u[IU]/mL (ref 2.6–24.9)

## 2021-09-11 LAB — VITAMIN D 25 HYDROXY (VIT D DEFICIENCY, FRACTURES): Vit D, 25-Hydroxy: 22.9 ng/mL — ABNORMAL LOW (ref 30.0–100.0)

## 2021-09-11 LAB — FOLATE: Folate: 8 ng/mL (ref >3.0)

## 2021-09-11 LAB — T4: T4, Total: 9.2 ug/dL (ref 4.5–12.0)

## 2021-09-15 ENCOUNTER — Encounter: Payer: Self-pay | Admitting: Internal Medicine

## 2021-09-17 ENCOUNTER — Encounter: Payer: Self-pay | Admitting: Internal Medicine

## 2021-09-17 ENCOUNTER — Other Ambulatory Visit: Payer: Self-pay

## 2021-09-17 ENCOUNTER — Other Ambulatory Visit (HOSPITAL_COMMUNITY): Payer: Self-pay

## 2021-09-17 MED ORDER — NA SULFATE-K SULFATE-MG SULF 17.5-3.13-1.6 GM/177ML PO SOLN
ORAL | 0 refills | Status: DC
  Filled 2021-09-17: qty 354, 1d supply, fill #0

## 2021-09-18 ENCOUNTER — Encounter: Payer: 59 | Admitting: Internal Medicine

## 2021-09-24 ENCOUNTER — Ambulatory Visit (AMBULATORY_SURGERY_CENTER): Payer: 59 | Admitting: Internal Medicine

## 2021-09-24 ENCOUNTER — Encounter: Payer: Self-pay | Admitting: Internal Medicine

## 2021-09-24 VITALS — BP 122/66 | HR 59 | Temp 97.7°F | Resp 22 | Ht 65.0 in | Wt 200.0 lb

## 2021-09-24 DIAGNOSIS — Z800 Family history of malignant neoplasm of digestive organs: Secondary | ICD-10-CM | POA: Diagnosis not present

## 2021-09-24 DIAGNOSIS — F32A Depression, unspecified: Secondary | ICD-10-CM | POA: Diagnosis not present

## 2021-09-24 DIAGNOSIS — R1010 Upper abdominal pain, unspecified: Secondary | ICD-10-CM

## 2021-09-24 DIAGNOSIS — K635 Polyp of colon: Secondary | ICD-10-CM | POA: Diagnosis not present

## 2021-09-24 DIAGNOSIS — R0989 Other specified symptoms and signs involving the circulatory and respiratory systems: Secondary | ICD-10-CM

## 2021-09-24 DIAGNOSIS — D123 Benign neoplasm of transverse colon: Secondary | ICD-10-CM | POA: Diagnosis not present

## 2021-09-24 DIAGNOSIS — E669 Obesity, unspecified: Secondary | ICD-10-CM | POA: Diagnosis not present

## 2021-09-24 DIAGNOSIS — Z1211 Encounter for screening for malignant neoplasm of colon: Secondary | ICD-10-CM | POA: Diagnosis not present

## 2021-09-24 DIAGNOSIS — F419 Anxiety disorder, unspecified: Secondary | ICD-10-CM | POA: Diagnosis not present

## 2021-09-24 HISTORY — PX: COLONOSCOPY WITH ESOPHAGOGASTRODUODENOSCOPY (EGD): SHX5779

## 2021-09-24 MED ORDER — SODIUM CHLORIDE 0.9 % IV SOLN: 500.0000 mL | Freq: Once | INTRAVENOUS | Status: DC

## 2021-09-24 NOTE — Op Note (Addendum)
[facility] ?Patient Name: Jamie Nelson ?Procedure Date: 09/24/2021 2:17 PM ?MRN: [id number] ?Endoscopist: Docia Chuck. Henrene Pastor , MD ?Age: 41 ?Referring MD:  ?Date of Birth: 1980-06-30 ?Gender: Female ?Account #: [id number] ?Procedure:                Colonoscopy with cold snare polypectomy x 1 ?Indications:              Screening in patient at increased risk: Colorectal  ?                          cancer in father before age 47 ?Medicines:                Monitored Anesthesia Care ?Procedure:                Pre-Anesthesia Assessment: ?                          - Prior to the procedure, a History and Physical  ?                          was performed, and patient medications and  ?                          allergies were reviewed. The patient's tolerance of  ?                          previous anesthesia was also reviewed. The risks  ?                          and benefits of the procedure and the sedation  ?                          options and risks were discussed with the patient.  ?                          All questions were answered, and informed consent  ?                          was obtained. Prior Anticoagulants: The patient has  ?                          taken no previous anticoagulant or antiplatelet  ?                          agents. ASA Grade Assessment: II - A patient with  ?                          mild systemic disease. After reviewing the risks  ?                          and benefits, the patient was deemed in  ?                          satisfactory condition to undergo the procedure. ?  After obtaining informed consent, the colonoscope  ?                          was passed under direct vision. Throughout the  ?                          procedure, the patient's blood pressure, pulse, and  ?                          oxygen saturations were monitored continuously. The  ?                          Olympus CF-HQ190L (#[id number]) Colonoscope was  ?                           introduced through the anus and advanced to the the  ?                          cecum, identified by appendiceal orifice and  ?                          ileocecal valve. The ileocecal valve, appendiceal  ?                          orifice, and rectum were photographed. The quality  ?                          of the bowel preparation was excellent. The  ?                          colonoscopy was performed without difficulty. The  ?                          patient tolerated the procedure well. The bowel  ?                          preparation used was SUPREP via split dose  ?                          instruction. ?Scope In: 2:29:49 PM ?Scope Out: 2:44:21 PM ?Scope Withdrawal Time: 0 hours 11 minutes 36 seconds  ?Total Procedure Duration: 0 hours 14 minutes 32 seconds  ?Findings:                 A 1 mm polyp was found in the transverse colon. The  ?                          polyp was sessile. The polyp was removed with a  ?                          cold snare. Resection and retrieval were complete. ?                          Multiple diverticula were found in the left colon  ?  and right colon. ?                          The exam was otherwise without abnormality on  ?                          direct and retroflexion views. Internal hemorrhoids  ?                          present. ?Complications:            No immediate complications. Estimated blood loss:  ?                          None. ?Estimated Blood Loss:     Estimated blood loss: none. ?Impression:               - One 1 mm polyp in the transverse colon, removed  ?                          with a cold snare. Resected and retrieved. ?                          - Diverticulosis ?                          - The examination was otherwise normal on direct  ?                          and retroflexion views. ?Recommendation:           - Repeat colonoscopy in 5 years for surveillance  ?                          (family history). ?                           - Patient has a contact number available for  ?                          emergencies. The signs and symptoms of potential  ?                          delayed complications were discussed with the  ?                          patient. Return to normal activities tomorrow.  ?                          Written discharge instructions were provided to the  ?                          patient. ?                          - Resume previous diet. ?                          -  Continue present medications. ?                          - Await pathology results. ?Docia Chuck. Henrene Pastor, MD ?09/24/2021 2:49:51 PM ?This report has been signed electronically. ?

## 2021-09-24 NOTE — Progress Notes (Signed)
Sedate, gd SR, tolerated procedure well, VSS, report to RN 

## 2021-09-24 NOTE — Op Note (Signed)
[facility] ?Patient Name: Jamie Nelson ?Procedure Date: 09/24/2021 2:16 PM ?MRN: [id number] ?Endoscopist: Docia Chuck. Henrene Pastor , MD ?Age: 41 ?Referring MD:  ?Date of Birth: 31-May-1980 ?Gender: Female ?Account #: [id number] ?Procedure:                Upper GI endoscopy ?Indications:              Upper abdominal pain, Globus sensation ?Medicines:                Monitored Anesthesia Care ?Procedure:                Pre-Anesthesia Assessment: ?                          - Prior to the procedure, a History and Physical  ?                          was performed, and patient medications and  ?                          allergies were reviewed. The patient's tolerance of  ?                          previous anesthesia was also reviewed. The risks  ?                          and benefits of the procedure and the sedation  ?                          options and risks were discussed with the patient.  ?                          All questions were answered, and informed consent  ?                          was obtained. Prior Anticoagulants: The patient has  ?                          taken no previous anticoagulant or antiplatelet  ?                          agents. ASA Grade Assessment: II - A patient with  ?                          mild systemic disease. After reviewing the risks  ?                          and benefits, the patient was deemed in  ?                          satisfactory condition to undergo the procedure. ?                          After obtaining informed consent, the endoscope was  ?  passed under direct vision. Throughout the  ?                          procedure, the patient's blood pressure, pulse, and  ?                          oxygen saturations were monitored continuously. The  ?                          Endoscope was introduced through the mouth, and  ?                          advanced to the second part of duodenum. The upper  ?                          GI endoscopy was  accomplished without difficulty.  ?                          The patient tolerated the procedure well. ?Scope In: ?Scope Out: ?Findings:                 The esophagus was normal. ?                          The stomach was normal. ?                          The examined duodenum was normal. ?                          The cardia and gastric fundus were normal on  ?                          retroflexion. ?Complications:            No immediate complications. ?Estimated Blood Loss:     Estimated blood loss: none. ?Impression:               - Normal esophagus. ?                          - Normal stomach. ?                          - Normal examined duodenum. ?                          - No specimens collected. ?Recommendation:           - Patient has a contact number available for  ?                          emergencies. The signs and symptoms of potential  ?                          delayed complications were discussed with the  ?  patient. Return to normal activities tomorrow.  ?                          Written discharge instructions were provided to the  ?                          patient. ?                          - Resume previous diet. ?                          - Continue present medications. ?Docia Chuck. Henrene Pastor, MD ?09/24/2021 2:56:59 PM ?This report has been signed electronically. ?

## 2021-09-24 NOTE — Patient Instructions (Signed)
Read all of the handouts given to you by your recovery room nurse. ? ?YOU HAD AN ENDOSCOPIC PROCEDURE TODAY AT [facility]:   Refer to the procedure report that was given to you for any specific questions about what was found during the examination.  If the procedure report does not answer your questions, please call your gastroenterologist to clarify.  If you requested that your care partner not be given the details of your procedure findings, then the procedure report has been included in a sealed envelope for you to review at your convenience later. ? ?YOU SHOULD EXPECT: Some feelings of bloating in the abdomen. Passage of more gas than usual.  Walking can help get rid of the air that was put into your GI tract during the procedure and reduce the bloating. If you had a lower endoscopy (such as a colonoscopy or flexible sigmoidoscopy) you may notice spotting of blood in your stool or on the toilet paper. If you underwent a bowel prep for your procedure, you may not have a normal bowel movement for a few days. ? ?Please Note:  You might notice some irritation and congestion in your nose or some drainage.  This is from the oxygen used during your procedure.  There is no need for concern and it should clear up in a day or so. ? ?SYMPTOMS TO REPORT IMMEDIATELY: ? ?Following lower endoscopy (colonoscopy or flexible sigmoidoscopy): ? Excessive amounts of blood in the stool ? Significant tenderness or worsening of abdominal pains ? Swelling of the abdomen that is new, acute ? Fever of 100?F or higher ? ?Following upper endoscopy (EGD) ? Vomiting of blood or coffee ground material ? New chest pain or pain under the shoulder blades ? Painful or persistently difficult swallowing ? New shortness of breath ? Fever of 100?F or higher ? Black, tarry-looking stools ? ?For urgent or emergent issues, a gastroenterologist can be reached at any hour by calling [phone]. ?Do not use MyChart messaging for urgent  concerns.  ? ? ?DIET:  We do recommend a small meal at first, but then you may proceed to your regular diet.  Drink plenty of fluids but you should avoid alcoholic beverages for 24 hours. ? ?ACTIVITY:  You should plan to take it easy for the rest of today and you should NOT DRIVE or use heavy machinery until tomorrow (because of the sedation medicines used during the test).   ? ?FOLLOW UP: ?Our staff will call the number listed on your records 48-72 hours following your procedure to check on you and address any questions or concerns that you may have regarding the information given to you following your procedure. If we do not reach you, we will leave a message.  We will attempt to reach you two times.  During this call, we will ask if you have developed any symptoms of COVID 19. If you develop any symptoms (ie: fever, flu-like symptoms, shortness of breath, cough etc.) before then, please call [phone].  If you test positive for Covid 19 in the 2 weeks post procedure, please call and report this information to [place].   ? ?If any biopsies were taken you will be contacted by phone or by letter within the next 1-3 weeks.  Please call us at [phone] if you have not heard about the biopsies in 3 weeks.  ? ? ?SIGNATURES/CONFIDENTIALITY: ?You and/or your care partner have signed paperwork which will be entered into your electronic medical record.  These signatures attest to the fact that that the information above on your After Visit Summary has been reviewed and is understood.  Full responsibility of the confidentiality of this discharge information lies with you and/or your care-partner.  ?

## 2021-09-24 NOTE — Progress Notes (Signed)
Left side of lip slightly swollen.   Osvaldo Angst CRNA explained to the patient that it's probably from the bite block.  Patient states okay with that. ?

## 2021-09-24 NOTE — Progress Notes (Signed)
HISTORY OF PRESENT ILLNESS: ? ?Jamie Nelson is a 41 y.o. female who presents today for high rescreening colonoscopy (father with colon cancer age 89) as well as upper endoscopy to evaluate complaints of upper abdominal discomfort and globus sensation. ? ?REVIEW OF SYSTEMS: ? ?All non-GI ROS negative. ?Past Medical History:  ?Diagnosis Date  ? Abdominal discomfort   ? Alcohol abuse   ? ALLERGIC RHINITIS   ? Allergy   ? ANXIETY   ? hx -  ? Back pain   ? DEPRESSION   ? hx -  ? Family history of breast cancer   ? Family history of colon cancer   ? father  dx'd age 25  ? Family history of colonic polyps 07/27/2017  ? Family history of lung cancer   ? mat GM, smoker  ? Family history of pancreatic cancer   ? mat great Grandfather  ? Fatty liver   ? GERD (gastroesophageal reflux disease)   ? on omeprazole  ? Gestational edema   ? Gestational hypertension   ? Glaucoma   ? 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 HEPATIC CYST 1988  ? hx of  ? History of COVID-19 09/2020  ? mild symptoms x few days all symptoms resolved except occ feels like lump in throat pt pcp aware, no trouble swallowing per pt  ? History of UTI   ? last uti 02-21-2021, no current uti symptoms  ? Lactose intolerance   ? Migraines   ? during pregnancy hormone related, none recent per pt on 04-01-2021  ? Obesity   ? PCOS (polycystic ovarian syndrome)   ? Seasonal allergies   ? Vitamin D deficiency   ? Wears glasses 04/01/2021  ? or contacts  ? ? ?Past Surgical History:  ?Procedure Laterality Date  ? Benign liver cyst removed  1988  ? CESAREAN SECTION  09/2008  ? CESAREAN SECTION  02/26/2012  ? Procedure: CESAREAN SECTION;  Surgeon: Floyce Stakes. Pamala Hurry, MD;  Location: [facility];  Service: Obstetrics;  Laterality: N/A;  EDD: 02/27/12  ? CESAREAN SECTION WITH BILATERAL TUBAL LIGATION Bilateral 01/30/2014  ? Procedure: Repeat CESAREAN SECTION WITH TUBAL LIGATION;  Surgeon: Claiborne Billings A. Pamala Hurry, MD;  Location: [facility];  Service: Obstetrics;   Laterality: Bilateral;  EDD: 02/03/14  ? CYST EXCISION PERINEAL N/A 04/04/2021  ? Procedure: EXCISION OF PERINEAL CYST;  Surgeon: Clovis Riley, MD;  Location: [facility];  Service: General;  Laterality: N/A;  ? KNEE ARTHROSCOPY Left 1998  ? SCAR REVISION N/A 01/30/2014  ? Procedure: SCAR REVISION;  Surgeon: Claiborne Billings A. Pamala Hurry, MD;  Location: [facility];  Service: Obstetrics;  Laterality: N/A;  ? tubal ligation  2015  ? wisdom teeth extraction  2004  ? ? ?Social History ?Jamie Nelson  reports that she has never smoked. She has never used smokeless tobacco. She reports current alcohol use. She reports that she does not use drugs. ? ?family history includes Alcoholism in her father; Breast cancer (age of onset: 33) in her maternal aunt; Colon cancer in her paternal uncle and paternal uncle; Colon cancer (age of onset: 77) in her father; Colon cancer (age of onset: 26) in her maternal grandfather and maternal uncle; Colon polyps in her mother and paternal uncle; Depression in her mother; Diabetes in her paternal grandmother; High Cholesterol in her father; Hypertension in her mother; Lung cancer in her maternal grandmother; Pancreatic cancer (age of onset: 73) in an other family member; Stroke in  her paternal grandfather and paternal grandmother. ? ?Allergies  ?Allergen Reactions  ? Milk-Related Compounds Other (See Comments)  ?  Upset stomach   ? ? ?  ? ?PHYSICAL EXAMINATION: ? ?Vital signs: BP 136/75   Pulse 69   Temp 97.7 ?F (36.5 ?C) (Temporal)   Ht '5\' 5"'$  (1.651 m)   Wt 200 lb (90.7 kg)   LMP 08/29/2021   SpO2 100%   BMI 33.28 kg/m?  ?General: Well-developed, well-nourished, no acute distress ?HEENT: Sclerae are anicteric, conjunctiva pink. Oral mucosa intact ?Lungs: Clear ?Heart: Regular ?Abdomen: soft, nontender, nondistended, no obvious ascites, no peritoneal signs, normal bowel sounds. No organomegaly. ?Extremities: No edema ?Psychiatric: alert and oriented x3. Cooperative   ? ? ? ?ASSESSMENT: ? ?1.  Family history of colon cancer ?2.  Colon cancer screening ?3.  Epigastric pain and globus sensation ? ? ?PLAN: ? ?Colonoscopy and upper endoscopy ? ? ? ? ?  ?

## 2021-09-24 NOTE — Progress Notes (Signed)
Called to room to assist during endoscopic procedure.  Patient ID and intended procedure confirmed with present staff. Received instructions for my participation in the procedure from the performing physician.  

## 2021-09-24 NOTE — Progress Notes (Signed)
Pt's states no medical or surgical changes since previsit or office visit. 

## 2021-09-26 ENCOUNTER — Other Ambulatory Visit (HOSPITAL_COMMUNITY): Payer: Self-pay

## 2021-09-26 ENCOUNTER — Encounter: Payer: Self-pay | Admitting: Internal Medicine

## 2021-09-26 ENCOUNTER — Telehealth: Payer: Self-pay

## 2021-09-26 MED ORDER — AMOXICILLIN-POT CLAVULANATE 875-125 MG PO TABS
1.0000 | ORAL_TABLET | Freq: Two times a day (BID) | ORAL | 0 refills | Status: DC
  Filled 2021-09-26: qty 20, 10d supply, fill #0

## 2021-09-26 NOTE — Telephone Encounter (Signed)
Follow up call placed, VM obtained and message left. ?SChaplin, RN,BSN ? ?

## 2021-09-29 ENCOUNTER — Other Ambulatory Visit (HOSPITAL_COMMUNITY): Payer: Self-pay

## 2021-09-29 DIAGNOSIS — L0231 Cutaneous abscess of buttock: Secondary | ICD-10-CM | POA: Diagnosis not present

## 2021-09-29 DIAGNOSIS — D2272 Melanocytic nevi of left lower limb, including hip: Secondary | ICD-10-CM | POA: Diagnosis not present

## 2021-09-29 DIAGNOSIS — L814 Other melanin hyperpigmentation: Secondary | ICD-10-CM | POA: Diagnosis not present

## 2021-09-29 DIAGNOSIS — D225 Melanocytic nevi of trunk: Secondary | ICD-10-CM | POA: Diagnosis not present

## 2021-09-29 DIAGNOSIS — L700 Acne vulgaris: Secondary | ICD-10-CM | POA: Diagnosis not present

## 2021-09-29 DIAGNOSIS — L738 Other specified follicular disorders: Secondary | ICD-10-CM | POA: Diagnosis not present

## 2021-09-29 DIAGNOSIS — D2261 Melanocytic nevi of right upper limb, including shoulder: Secondary | ICD-10-CM | POA: Diagnosis not present

## 2021-09-29 DIAGNOSIS — M67472 Ganglion, left ankle and foot: Secondary | ICD-10-CM | POA: Diagnosis not present

## 2021-09-29 MED ORDER — TRETINOIN 0.025 % EX CREA
TOPICAL_CREAM | CUTANEOUS | 11 refills | Status: DC
  Filled 2021-09-29 – 2021-10-13 (×2): qty 20, 30d supply, fill #0
  Filled 2021-11-14: qty 20, 30d supply, fill #1

## 2021-09-30 NOTE — Progress Notes (Signed)
? ? ? ? ?Chief Complaint:  ? ?OBESITY ?Jamie Nelson (MR# [id number]) is a 41 y.o. female who presents for evaluation and treatment of obesity and related comorbidities. Current Body mass index is 35.78 kg/m?Marland Kitchen Jamie Nelson has been struggling with her weight for many years and has been unsuccessful in either losing weight, maintaining weight loss, or reaching her healthy weight goal. ? 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 Has 3 kids and one with special needs. Her 44 year old son Jamie Nelson with Down's and Autism. 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 Training for a 24 mile hike currently, thus patient is active. She is also a friend of [place], NP. ? ?Jamie Nelson is currently in the action stage of change and ready to dedicate time achieving and maintaining a healthier weight. Jamie Nelson is interested in becoming our patient and working on intensive lifestyle modifications including (but not limited to) diet and exercise for weight loss. ? ?Jamie Nelson's habits were reviewed today and are as follows: Her family eats meals together, she thinks her family will eat healthier with her, her desired weight loss is 55-60 lbs, she has been heavy most of her life, she started gaining weight in college, her heaviest weight ever was 220 pounds, she has significant food cravings issues, she snacks frequently in the evenings, she skips meals frequently, she is frequently drinking liquids with calories, she frequently makes poor food choices, and she struggles with emotional eating. ? ?Depression Screen ?Jamie Nelson's Food and Mood (modified PHQ-9) score was 14. ? ? ?  09/10/2021  ?  8:38 AM  ?Depression screen PHQ 2/9  ?Decreased Interest 3  ?Down, Depressed, Hopeless 3  ?PHQ - 2 Score 6  ?Altered sleeping 2  ?Tired, decreased energy 3  ?Change in appetite 1  ?Feeling bad or failure about yourself  1  ?Trouble concentrating 1  ?Moving slowly or fidgety/restless 0  ?Suicidal thoughts 0  ?PHQ-9 Score  14  ?Difficult doing work/chores Somewhat difficult  ? ?Subjective:  ? ?1. Other fatigue ?Jamie Nelson admits to daytime somnolence and admits to waking up still tired. Patient has a history of symptoms of daytime fatigue, morning fatigue, and morning headache. Jamie Nelson generally gets 7 or 8 hours of sleep per night, and states that she has generally restful sleep. Snoring is present. Apneic episodes are not present. Epworth Sleepiness Score is 8.  ? ?2. SOB (shortness of breath) on exertion ?Jamie Nelson notes increasing shortness of breath with exercising and seems to be worsening over time with weight gain. She notes getting out of breath sooner with activity than she used to. This has not gotten worse recently. Jamie Nelson denies shortness of breath at rest or orthopnea. ? ?3. PCOS (polycystic ovarian syndrome) ?Diagnosis in 2015, she is on spironolactone and metformin since then per GYN. ? ?4. Gastroesophageal reflux disease, unspecified whether esophagitis present ?Symptoms are stable with no issues. She is taking omeprazole.  ? ?5. NAFLD (nonalcoholic fatty liver disease) ?Patient does have 1-2 drinks of ETOH per day on an average and can be more on the weekends that she is off work.  ? ?6. Mood disorder (HCC)with emotional eating ?Trying to ween herself off Zoloft. Feels her mood is pretty well controlled currently, and managed by her PCP.  ? ?7. At risk for deficient intake of food ?The patient is at a higher than average risk of deficient intake of food due to inadequate intake currently. ? ?Assessment/Plan:  ? ?  Orders Placed This Encounter  ?Procedures  ? Vitamin B12  ? CBC with Differential/Platelet  ? Comprehensive metabolic panel  ? Folate  ? Hemoglobin A1c  ? Insulin, random  ? Lipid Panel With LDL/HDL Ratio  ? T4  ? TSH  ? VITAMIN D 25 Hydroxy (Vit-D Deficiency, Fractures)  ? EKG 12-Lead  ? ? ?Medications Discontinued During This Encounter  ?Medication Reason  ? loratadine (CLARITIN) 10 MG tablet Patient Preference  ?   ? ?No orders of the defined types were placed in this encounter. ?  ? ?1. Other fatigue ?Jamie Nelson does feel that her weight is causing her energy to be lower than it should be. Fatigue may be related to obesity, depression or many other causes. Labs will be ordered, and in the meanwhile, Jamie Nelson will focus on self care including making healthy food choices, increasing physical activity and focusing on stress reduction. ? ?- EKG 12-Lead ?- Vitamin B12 ?- CBC with Differential/Platelet ?- Folate ?- T4 ?- TSH ?- VITAMIN D 25 Hydroxy (Vit-D Deficiency, Fractures) ? ?2. SOB (shortness of breath) on exertion ?Jamie Nelson does feel that she gets out of breath more easily that she used to when she exercises. Jamie Nelson's shortness of breath appears to be obesity related and exercise induced. She has agreed to work on weight loss and gradually increase exercise to treat her exercise induced shortness of breath. Will continue to monitor closely. ? ?3. PCOS (polycystic ovarian syndrome) ?We will check labs today. Follow up with GYN for medication management. Follow her prudent nutritional plan and weight loss should help. Encouraged to contact her GYN for any PCOS concerns in the future.  ? ?- Hemoglobin A1c ?- Insulin, random ? ?4. Gastroesophageal reflux disease, unspecified whether esophagitis present ?Intensive lifestyle modifications are the first line treatment for this issue. We discussed several lifestyle modifications today and she will continue to work on diet, exercise and weight loss efforts. Orders and follow up as documented in patient record.  ? ?Counseling ?If a person has gastroesophageal reflux disease (GERD), food and stomach acid move back up into the esophagus and cause symptoms or problems such as damage to the esophagus. ?Anti-reflux measures include: raising the head of the bed, avoiding tight clothing or belts, avoiding eating late at night, not lying down shortly after mealtime, and achieving weight loss. ?Avoid  ASA, NSAID's, caffeine, alcohol, and tobacco.  ?OTC Pepcid and/or Tums are often very helpful for as needed use.  ?However, for persisting chronic or daily symptoms, stronger medications like Omeprazole may be needed. ?You may need to avoid foods and drinks such as: ?Coffee and tea (with or without caffeine). ?Drinks that contain alcohol. ?Energy drinks and sports drinks. ?Bubbly (carbonated) drinks or sodas. ?Chocolate and cocoa. ?Peppermint and mint flavorings. ?Garlic and onions. ?Horseradish. ?Spicy and acidic foods. These include peppers, chili powder, curry powder, vinegar, hot sauces, and BBQ sauce. ?Citrus fruit juices and citrus fruits, such as oranges, lemons, and limes. ?Tomato-based foods. These include red sauce, chili, salsa, and pizza with red sauce. ?Fried and fatty foods. These include donuts, french fries, potato chips, and high-fat dressings. ?High-fat meats. These include hot dogs, rib eye steak, sausage, ham, and bacon. ? ?- Comprehensive metabolic panel ?- Lipid Panel With LDL/HDL Ratio ? ?5. NAFLD (nonalcoholic fatty liver disease) ?We will check labs today. Will follow her prudent nutritional plan and weight loss of at least 7-10 % to help with this condition. She will also monitor ETOH intake.  ? ?- Comprehensive metabolic panel ?-  Lipid Panel With LDL/HDL Ratio ? ?6. Mood disorder (HCC)with emotional eating ?We will check labs today. Follow prudent nutritional plan. Declines the need for Dr. Mallie Mussel referral today. Consider personal counselor/life coach to help her with life stressors if she plans to ween off medications.  ? ?- Lipid Panel With LDL/HDL Ratio ? ?7. Depression screening ?Shronda was given approximately 15 minutes of deficient intake of food prevention counseling today. Siana is at risk for eating too few calories based on current food recall. She was encouraged to focus on meeting caloric and protein goals according to her recommended meal plan. ? ?8. At risk for deficient  intake of food ?Annmarie was given extensive education and counseling today of more than 30 minutes on risks associated with deficient food intake.  Counseled her on the importance of following our prescribed meal

## 2021-10-02 DIAGNOSIS — Z09 Encounter for follow-up examination after completed treatment for conditions other than malignant neoplasm: Secondary | ICD-10-CM | POA: Diagnosis not present

## 2021-10-02 DIAGNOSIS — N909 Noninflammatory disorder of vulva and perineum, unspecified: Secondary | ICD-10-CM | POA: Diagnosis not present

## 2021-10-07 ENCOUNTER — Encounter (INDEPENDENT_AMBULATORY_CARE_PROVIDER_SITE_OTHER): Payer: Self-pay | Admitting: Family Medicine

## 2021-10-07 ENCOUNTER — Ambulatory Visit (INDEPENDENT_AMBULATORY_CARE_PROVIDER_SITE_OTHER): Payer: 59 | Admitting: Family Medicine

## 2021-10-07 ENCOUNTER — Other Ambulatory Visit (HOSPITAL_COMMUNITY): Payer: Self-pay

## 2021-10-07 VITALS — BP 132/79 | HR 58 | Temp 97.9°F | Ht 65.0 in | Wt 212.0 lb

## 2021-10-07 DIAGNOSIS — K219 Gastro-esophageal reflux disease without esophagitis: Secondary | ICD-10-CM | POA: Diagnosis not present

## 2021-10-07 DIAGNOSIS — E669 Obesity, unspecified: Secondary | ICD-10-CM | POA: Diagnosis not present

## 2021-10-07 DIAGNOSIS — Z6835 Body mass index (BMI) 35.0-35.9, adult: Secondary | ICD-10-CM

## 2021-10-07 DIAGNOSIS — E559 Vitamin D deficiency, unspecified: Secondary | ICD-10-CM | POA: Diagnosis not present

## 2021-10-07 DIAGNOSIS — E782 Mixed hyperlipidemia: Secondary | ICD-10-CM | POA: Diagnosis not present

## 2021-10-07 DIAGNOSIS — K760 Fatty (change of) liver, not elsewhere classified: Secondary | ICD-10-CM

## 2021-10-07 DIAGNOSIS — E66812 Obesity, class 2: Secondary | ICD-10-CM

## 2021-10-07 DIAGNOSIS — Z9189 Other specified personal risk factors, not elsewhere classified: Secondary | ICD-10-CM

## 2021-10-07 DIAGNOSIS — E88819 Insulin resistance, unspecified: Secondary | ICD-10-CM

## 2021-10-07 DIAGNOSIS — E8881 Metabolic syndrome: Secondary | ICD-10-CM | POA: Diagnosis not present

## 2021-10-07 MED ORDER — VITAMIN D (ERGOCALCIFEROL) 1.25 MG (50000 UNIT) PO CAPS
50000.0000 [IU] | ORAL_CAPSULE | ORAL | 0 refills | Status: DC
  Filled 2021-10-07: qty 4, 28d supply, fill #0

## 2021-10-07 NOTE — Patient Instructions (Signed)
The 10-year ASCVD risk score (Arnett DK, et al., 2019) is: 0.7% ?  Values used to calculate the score: ?    Age: 41 years ?    Sex: Female ?    Is Non-Hispanic African American: No ?    Diabetic: No ?    Tobacco smoker: No ?    Systolic Blood Pressure: 975 mmHg ?    Is BP treated: No ?    HDL Cholesterol: 70 mg/dL ?    Total Cholesterol: 245 mg/dL ? ?

## 2021-10-09 ENCOUNTER — Other Ambulatory Visit: Payer: Self-pay | Admitting: Surgery

## 2021-10-09 DIAGNOSIS — N909 Noninflammatory disorder of vulva and perineum, unspecified: Secondary | ICD-10-CM

## 2021-10-13 ENCOUNTER — Other Ambulatory Visit (HOSPITAL_COMMUNITY): Payer: Self-pay

## 2021-10-13 ENCOUNTER — Other Ambulatory Visit: Payer: Self-pay | Admitting: Obstetrics & Gynecology

## 2021-10-13 DIAGNOSIS — Z1231 Encounter for screening mammogram for malignant neoplasm of breast: Secondary | ICD-10-CM

## 2021-10-17 DIAGNOSIS — M79672 Pain in left foot: Secondary | ICD-10-CM | POA: Diagnosis not present

## 2021-10-19 NOTE — Progress Notes (Unsigned)
Chief Complaint:   OBESITY Jamie Nelson is here to discuss her progress with her obesity treatment plan along with follow-up of her obesity related diagnoses. Jamie Nelson is on the Category 2 Plan and states she is following her eating plan approximately 75% of the time. Jamie Nelson states she is hiking and waking for 30-45 minutes 3-4 times per week.  Today's visit was #: 1 Starting weight: 215 lbs Starting date: 09/10/2021 Today's weight: 212 lbs Today's date: 10/07/2021 Total lbs lost to date: 3 lbs Total lbs lost since last in-office visit: 3 lbs  Interim History: Jamie Nelson is here today for her first follow-up office visit since starting the program with [place].  All blood work/ lab tests that were recently ordered by myself or an outside provider were reviewed with patient today per their request.   Extended time was spent counseling her on all new disease processes that were discovered or preexisting ones that are affected by BMI.  she understands that many of these abnormalities will need to monitored regularly along with the current treatment plan of prudent dietary changes, in which we are making each and every office visit, to improve these health parameters.  Jamie Nelson's last office visit was 3-4 weeks ago. She went on vacation for 7 days and again to [place] with family for 5 days. She was off the plan then then. Jamie Nelson states it is a lot of protein and it is difficult to eat it all. She denies hunger and cravings while on the plan. She states that she is bored with breakfast.   Subjective:   1. Hyperlipidemia with hypertriglyceridemia- not requiring statin therapy Jamie Nelson was advised of labs and new onset of hyperlipemia with hypertriglyceridemia.  LDL is 145, elevated and triglycerides also elevated at 167. Her HDL is at 70 and excellent.  2. NAFLD (nonalcoholic fatty liver disease) Jamie Nelson was advised that this is worsening since her first diagnosis in 04/2021 - Labs (ALT/AST) discussed with  Jamie Nelson.  3. Gastroesophageal reflux disease, unspecified whether esophagitis present Jamie Nelson reports no symptoms since being on the meal plan, symptoms controlled. She is currently taking Prilosec.  Jamie Nelson had and EGD and colonoscopy on 09/24/2021 and was within normal limits essentially.  4. Insulin resistance Jamie Nelson had taken Saxenda in the past, but had GI side effects and did not tolerate it well. Per Jamie Nelson, her issues are not eating enough and skipping meals, not "binging". She denies cravings or hunger.  Discussed labs and new onset of insulin resistance with Jamie Nelson.  5. Vitamin D deficiency Jamie Nelson has not had a history of low Vitamin D, therefore, discussed new onset and labs; she is not at goal. She is not currently taking a supplement.  6. At risk for heart disease Jamie Nelson is at higher than average risk for cardiovascular disease due to new onset of hyperlipidemia with hypertriglyceridemia and new onset of insulin resistance.   Assessment/Plan:  No orders of the defined types were placed in this encounter.   There are no discontinued medications.   Meds ordered this encounter  Medications   Vitamin D, Ergocalciferol, (DRISDOL) 1.25 MG (50000 UNIT) CAPS capsule    Sig: Take 1 capsule (50,000 Units total) by mouth every 7 (seven) days.    Dispense:  4 capsule    Refill:  0     1. Hyperlipidemia with hypertriglyceridemia- not requiring statin therapy Jamie Nelson will continue with her prudent nutrition plan, lower salt intake, decrease trans fats and simple carbs/fatty carbs. She will decrease  her alcohol intake. There are no needs for medications at this time.  2. NAFLD (nonalcoholic fatty liver disease) Jamie Nelson's ALT has increase from prior lab. Counseling was discussed with Jamie Nelson.  NAFLD is an umbrella term that encompasses a disease spectrum that includes steatosis (fat) without inflammation, steatohepatitis (NASH; fat + inflammation in a characteristic pattern), and cirrhosis. Bland  steatosis is felt to be a benign condition, with extremely low to no risk of progression to cirrhosis, whereas NASH can progress to cirrhosis. The mainstay of treatment of NAFLD includes lifestyle modification to achieve weight loss, at least 7% of current body weight. Low carbohydrate diets can be beneficial in improving NAFLD liver histology. Additionally, exercise, even the absence of weight loss can have beneficial effects on the patient's metabolic profile and liver health.    3. Gastroesophageal reflux disease, unspecified whether esophagitis present Jamie Nelson will continue with her prudent nutrition plan and weight loss. She will continue medication per PCP or GI.  4. Insulin resistance Jamie Nelson will continue to work on weight loss, exercise, and decreasing simple carbohydrates to help decrease the risk of diabetes. Jamie Nelson agreed to follow-up with [place] as directed to closely monitor her progress. Handout for insulin resistance was given to Jamie Nelson.  5. Vitamin D deficiency - I discussed the importance of vitamin D to the patient's health and well-being.  - I reviewed possible symptoms of low Vitamin D:  low energy, depressed mood, muscle aches, joint aches, osteoporosis etc. was reviewed with patient - low Vitamin D levels may be linked to an increased risk of cardiovascular events and even increased risk of cancers- such as colon and breast.  - ideal vitamin D levels reviewed with patient  - I recommend pt take a once weekly prescription vit D - see script below   - Informed patient this may be a lifelong thing, and she was encouraged to continue to take the medicine until told otherwise.    - weight loss will likely improve availability of vitamin D, thus encouraged Jamie Nelson to continue with meal plan and their weight loss efforts to further improve this condition.  Thus, we will need to monitor levels regularly (every 3-4 mo on average) to keep levels within normal limits and prevent over  supplementation. - pt's questions and concerns regarding this condition addressed.   - Vitamin D, Ergocalciferol, (DRISDOL) 1.25 MG (50000 UNIT) CAPS capsule; Take 1 capsule (50,000 Units total) by mouth every 7 (seven) days.  Dispense: 4 capsule; Refill: 0  6. At risk for heart disease Due to Jamie Nelson current state of health and medical condition(s), she is at a higher risk for heart disease.  This puts the patient at much greater risk to subsequently develop cardiopulmonary conditions that can significantly affect patient's quality of life in a negative manner as well.    At least 24 minutes were spent on counseling Jamie Nelson about these concerns today, and we discussed the importance of reversing risks factors of obesity, especially truncal and visceral fat, hypertension, hyperlipidemia, and pre-diabetes.  The initial goal is to lose at least 5-10% of starting weight to help reduce these risk factors.  Counseling: Intensive lifestyle modifications were discussed with Jamie Nelson as the most appropriate first line of treatment.  she will continue to work on diet, exercise, and weight loss efforts.  We will continue to reassess these conditions on a fairly regular basis in an attempt to decrease the patient's overall morbidity and mortality.  Evidence-based interventions for health  behavior change were utilized today including the discussion of self monitoring techniques, problem-solving barriers, and SMART goal setting techniques.  Specifically, regarding patient's less desirable eating habits and patterns, we employed the technique of small changes when Jamie Nelson has not been able to fully commit to her prudent nutritional plan.   7. Obesity, Current BMI 35.3 Shandria is currently in the action stage of change. As such, her goal is to continue with weight loss efforts. She has agreed to the Category 2 Plan with breakfast options.  Exercise goals: As is.  Behavioral  modification strategies: increasing lean protein intake, decreasing simple carbohydrates, increasing water intake, decreasing liquid calories, and avoiding temptations.  Rishita has agreed to follow-up with our clinic in 2 weeks. She was informed of the importance of frequent follow-up visits to maximize her success with intensive lifestyle modifications for her multiple health conditions.   Objective:   Blood pressure 132/79, pulse (!) 58, temperature 97.9 F (36.6 C), height '5\' 5"'$  (1.651 m), weight 212 lb (96.2 kg), SpO2 99 %. Body mass index is 35.28 kg/m.  General: Cooperative, alert, well developed, in no acute distress. HEENT: Conjunctivae and lids unremarkable. Cardiovascular: Regular rhythm.  Lungs: Normal work of breathing. Neurologic: No focal deficits.   Lab Results  Component Value Date   CREATININE 0.84 09/10/2021   BUN 12 09/10/2021   NA 139 09/10/2021   K 4.3 09/10/2021   CL 100 09/10/2021   CO2 22 09/10/2021   Lab Results  Component Value Date   ALT 42 (H) 09/10/2021   AST 33 09/10/2021   ALKPHOS 130 (H) 09/10/2021   BILITOT 0.5 09/10/2021   Lab Results  Component Value Date   HGBA1C 5.1 09/10/2021   HGBA1C 5.1 05/03/2018   HGBA1C 5.0 12/22/2016   HGBA1C 5.0 03/30/2016   Lab Results  Component Value Date   INSULIN 7.5 09/10/2021   Lab Results  Component Value Date   TSH 1.290 09/10/2021   Lab Results  Component Value Date   CHOL 245 (H) 09/10/2021   HDL 70 09/10/2021   LDLCALC 145 (H) 09/10/2021   LDLDIRECT 132.6 06/26/2011   TRIG 167 (H) 09/10/2021   CHOLHDL 4 11/21/2020   Lab Results  Component Value Date   VD25OH 22.9 (L) 09/10/2021   VD25OH 24.60 (L) 11/21/2020   VD25OH 31.12 05/03/2018   Lab Results  Component Value Date   WBC 7.0 09/10/2021   HGB 14.5 09/10/2021   HCT 42.3 09/10/2021   MCV 90 09/10/2021   PLT 250 09/10/2021   No results found for: IRON, TIBC, FERRITIN  ARRANGE: Jeffrey was educated on the importance of  frequent visits to treat obesity as outlined per CMS and USPSTF guidelines and agreed to schedule her next follow up appointment today.  Attestation Statements:   Reviewed by clinician on day of visit: allergies, medications, problem list, medical history, surgical history, family history, social history, and previous encounter notes.  ILennette Bihari, CMA, am acting as transcriptionist for Dr. Raliegh Scarlet, DO  I have reviewed the above documentation for accuracy and completeness, and I agree with the above. Marjory Sneddon, D.O.  The [place] was signed into law in 2016 which includes the topic of electronic health records.  This provides immediate access to information in MyChart.  This includes consultation notes, operative notes, office notes, lab results and pathology reports.  If you have any questions about what you read please let us know at your next  visit so we can discuss your concerns and take corrective action if need be.  We are right here with you.

## 2021-10-21 ENCOUNTER — Ambulatory Visit
Admission: RE | Admit: 2021-10-21 | Discharge: 2021-10-21 | Disposition: A | Discharge status: Home / Self Care | Payer: 59 | Source: Ambulatory Visit | Attending: Surgery | Admitting: Surgery

## 2021-10-21 DIAGNOSIS — N750 Cyst of Bartholin's gland: Secondary | ICD-10-CM | POA: Diagnosis not present

## 2021-10-21 DIAGNOSIS — N858 Other specified noninflammatory disorders of uterus: Secondary | ICD-10-CM | POA: Diagnosis not present

## 2021-10-21 DIAGNOSIS — N909 Noninflammatory disorder of vulva and perineum, unspecified: Secondary | ICD-10-CM

## 2021-10-21 DIAGNOSIS — Z9889 Other specified postprocedural states: Secondary | ICD-10-CM | POA: Diagnosis not present

## 2021-10-21 MED ORDER — GADOBENATE DIMEGLUMINE 529 MG/ML IV SOLN
20.0000 mL | Freq: Once | INTRAVENOUS | Status: AC | PRN
  Administered 2021-10-21: 20 mL via INTRAVENOUS

## 2021-10-23 ENCOUNTER — Ambulatory Visit (INDEPENDENT_AMBULATORY_CARE_PROVIDER_SITE_OTHER): Payer: 59 | Admitting: Family Medicine

## 2021-10-28 ENCOUNTER — Other Ambulatory Visit: Payer: 59

## 2021-10-29 ENCOUNTER — Other Ambulatory Visit (HOSPITAL_COMMUNITY): Payer: Self-pay

## 2021-11-04 ENCOUNTER — Encounter (INDEPENDENT_AMBULATORY_CARE_PROVIDER_SITE_OTHER): Payer: Self-pay | Admitting: Family Medicine

## 2021-11-04 ENCOUNTER — Other Ambulatory Visit (HOSPITAL_COMMUNITY): Payer: Self-pay

## 2021-11-04 ENCOUNTER — Ambulatory Visit (INDEPENDENT_AMBULATORY_CARE_PROVIDER_SITE_OTHER): Payer: 59 | Admitting: Family Medicine

## 2021-11-04 VITALS — BP 132/79 | HR 107 | Temp 97.6°F | Ht 65.0 in | Wt 212.0 lb

## 2021-11-04 DIAGNOSIS — E559 Vitamin D deficiency, unspecified: Secondary | ICD-10-CM

## 2021-11-04 DIAGNOSIS — E669 Obesity, unspecified: Secondary | ICD-10-CM | POA: Diagnosis not present

## 2021-11-04 DIAGNOSIS — Z6835 Body mass index (BMI) 35.0-35.9, adult: Secondary | ICD-10-CM

## 2021-11-04 DIAGNOSIS — F430 Acute stress reaction: Secondary | ICD-10-CM

## 2021-11-04 DIAGNOSIS — Z9189 Other specified personal risk factors, not elsewhere classified: Secondary | ICD-10-CM

## 2021-11-04 MED ORDER — VITAMIN D (ERGOCALCIFEROL) 1.25 MG (50000 UNIT) PO CAPS
50000.0000 [IU] | ORAL_CAPSULE | ORAL | 0 refills | Status: DC
  Filled 2021-11-04: qty 4, 28d supply, fill #0

## 2021-11-10 NOTE — Progress Notes (Signed)
Chief Complaint:   OBESITY Jamie Nelson is here to discuss her progress with her obesity treatment plan along with follow-up of her obesity related diagnoses. Jamie Nelson is on the Category 2 Plan and states she is following her eating plan approximately 75% of the time. [name] states she is not currently exercising.  Today's visit was #: 3 Starting weight: 215 lbs Starting date: 09/10/2021 Today's weight: 212 lbs Today's date: 11/04/2021 Total lbs lost to date: 3 Total lbs lost since last in-office visit: 0  Interim History: Jamie Nelson attended a lot of celebrations lately and is glad she didn't gain weight. She has a ganglion cyst on her left foot that is becoming more bothersome and painful. She has surgery on the 29 th to remove it. Pt has not been exercising lately because of it.  Subjective:   1. Vitamin D deficiency Jamie Nelson started Ergocalciferol at her last OV and is tolerating medication(s) well without side effects. Medication compliance is good as patient endorses taking it as prescribed. The patient denies additional concerns regarding this condition.      2. Acute stress reaction Pt has a 13 year old son with Down's Syndrome and Autism. She is stressed with his care, especially over the summer months. 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  3. At risk for activity intolerance Jamie Nelson is at risk for exercise intolerance due to injury to left foot.  Assessment/Plan:  No orders of the defined types were placed in this encounter.   Medications Discontinued During This Encounter  Medication Reason   Vitamin D, Ergocalciferol, (DRISDOL) 1.25 MG (50000 UNIT) CAPS capsule Reorder     Meds ordered this encounter  Medications   Vitamin D, Ergocalciferol, (DRISDOL) 1.25 MG (50000 UNIT) CAPS capsule    Sig: Take 1 capsule (50,000 Units total) by mouth every 7 (seven) days.    Dispense:  4 capsule    Refill:  0      1. Vitamin D deficiency Low Vitamin D level contributes to fatigue and are associated with obesity, breast, and colon cancer. She agrees to continue to take prescription Vitamin D '@50'$ ,000 IU every week and will follow-up for routine testing of Vitamin D, at least 2-3 times per year to avoid over-replacement.  Refill- Vitamin D, Ergocalciferol, (DRISDOL) 1.25 MG (50000 UNIT) CAPS capsule; Take 1 capsule (50,000 Units total) by mouth every 7 (seven) days.  Dispense: 4 capsule; Refill: 0  2. Acute stress reaction Pt strongly encouraged to make an appointment with a Mental Health provider. Handout given with a list of providers in the area. Continue Zoloft.  3. At risk for activity intolerance [name] was given approximately 9 minutes of counseling today regarding her increased risk for exercise intolerance. We discussed patient's specific personal and medical issues that raise our concern. She was advised of strategies to prevent injury and ways to improve her cardiopulmonary fitness levels slowly over time. We additionally discussed various fitness trackers and smart phone apps to help motivate the patient to stay on track.   4. Obesity, Current BMI 35.3 Lisabeth is currently in the action stage of change. As such, her goal is to continue with weight loss efforts. She has agreed to the Category 2 Plan with breakfast and lunch options.   Handout: Eating Out Guide, Mental Health Resources  Exercise goals:  As is  Behavioral modification strategies: increasing lean protein intake, decreasing simple carbohydrates, and  planning for success.  Jamie Nelson has agreed to follow-up with our clinic in 2-3 weeks. She was informed of the importance of frequent follow-up visits to maximize her success with intensive lifestyle modifications for her multiple health conditions.   Objective:   Blood pressure 132/79, pulse (!) 107, temperature 97.6 F (36.4 C), height '5\' 5"'$  (1.651 m), weight 212 lb (96.2 kg), last  menstrual period 10/31/2021, SpO2 99 %. Body mass index is 35.28 kg/m.  General: Cooperative, alert, well developed, in no acute distress. HEENT: Conjunctivae and lids unremarkable. Cardiovascular: Regular rhythm.  Lungs: Normal work of breathing. Neurologic: No focal deficits.   Lab Results  Component Value Date   CREATININE 0.84 09/10/2021   BUN 12 09/10/2021   NA 139 09/10/2021   K 4.3 09/10/2021   CL 100 09/10/2021   CO2 22 09/10/2021   Lab Results  Component Value Date   ALT 42 (H) 09/10/2021   AST 33 09/10/2021   ALKPHOS 130 (H) 09/10/2021   BILITOT 0.5 09/10/2021   Lab Results  Component Value Date   HGBA1C 5.1 09/10/2021   HGBA1C 5.1 05/03/2018   HGBA1C 5.0 12/22/2016   HGBA1C 5.0 03/30/2016   Lab Results  Component Value Date   INSULIN 7.5 09/10/2021   Lab Results  Component Value Date   TSH 1.290 09/10/2021   Lab Results  Component Value Date   CHOL 245 (H) 09/10/2021   HDL 70 09/10/2021   LDLCALC 145 (H) 09/10/2021   LDLDIRECT 132.6 06/26/2011   TRIG 167 (H) 09/10/2021   CHOLHDL 4 11/21/2020   Lab Results  Component Value Date   VD25OH 22.9 (L) 09/10/2021   VD25OH 24.60 (L) 11/21/2020   VD25OH 31.12 05/03/2018   Lab Results  Component Value Date   WBC 7.0 09/10/2021   HGB 14.5 09/10/2021   HCT 42.3 09/10/2021   MCV 90 09/10/2021   PLT 250 09/10/2021    Attestation Statements:   Reviewed by clinician on day of visit: allergies, medications, problem list, medical history, surgical history, family history, social history, and previous encounter notes.  I, Kathlene November, BS, CMA, am acting as transcriptionist for [employer], DO.  I have reviewed the above documentation for accuracy and completeness, and I agree with the above. Marjory Sneddon, D.O.  The [place] was signed into law in 2016 which includes the topic of electronic health records.  This provides immediate access to information in MyChart.  This  includes consultation notes, operative notes, office notes, lab results and pathology reports.  If you have any questions about what you read please let us know at your next visit so we can discuss your concerns and take corrective action if need be.  We are right here with you.

## 2021-11-14 ENCOUNTER — Other Ambulatory Visit: Payer: Self-pay | Admitting: Family Medicine

## 2021-11-14 ENCOUNTER — Other Ambulatory Visit (HOSPITAL_COMMUNITY): Payer: Self-pay

## 2021-11-14 DIAGNOSIS — E282 Polycystic ovarian syndrome: Secondary | ICD-10-CM

## 2021-11-14 MED ORDER — SPIRONOLACTONE 50 MG PO TABS
50.0000 mg | ORAL_TABLET | Freq: Two times a day (BID) | ORAL | 0 refills | Status: DC
  Filled 2021-11-14: qty 180, 90d supply, fill #0

## 2021-11-20 ENCOUNTER — Other Ambulatory Visit: Payer: Self-pay | Admitting: Otolaryngology

## 2021-11-20 ENCOUNTER — Other Ambulatory Visit (HOSPITAL_COMMUNITY): Payer: Self-pay

## 2021-11-20 DIAGNOSIS — M67472 Ganglion, left ankle and foot: Secondary | ICD-10-CM | POA: Diagnosis not present

## 2021-11-20 HISTORY — PX: FOOT GANGLION EXCISION: SHX1660

## 2021-11-20 MED ORDER — OXYCODONE HCL 5 MG PO TABS
5.0000 mg | ORAL_TABLET | ORAL | 0 refills | Status: DC | PRN
  Filled 2021-11-20: qty 15, 3d supply, fill #0

## 2021-11-26 ENCOUNTER — Ambulatory Visit (INDEPENDENT_AMBULATORY_CARE_PROVIDER_SITE_OTHER): Payer: 59 | Admitting: Family Medicine

## 2021-11-27 ENCOUNTER — Ambulatory Visit: Payer: 59

## 2021-12-09 ENCOUNTER — Ambulatory Visit: Payer: 59

## 2021-12-18 ENCOUNTER — Ambulatory Visit (INDEPENDENT_AMBULATORY_CARE_PROVIDER_SITE_OTHER): Payer: 59 | Admitting: Family Medicine

## 2021-12-25 ENCOUNTER — Ambulatory Visit (INDEPENDENT_AMBULATORY_CARE_PROVIDER_SITE_OTHER): Payer: 59 | Admitting: Family Medicine

## 2021-12-25 ENCOUNTER — Other Ambulatory Visit (HOSPITAL_COMMUNITY): Payer: Self-pay

## 2021-12-31 ENCOUNTER — Encounter (INDEPENDENT_AMBULATORY_CARE_PROVIDER_SITE_OTHER): Payer: Self-pay

## 2022-01-02 ENCOUNTER — Other Ambulatory Visit (HOSPITAL_COMMUNITY): Payer: Self-pay

## 2022-01-05 ENCOUNTER — Encounter: Payer: Self-pay | Admitting: Obstetrics and Gynecology

## 2022-01-05 ENCOUNTER — Other Ambulatory Visit (HOSPITAL_COMMUNITY): Payer: Self-pay

## 2022-01-05 ENCOUNTER — Ambulatory Visit (INDEPENDENT_AMBULATORY_CARE_PROVIDER_SITE_OTHER): Payer: 59 | Admitting: Obstetrics and Gynecology

## 2022-01-05 VITALS — BP 153/95 | HR 73 | Ht 64.0 in | Wt 217.0 lb

## 2022-01-05 DIAGNOSIS — N825 Female genital tract-skin fistulae: Secondary | ICD-10-CM | POA: Diagnosis not present

## 2022-01-05 DIAGNOSIS — R350 Frequency of micturition: Secondary | ICD-10-CM

## 2022-01-05 LAB — POCT URINALYSIS DIPSTICK
Bilirubin, UA: NEGATIVE
Blood, UA: NEGATIVE
Glucose, UA: NEGATIVE
Ketones, UA: NEGATIVE
Leukocytes, UA: NEGATIVE
Nitrite, UA: NEGATIVE
Protein, UA: NEGATIVE
Spec Grav, UA: 1.015 (ref 1.010–1.025)
Urobilinogen, UA: 0.2 E.U./dL
pH, UA: 7 (ref 5.0–8.0)

## 2022-01-05 NOTE — Progress Notes (Signed)
[facility] New Patient Evaluation and Consultation  Referring Provider: Azucena Fallen, MD PCP: Midge Minium, MD Date of Service: 01/05/2022  SUBJECTIVE Chief Complaint: New Patient (Initial Visit) (Jamie Nelson is a 41 y.o. female here for a consult on a fistula)  History of Present Illness: Jamie Nelson is a 41 y.o. White or Caucasian female seen in consultation at the request of Dr. Benjie Karvonen for evaluation of fistula.    Review of records significant for: Had removal of bartholin cyst in Nov 2022, now with chronic drainage to the perineum.   MRI of the pelvis on 10/21/21: IMPRESSION: Postsurgical change of right-sided Bartholin gland cyst removal with mild stranding in the surgical bed and a persistent small tract extending from the skin surface to the right anterior aspect of the vagina likely reflecting the fistula intraoperatively visualized. No new drainable collection.  MRI 08/10/19: IMPRESSION: 2.5 x 1.9 x 1.1 cm focal area of abnormal signal in the subcutaneous fat of the right perineal region. No communication to the anus or rectum identified to suggest anal rectal fistula. Lesion does not appear classically cystic like a Bartholin's cyst although complication of a Bartholin's cyst is a consideration. Small focus of infectious/inflammatory change could have this appearance although there is limited edema in the surrounding subcutaneous fat.  Current:   Started our having swelling in right labial/ gluteal fold. After surgery, still had some drainage. In March- started antibiotics. Saw Dr Kae Heller again and had repeat MRI in May. Area did open and drain mostly blood.   Currently has mucous all the time. Also has blood come out when she is on her period.  Urinary Symptoms: Does not leak urine.   Day time voids 6.  Nocturia: 1 times per night to void. Voiding dysfunction: she empties her bladder well.  does not use a catheter to empty bladder.     UTIs: 4 UTI's in the last year.   Denies history of blood in urine and kidney or bladder stones  Pelvic Organ Prolapse Symptoms:                  She Denies a feeling of a bulge the vaginal area.   Bowel Symptom: Bowel movements: 1 time(s) per day Stool consistency: soft  Straining: no.  Splinting: no.  Incomplete evacuation: no.  She Denies accidental bowel leakage / fecal incontinence  Sexual Function Sexually active: yes.  Sexual orientation: Straight Pain with sex: at the vaginal opening, deep in the pelvis  Pelvic Pain Admits to pelvic pain Location: perineum, right buttock and gluteal fold Pain occurs: after activity/ chafing Prior pain treatment: bartholin cyst excision Improved by: rest Worsened by: exercise   Past Medical History:  Past Medical History:  Diagnosis Date   Abdominal discomfort    Alcohol abuse    ALLERGIC RHINITIS    Allergy    ANXIETY    hx -   Back pain    DEPRESSION    hx -   Family history of breast cancer    Family history of colon cancer    father  dx'd age 67   Family history of colonic polyps 07/27/2017   Family history of lung cancer    mat GM, smoker   Family history of pancreatic cancer    mat great Grandfather   Fatty liver    GERD (gastroesophageal reflux disease)    on omeprazole   Gestational edema    Gestational hypertension    Glaucoma  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   HEPATIC CYST 1988   hx of   History of COVID-19 09/2020   mild symptoms x few days all symptoms resolved except occ feels like lump in throat pt pcp aware, no trouble swallowing per pt   History of UTI    last uti 02-21-2021, no current uti symptoms   Lactose intolerance    Migraines    during pregnancy hormone related, none recent per pt on 04-01-2021   Obesity    PCOS (polycystic ovarian syndrome)    Seasonal allergies    Vitamin D deficiency    Wears glasses 04/01/2021   or contacts     Past Surgical  History:   Past Surgical History:  Procedure Laterality Date   Benign liver cyst removed  [place]  09/2008   CESAREAN SECTION  02/26/2012   Procedure: CESAREAN SECTION;  Surgeon: Claiborne Billings A. Pamala Hurry, MD;  Location: [facility];  Service: Obstetrics;  Laterality: N/A;  EDD: 02/27/12   CESAREAN SECTION WITH BILATERAL TUBAL LIGATION Bilateral 01/30/2014   Procedure: Repeat CESAREAN SECTION WITH TUBAL LIGATION;  Surgeon: Claiborne Billings A. Pamala Hurry, MD;  Location: [facility];  Service: Obstetrics;  Laterality: Bilateral;  EDD: 02/03/14   CYST EXCISION PERINEAL N/A 04/04/2021   Procedure: EXCISION OF PERINEAL CYST;  Surgeon: Clovis Riley, MD;  Location: [facility];  Service: General;  Laterality: N/A;   KNEE ARTHROSCOPY [place] N/A 01/30/2014   Procedure: SCAR REVISION;  Surgeon: Claiborne Billings A. Pamala Hurry, MD;  Location: [facility];  Service: Obstetrics;  Laterality: N/A;   tubal ligation  2015   wisdom teeth extraction  2004     Past OB/GYN History: OB History  Gravida Para Term Preterm AB Living  '3 3 3     3  '$ SAB IAB Ectopic Multiple Live Births          3    # Outcome Date GA Lbr Len/2nd Weight Sex Delivery Anes PTL Lv  3 Term 01/30/14 [redacted]w[redacted]d 8 lb 7.6 oz (3.845 kg) F CS-LTranv Spinal  LIV  2 Term 02/26/12 [id number] lb 9.6 oz (3.9 kg) F CS-LTranv Spinal  LIV  1 Term      CS-LTranv        Birth Comments: FTP   LMP Patient's last menstrual period was 12/26/2021. Contraception: tubal ligation. Any history of abnormal pap smears: no. Last pap- 2022   Medications: She has a current medication list which includes the following prescription(s): cetirizine, ibuprofen, metformin, omeprazole, sertraline, spironolactone, tretinoin, and vitamin d (ergocalciferol).   Allergies: Patient is allergic to milk-related compounds.   Social History:  Social History   Tobacco Use   Smoking status: Never   Smokeless tobacco: Never   Tobacco comments:    Married, lives with  spouse (robert who is MCChief Executive Officerf hospitalist service) and son JaBarnabas Listerho has down symdrome  Vaping Use   Vaping Use: Never used  Substance Use Topics   Alcohol use: Yes    Comment: socially   Drug use: No    Relationship status: married She lives with husband and children.   She is employed as a CVAutomotive engineerRegular exercise: Yes: limited by recent foot surgery and fistula History of abuse: No  Family History:   Family History  Problem Relation Age of Onset   Hypertension Mother    Depression Mother    Colon polyps Mother  goes every 2 years for c-scope   Colon cancer Father 19       dx at stage IV   High Cholesterol Father    Alcoholism Father    Lung cancer Maternal Grandmother    Colon cancer Maternal Grandfather 32   Stroke Paternal Grandmother    Diabetes Paternal Grandmother    Stroke Paternal Grandfather    Breast cancer Maternal Aunt 50       took HRT   Colon cancer Maternal Uncle 76   Colon cancer Paternal Uncle        dx late 60's   Colon cancer Paternal Uncle        dx late 60's   Colon polyps Paternal Uncle        precancerous   Pancreatic cancer Other 41   Esophageal cancer Neg Hx    Stomach cancer Neg Hx    Rectal cancer Neg Hx      Review of Systems: Review of Systems  Constitutional:  Negative for fever, malaise/fatigue and weight loss.  Respiratory:  Negative for cough, shortness of breath and wheezing.   Cardiovascular:  Negative for chest pain, palpitations and leg swelling.  Gastrointestinal:  Negative for abdominal pain and blood in stool.  Genitourinary:  Negative for dysuria.       + vaginal discharge  Musculoskeletal:  Negative for myalgias.  Skin:  Negative for rash.  Neurological:  Positive for headaches. Negative for dizziness.  Endo/Heme/Allergies:  Does not bruise/bleed easily.  Psychiatric/Behavioral:  Negative for depression. The patient is nervous/anxious.      OBJECTIVE Physical Exam: Vitals:   01/05/22  0951  BP: (!) 153/95  Pulse: 73  Weight: 217 lb (98.4 kg)  Height: '5\' 4"'$  (1.626 m)    Physical Exam Constitutional:      General: She is not in acute distress. Pulmonary:     Effort: Pulmonary effort is normal.  Abdominal:     General: There is no distension.     Palpations: Abdomen is soft.     Tenderness: There is no abdominal tenderness. There is no rebound.  Genitourinary:   Musculoskeletal:        General: No swelling. Normal range of motion.  Skin:    General: Skin is warm and dry.     Findings: No rash.  Neurological:     Mental Status: She is alert and oriented to person, place, and time.  Psychiatric:        Mood and Affect: Mood normal.        Behavior: Behavior normal.      GU / Detailed Urogynecologic Evaluation:  Pelvic Exam: Normal external female genitalia; Skene's glands normal in appearance; urethral meatus normal in appearance, no urethral masses or discharge.  Pinpoint area at 7 o'clock from the introitus lateral to the perineal body- expressed yellow discharge, cultured. (See diagram above)  CST: negative  Speculum exam reveals normal vaginal mucosa without atrophy. Cervix normal appearance. Uterus normal single, nontender. Adnexa no mass, fullness, tenderness.  No obvious fistula noted.      Pelvic floor strength I/V  Pelvic floor musculature: Right levator non-tender, Right obturator non-tender, Left levator non-tender, Left obturator non-tender  POP-Q:   POP-Q  -2.5                                            Aa   -  2.5                                           Ba  -8                                              C   3                                            Gh  4                                            Pb  9                                            tvl   -2                                            Ap  -2                                            Bp  -8                                              D     Rectal  Exam:  Normal external rectum  Post-Void Residual (PVR) by Bladder Scan: In order to evaluate bladder emptying, we discussed obtaining a postvoid residual and she agreed to this procedure.  Procedure: The ultrasound unit was placed on the patient's abdomen in the suprapubic region after the patient had voided. A PVR of 42 ml was obtained by bladder scan.  Laboratory Results: POC urine: negative   ASSESSMENT AND PLAN Ms. Buxbaum is a 41 y.o. with:  1. Vagino perineal fistula   2. Urinary frequency    - Based on MRI results and exam today, likely persistent fistulous tract is present. We reviewed that although there is a small possibility of closure on its own, this is unlikely since it has been persistent for several months. Other options would be fibrin glue or surgical excision. I would recommend surgical excision as the success of fibrin glue is not clear in this case.  - We reviewed surgery would depend on the findings once under anesthesia, but would likely require an incision through the vagina/ introitus laterally to the perineum. We reviewed healing would take 6-8 weeks and discussed surgical complications such as infection and recurrence of fistula.  - She will think about when is the best time for her to have surgery and will let us know.  Jaquita Folds, MD

## 2022-01-08 ENCOUNTER — Other Ambulatory Visit (HOSPITAL_COMMUNITY): Payer: Self-pay

## 2022-01-08 DIAGNOSIS — F411 Generalized anxiety disorder: Secondary | ICD-10-CM | POA: Diagnosis not present

## 2022-01-08 LAB — BODY FLUID CULTURE

## 2022-01-08 MED ORDER — SULFAMETHOXAZOLE-TRIMETHOPRIM 800-160 MG PO TABS
1.0000 | ORAL_TABLET | Freq: Two times a day (BID) | ORAL | 0 refills | Status: AC
  Filled 2022-01-08: qty 14, 7d supply, fill #0

## 2022-01-08 NOTE — Addendum Note (Signed)
Addended by: Jaquita Folds on: 01/08/2022 03:37 PM   Modules accepted: Orders

## 2022-01-12 ENCOUNTER — Ambulatory Visit (INDEPENDENT_AMBULATORY_CARE_PROVIDER_SITE_OTHER): Payer: 59 | Admitting: Family Medicine

## 2022-01-12 ENCOUNTER — Encounter (INDEPENDENT_AMBULATORY_CARE_PROVIDER_SITE_OTHER): Payer: Self-pay | Admitting: Family Medicine

## 2022-01-12 ENCOUNTER — Other Ambulatory Visit (HOSPITAL_COMMUNITY): Payer: Self-pay

## 2022-01-12 VITALS — BP 136/83 | HR 60 | Temp 98.1°F | Ht 64.0 in | Wt 208.0 lb

## 2022-01-12 DIAGNOSIS — Z7984 Long term (current) use of oral hypoglycemic drugs: Secondary | ICD-10-CM

## 2022-01-12 DIAGNOSIS — E559 Vitamin D deficiency, unspecified: Secondary | ICD-10-CM

## 2022-01-12 DIAGNOSIS — Z6835 Body mass index (BMI) 35.0-35.9, adult: Secondary | ICD-10-CM | POA: Diagnosis not present

## 2022-01-12 DIAGNOSIS — E669 Obesity, unspecified: Secondary | ICD-10-CM | POA: Diagnosis not present

## 2022-01-12 DIAGNOSIS — F5089 Other specified eating disorder: Secondary | ICD-10-CM | POA: Diagnosis not present

## 2022-01-12 DIAGNOSIS — E88819 Insulin resistance, unspecified: Secondary | ICD-10-CM

## 2022-01-12 DIAGNOSIS — E66812 Obesity, class 2: Secondary | ICD-10-CM

## 2022-01-12 DIAGNOSIS — N750 Cyst of Bartholin's gland: Secondary | ICD-10-CM | POA: Diagnosis not present

## 2022-01-12 DIAGNOSIS — E8881 Metabolic syndrome: Secondary | ICD-10-CM

## 2022-01-12 MED ORDER — VITAMIN D (ERGOCALCIFEROL) 1.25 MG (50000 UNIT) PO CAPS
50000.0000 [IU] | ORAL_CAPSULE | ORAL | 0 refills | Status: DC
  Filled 2022-01-12: qty 4, 28d supply, fill #0

## 2022-01-12 MED ORDER — BUPROPION HCL ER (SR) 100 MG PO TB12
100.0000 mg | ORAL_TABLET | Freq: Two times a day (BID) | ORAL | 0 refills | Status: DC
  Filled 2022-01-12: qty 60, 30d supply, fill #0

## 2022-01-16 DIAGNOSIS — F411 Generalized anxiety disorder: Secondary | ICD-10-CM | POA: Diagnosis not present

## 2022-01-18 NOTE — Progress Notes (Unsigned)
Chief Complaint:   OBESITY Jamie Nelson is here to discuss her progress with her obesity treatment plan along with follow-up of her obesity related diagnoses. Jamie Nelson is on the Category 2 Plan with breakfast and lunch options and states she is following her eating plan approximately 60% of the time. Jamie Nelson states she is not currently exercising.  Today's visit was #: 4 Starting weight: 215 lbs Starting date: 09/10/2021 Today's weight: 208 lbs Today's date: 01/12/2022 Total lbs lost to date: 7 Total lbs lost since last in-office visit: 4  Interim History: Jamie Nelson was lost to follow up. Her last OV was 11/04/21. Her mother fell and broke several bones in her face, etc, and pt was taking care of her. Pt then went on vacation to the beach for a week. Pt's son, Jamie Nelson, is with her in [place] today.  Subjective:   1. Vitamin D deficiency Jamie Nelson is tolerating medication(s) well without side effects.  Medication compliance is good as patient endorses taking it as prescribed.  The patient denies additional concerns regarding this condition.     2. Bartholin cyst, with fistula She went to see Uro-Gyn at [facility]. Pt was prescribed Bactrim DS with culture showing ecoli and klebsiella. She is looking to have additional procedure done in the near future for the fistula that has now formed.  3. Insulin resistance Pt has taken Saxenda in the past and DID NOT TOLERATE IT due to GI side effects. Again, pt endorses she is not eating enough and skips meals. She denies bunge eating.  4. Other disorder of eating, with emotional eating Bana started seeing Jamie Nelson for counseling for a week. They plan to meed weekly for now. Pt still skips a lot of meals, then eats/drinks alcohol with dinner.  Assessment/Plan:  No orders of the defined types were placed in this encounter.   Medications Discontinued During This Encounter  Medication Reason   Vitamin D, Ergocalciferol, (DRISDOL) 1.25 MG (50000  UNIT) CAPS capsule Reorder     Meds ordered this encounter  Medications   Vitamin D, Ergocalciferol, (DRISDOL) 1.25 MG (50000 UNIT) CAPS capsule    Sig: Take 1 capsule (50,000 Units total) by mouth every 7 (seven) days.    Dispense:  4 capsule    Refill:  0   buPROPion ER (WELLBUTRIN SR) 100 MG 12 hr tablet    Sig: Take 1 tablet (100 mg total) by mouth 2 (two) times daily.    Dispense:  60 tablet    Refill:  0     1. Vitamin D deficiency Low Vitamin D level contributes to fatigue and are associated with obesity, breast, and colon cancer. She agrees to continue to take prescription Vitamin D '@50'$ ,000 IU every week and will follow-up for routine testing of Vitamin D, at least 2-3 times per year to avoid over-replacement.  Refill- Vitamin D, Ergocalciferol, (DRISDOL) 1.25 MG (50000 UNIT) CAPS capsule; Take 1 capsule (50,000 Units total) by mouth every 7 (seven) days.  Dispense: 4 capsule; Refill: 0  2. Bartholin cyst, with fistula Pt with increased stress due to this condition and was discussed with pt. Continue care per Uro/Gyn.  3. Insulin resistance Continue Metformin and spironolactone for PCOS. Pt understands Metformin will help with insulin resistance as well. Long discussion with pt on GLP-1's. Continue current treatment plan. Jamie Nelson will continue to work on weight loss, exercise, and decreasing simple carbohydrates to help decrease the risk of diabetes. Jamie Nelson agreed to follow-up with [place]  as directed to closely monitor her progress.  4. Other disorder of eating, with emotional eating Behavior modification techniques were discussed today to help Jamie Nelson deal with her emotional/non-hunger eating behaviors.  Orders and follow up as documented in patient record. Start Wellbutrin 100 mg dose due to increase emotional eating and cravings. We also discussed that this will help with stressors and possibly alcohol cravings.  Start- buPROPion ER (WELLBUTRIN SR) 100 MG 12 hr tablet; Take 1 tablet  (100 mg total) by mouth 2 (two) times daily.  Dispense: 60 tablet; Refill: 0  5. Obesity, Current BMI 35.7 Jamie Nelson is currently in the action stage of change. As such, her goal is to continue with weight loss efforts. She has agreed to the Category 2 Plan with breakfast and lunch options and keeping a food journal and adhering to recommended goals of 250-300 calories and 20+ grams protein with breakfast.   Pt will have protein shakes for breakfast and will journal breakfast.  Exercise goals:  As is  Behavioral modification strategies: no skipping meals and meal planning and cooking strategies.  Jamie Nelson has agreed to follow-up with our clinic in 3 weeks. She was informed of the importance of frequent follow-up visits to maximize her success with intensive lifestyle modifications for her multiple health conditions.  dressed sooner. Jamie Nelson agreed to keep her next visit at the agreed upon time to discuss these results.  Objective:   Blood pressure 136/83, pulse 60, temperature 98.1 F (36.7 C), height '5\' 4"'$  (1.626 m), weight 208 lb (94.3 kg), last menstrual period 12/26/2021, SpO2 99 %. Body mass index is 35.7 kg/m.  General: Cooperative, alert, well developed, in no acute distress. HEENT: Conjunctivae and lids unremarkable. Cardiovascular: Regular rhythm.  Lungs: Normal work of breathing. Neurologic: No focal deficits.   Lab Results  Component Value Date   CREATININE 0.84 09/10/2021   BUN 12 09/10/2021   NA 139 09/10/2021   K 4.3 09/10/2021   CL 100 09/10/2021   CO2 22 09/10/2021   Lab Results  Component Value Date   ALT 42 (H) 09/10/2021   AST 33 09/10/2021   ALKPHOS 130 (H) 09/10/2021   BILITOT 0.5 09/10/2021   Lab Results  Component Value Date   HGBA1C 5.1 09/10/2021   HGBA1C 5.1 05/03/2018   HGBA1C 5.0 12/22/2016   HGBA1C 5.0 03/30/2016   Lab Results  Component Value Date   INSULIN 7.5 09/10/2021   Lab Results  Component Value Date   TSH 1.290 09/10/2021   Lab  Results  Component Value Date   CHOL 245 (H) 09/10/2021   HDL 70 09/10/2021   LDLCALC 145 (H) 09/10/2021   LDLDIRECT 132.6 06/26/2011   TRIG 167 (H) 09/10/2021   CHOLHDL 4 11/21/2020   Lab Results  Component Value Date   VD25OH 22.9 (L) 09/10/2021   VD25OH 24.60 (L) 11/21/2020   VD25OH 31.12 05/03/2018   Lab Results  Component Value Date   WBC 7.0 09/10/2021   HGB 14.5 09/10/2021   HCT 42.3 09/10/2021   MCV 90 09/10/2021   PLT 250 09/10/2021    Attestation Statements:   Reviewed by clinician on day of visit: allergies, medications, problem list, medical history, surgical history, family history, social history, and previous encounter notes.  Time spent on visit including pre-visit chart review and post-visit care and charting was 43 minutes.   I, Kathlene November, BS, CMA, am acting as transcriptionist for [employer], DO.  I have reviewed the above documentation for accuracy and completeness,  and I agree with the above. Marjory Sneddon, D.O.  The [place] was signed into law in 2016 which includes the topic of electronic health records.  This provides immediate access to information in MyChart.  This includes consultation notes, operative notes, office notes, lab results and pathology reports.  If you have any questions about what you read please let us know at your next visit so we can discuss your concerns and take corrective action if need be.  We are right here with you.

## 2022-01-20 ENCOUNTER — Ambulatory Visit
Admission: RE | Admit: 2022-01-20 | Discharge: 2022-01-20 | Disposition: A | Discharge status: Home / Self Care | Payer: 59 | Source: Ambulatory Visit | Attending: Obstetrics & Gynecology | Admitting: Obstetrics & Gynecology

## 2022-01-20 DIAGNOSIS — Z1231 Encounter for screening mammogram for malignant neoplasm of breast: Secondary | ICD-10-CM | POA: Diagnosis not present

## 2022-01-27 DIAGNOSIS — F411 Generalized anxiety disorder: Secondary | ICD-10-CM | POA: Diagnosis not present

## 2022-02-05 ENCOUNTER — Other Ambulatory Visit: Payer: Self-pay | Admitting: Family Medicine

## 2022-02-05 ENCOUNTER — Encounter (INDEPENDENT_AMBULATORY_CARE_PROVIDER_SITE_OTHER): Payer: Self-pay | Admitting: Family Medicine

## 2022-02-05 ENCOUNTER — Ambulatory Visit (INDEPENDENT_AMBULATORY_CARE_PROVIDER_SITE_OTHER): Payer: 59 | Admitting: Family Medicine

## 2022-02-05 ENCOUNTER — Other Ambulatory Visit (HOSPITAL_COMMUNITY): Payer: Self-pay

## 2022-02-05 VITALS — BP 131/82 | HR 66 | Temp 97.5°F | Ht 64.0 in | Wt 202.0 lb

## 2022-02-05 DIAGNOSIS — E559 Vitamin D deficiency, unspecified: Secondary | ICD-10-CM

## 2022-02-05 DIAGNOSIS — E669 Obesity, unspecified: Secondary | ICD-10-CM | POA: Diagnosis not present

## 2022-02-05 DIAGNOSIS — Z9189 Other specified personal risk factors, not elsewhere classified: Secondary | ICD-10-CM

## 2022-02-05 DIAGNOSIS — F5089 Other specified eating disorder: Secondary | ICD-10-CM

## 2022-02-05 DIAGNOSIS — Z6834 Body mass index (BMI) 34.0-34.9, adult: Secondary | ICD-10-CM | POA: Diagnosis not present

## 2022-02-05 MED ORDER — BUPROPION HCL ER (SR) 100 MG PO TB12
100.0000 mg | ORAL_TABLET | Freq: Two times a day (BID) | ORAL | 0 refills | Status: DC
  Filled 2022-02-05: qty 60, 30d supply, fill #0

## 2022-02-05 MED ORDER — VITAMIN D (ERGOCALCIFEROL) 1.25 MG (50000 UNIT) PO CAPS
50000.0000 [IU] | ORAL_CAPSULE | ORAL | 0 refills | Status: DC
  Filled 2022-02-05: qty 4, 28d supply, fill #0

## 2022-02-05 MED ORDER — SERTRALINE HCL 25 MG PO TABS
25.0000 mg | ORAL_TABLET | Freq: Every day | ORAL | 3 refills | Status: DC
  Filled 2022-02-05: qty 30, 30d supply, fill #0
  Filled 2022-03-17: qty 30, 30d supply, fill #1

## 2022-02-05 NOTE — Telephone Encounter (Signed)
Patient is requesting a refill of the following medications: Requested Prescriptions   Pending Prescriptions Disp Refills   sertraline (ZOLOFT) 25 MG tablet 30 tablet 3    Sig: Take 1 tablet (25 mg total) by mouth daily.    Date of patient request: 02/05/2022 Last office visit: 05/15/2021 Date of last refill: 07/23/2021 Last refill amount: 30 tablets 3 refills  Follow up time period per chart: n/a

## 2022-02-06 DIAGNOSIS — M19072 Primary osteoarthritis, left ankle and foot: Secondary | ICD-10-CM | POA: Diagnosis not present

## 2022-02-06 DIAGNOSIS — F411 Generalized anxiety disorder: Secondary | ICD-10-CM | POA: Diagnosis not present

## 2022-02-07 NOTE — Progress Notes (Signed)
Chief Complaint:   OBESITY Jamie Nelson is here to discuss her progress with her obesity treatment plan along with follow-up of her obesity related diagnoses. Jamie Nelson is on the Category 2 Plan with breakfast options; journaling 250-300 calories and 20+ grams of protein for breakfast.and states she is following her eating plan approximately 70% of the time. Jamie Nelson states she is running for  30 minutes 1-2 times per week.  Today's visit was #: 5 Starting weight: 215 lbs Starting date: 09/10/2021 Today's weight: 202 lbs Today's date: 02/05/2022 Total lbs lost to date: 13 lbs Total lbs lost since last in-office visit: 6 lbs  Interim History: Jamie Nelson is here for a follow up office visit.  We reviewed her meal plan and all questions were answered.  Patient's food recall appears to be accurate and consistent with what is on plan when she is following it.   When eating on plan, her hunger and cravings are well controlled.    Jamie Nelson states that her husband and daughter had Covid. Her stress is improving very much, even though her mom is now living with her.   Subjective:   1. Vitamin D deficiency She is currently taking prescription vitamin D 50,000 IU each week. She denies nausea, vomiting or muscle weakness.  Lab Results  Component Value Date   VD25OH 22.9 (L) 09/10/2021   VD25OH 24.60 (L) 11/21/2020   VD25OH 31.12 05/03/2018   2. Other disorder of eating, with emotional eating Jamie Nelson is tolerating Wellbutrin well  and reports it is helping her at work with focusing and less snacking; also her mental is much better as well. She is handling stress well and seeing Jamie Nelson weekly. Jamie Nelson also reports to be sleeping very well!  3. At risk for impaired metabolic function Jamie Nelson is at risk for impaired metabolic function due to her obesity and lifestyle.  Assessment/Plan:  No orders of the defined types were placed in this encounter.   Medications Discontinued During This  Encounter  Medication Reason   Vitamin D, Ergocalciferol, (DRISDOL) 1.25 MG (50000 UNIT) CAPS capsule Reorder   buPROPion ER (WELLBUTRIN SR) 100 MG 12 hr tablet Reorder     Meds ordered this encounter  Medications   Vitamin D, Ergocalciferol, (DRISDOL) 1.25 MG (50000 UNIT) CAPS capsule    Sig: Take 1 capsule (50,000 Units total) by mouth every 7 (seven) days.    Dispense:  4 capsule    Refill:  0   buPROPion ER (WELLBUTRIN SR) 100 MG 12 hr tablet    Sig: Take 1 tablet (100 mg total) by mouth 2 (two) times daily.    Dispense:  60 tablet    Refill:  0     1. Vitamin D deficiency Low Vitamin D level contributes to fatigue and are associated with obesity, breast, and colon cancer. She agrees to continue to take prescription Vitamin D '@50'$ ,000 IU every week and will follow-up for routine testing of Vitamin D, at least 2-3 times per year to avoid over-replacement. Will refill Vitamin D as follows: - Vitamin D, Ergocalciferol, (DRISDOL) 1.25 MG (50000 UNIT) CAPS capsule; Take 1 capsule (50,000 Units total) by mouth every 7 (seven) days.  Dispense: 4 capsule; Refill: 0  2. Other disorder of eating, with emotional eating Behavior modification techniques were discussed today to help Jamie Nelson deal with her emotional/non-hunger eating behaviors.  Orders and follow up as documented in patient record. Will refill Wellbutrin as follows: - buPROPion ER (WELLBUTRIN SR) 100  MG 12 hr tablet; Take 1 tablet (100 mg total) by mouth 2 (two) times daily.  Dispense: 60 tablet; Refill: 0  3. At risk for impaired metabolic function Jamie Nelson was given approximately 9 minutes of impaired  metabolic function prevention counseling today. We discussed intensive lifestyle modifications today with an emphasis on specific nutrition and exercise instructions and strategies.   Repetitive spaced learning was employed today to elicit superior memory formation and behavioral change.   4. Obesity, Current BMI 34.7 Jamie Nelson is  currently in the action stage of change. As such, her goal is to continue with weight loss efforts. She has agreed to keeping a food journal and adhering to recommended goals of 1200-1350 calories and 90 + grams protein or Category 2 Plan.  Exercise goals: As is.  Behavioral modification strategies: increasing lean protein intake, decreasing simple carbohydrates, and planning for success.  Jamie Nelson has agreed to follow-up with our clinic in 3 weeks. She was informed of the importance of frequent follow-up visits to maximize her success with intensive lifestyle modifications for her multiple health conditions.   Objective:   Blood pressure 131/82, pulse 66, temperature (!) 97.5 F (36.4 C), height '5\' 4"'$  (1.626 m), weight 202 lb (91.6 kg), SpO2 98 %. Body mass index is 34.67 kg/m.  General: Cooperative, alert, well developed, in no acute distress. HEENT: Conjunctivae and lids unremarkable. Cardiovascular: Regular rhythm.  Lungs: Normal work of breathing. Neurologic: No focal deficits.   Lab Results  Component Value Date   CREATININE 0.84 09/10/2021   BUN 12 09/10/2021   NA 139 09/10/2021   K 4.3 09/10/2021   CL 100 09/10/2021   CO2 22 09/10/2021   Lab Results  Component Value Date   ALT 42 (H) 09/10/2021   AST 33 09/10/2021   ALKPHOS 130 (H) 09/10/2021   BILITOT 0.5 09/10/2021   Lab Results  Component Value Date   HGBA1C 5.1 09/10/2021   HGBA1C 5.1 05/03/2018   HGBA1C 5.0 12/22/2016   HGBA1C 5.0 03/30/2016   Lab Results  Component Value Date   INSULIN 7.5 09/10/2021   Lab Results  Component Value Date   TSH 1.290 09/10/2021   Lab Results  Component Value Date   CHOL 245 (H) 09/10/2021   HDL 70 09/10/2021   LDLCALC 145 (H) 09/10/2021   LDLDIRECT 132.6 06/26/2011   TRIG 167 (H) 09/10/2021   CHOLHDL 4 11/21/2020   Lab Results  Component Value Date   VD25OH 22.9 (L) 09/10/2021   VD25OH 24.60 (L) 11/21/2020   VD25OH 31.12 05/03/2018   Lab Results  Component  Value Date   WBC 7.0 09/10/2021   HGB 14.5 09/10/2021   HCT 42.3 09/10/2021   MCV 90 09/10/2021   PLT 250 09/10/2021   No results found for: "IRON", "TIBC", "FERRITIN"  Attestation Statements:   Reviewed by clinician on day of visit: allergies, medications, problem list, medical history, surgical history, family history, social history, and previous encounter notes.  ILennette Bihari, CMA, am acting as transcriptionist for Dr. Raliegh Scarlet, DO.   I have reviewed the above documentation for accuracy and completeness, and I agree with the above. Marjory Sneddon, D.O.  The [place] was signed into law in 2016 which includes the topic of electronic health records.  This provides immediate access to information in MyChart.  This includes consultation notes, operative notes, office notes, lab results and pathology reports.  If you have any questions about what you read please let us know at  your next visit so we can discuss your concerns and take corrective action if need be.  We are right here with you.

## 2022-02-11 DIAGNOSIS — H5712 Ocular pain, left eye: Secondary | ICD-10-CM | POA: Diagnosis not present

## 2022-02-17 ENCOUNTER — Ambulatory Visit (INDEPENDENT_AMBULATORY_CARE_PROVIDER_SITE_OTHER): Payer: 59 | Admitting: Obstetrics and Gynecology

## 2022-02-17 ENCOUNTER — Encounter: Payer: Self-pay | Admitting: Obstetrics and Gynecology

## 2022-02-17 VITALS — BP 141/82 | HR 80

## 2022-02-17 DIAGNOSIS — N825 Female genital tract-skin fistulae: Secondary | ICD-10-CM | POA: Diagnosis not present

## 2022-02-17 NOTE — H&P (Signed)
[facility] Pre-Operative H&P  Subjective Chief Complaint: Jamie Nelson presents for a preoperative encounter.   History of Present Illness: Jamie Nelson is a 41 y.o. female who presents for preoperative visit.  She is scheduled to undergo perineal fistula repair with fibrin glue on 03/05/22.  We discussed use of MAC anesthesia if possible.   She reports that she still has some discharge but more like light blood.   MRI on 10/21/21 showed:  persistent small tract extending from the skin surface to the right anterior aspect of the vagina  Past Medical History:  Diagnosis Date   Abdominal discomfort    Alcohol abuse    ALLERGIC RHINITIS    Allergy    ANXIETY    hx -   Back pain    DEPRESSION    hx -   Family history of breast cancer    Family history of colon cancer    father  dx'd age 27   Family history of colonic polyps 07/27/2017   Family history of lung cancer    mat GM, smoker   Family history of pancreatic cancer    mat great Grandfather   Fatty liver    GERD (gastroesophageal reflux disease)    on omeprazole   Gestational edema    Gestational hypertension    Glaucoma    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   HEPATIC CYST 1988   hx of   History of COVID-19 09/2020   mild symptoms x few days all symptoms resolved except occ feels like lump in throat pt pcp aware, no trouble swallowing per pt   History of UTI    last uti 02-21-2021, no current uti symptoms   Lactose intolerance    Migraines    during pregnancy hormone related, none recent per pt on 04-01-2021   Obesity    PCOS (polycystic ovarian syndrome)    Seasonal allergies    Vitamin D deficiency    Wears glasses 04/01/2021   or contacts     Past Surgical History:  Procedure Laterality Date   Benign liver cyst removed  [place]  09/2008   CESAREAN SECTION  02/26/2012   Procedure: CESAREAN SECTION;  Surgeon: Claiborne Billings A. Pamala Hurry, MD;  Location:  [facility];  Service: Obstetrics;  Laterality: N/A;  EDD: 02/27/12   CESAREAN SECTION WITH BILATERAL TUBAL LIGATION Bilateral 01/30/2014   Procedure: Repeat CESAREAN SECTION WITH TUBAL LIGATION;  Surgeon: Claiborne Billings A. Pamala Hurry, MD;  Location: [facility];  Service: Obstetrics;  Laterality: Bilateral;  EDD: 02/03/14   CYST EXCISION PERINEAL N/A 04/04/2021   Procedure: EXCISION OF PERINEAL CYST;  Surgeon: Clovis Riley, MD;  Location: [facility];  Service: General;  Laterality: N/A;   KNEE ARTHROSCOPY [place] N/A 01/30/2014   Procedure: SCAR REVISION;  Surgeon: Claiborne Billings A. Pamala Hurry, MD;  Location: [facility];  Service: Obstetrics;  Laterality: N/A;   tubal ligation  2015   wisdom teeth extraction  2004    is allergic to milk-related compounds.   Family History  Problem Relation Age of Onset   Hypertension Mother    Depression Mother    Colon polyps Mother        goes every 2 years for c-scope   Colon cancer Father 89       dx at stage IV   High Cholesterol Father    Alcoholism Father    Lung cancer Maternal Grandmother  Colon cancer Maternal Grandfather 68   Stroke Paternal Grandmother    Diabetes Paternal Grandmother    Stroke Paternal Grandfather    Breast cancer Maternal Aunt 71       took HRT   Colon cancer Maternal Uncle 46   Colon cancer Paternal Uncle        dx late 60's   Colon cancer Paternal Uncle        dx late 60's   Colon polyps Paternal Uncle        precancerous   Pancreatic cancer Other 35   Esophageal cancer Neg Hx    Stomach cancer Neg Hx    Rectal cancer Neg Hx     Social History   Tobacco Use   Smoking status: Never   Smokeless tobacco: Never   Tobacco comments:    Married, lives with spouse (robert who is Chief Executive Officer of hospitalist service) and son Barnabas Lister who has down symdrome  Vaping Use   Vaping Use: Never used  Substance Use Topics   Alcohol use: Yes    Comment: socially   Drug use: No     Review of Systems was  negative for a full 10 system review except as noted in the History of Present Illness.  No current facility-administered medications for this encounter.  Current Outpatient Medications:    buPROPion ER (WELLBUTRIN SR) 100 MG 12 hr tablet, Take 1 tablet (100 mg total) by mouth 2 (two) times daily., Disp: 60 tablet, Rfl: 0   cetirizine (ZYRTEC) 10 MG tablet, Take 10 mg by mouth daily., Disp: , Rfl:    ibuprofen (ADVIL) 400 MG tablet, Take 400 mg by mouth every 6 (six) hours as needed., Disp: , Rfl:    metFORMIN (GLUCOPHAGE-XR) 500 MG 24 hr tablet, Take 2 tablets (1,000 mg total) by mouth 2 (two) times daily. (Patient taking differently: Take 1,000 mg by mouth 2 (two) times daily. For pcos), Disp: 360 tablet, Rfl: 1   omeprazole (PRILOSEC) 20 MG capsule, Take 1 capsule (20 mg total) by mouth daily., Disp: 30 capsule, Rfl: 3   sertraline (ZOLOFT) 25 MG tablet, Take 1 tablet (25 mg total) by mouth daily., Disp: 30 tablet, Rfl: 3   spironolactone (ALDACTONE) 50 MG tablet, Take 1 tablet (50 mg total) by mouth 2 (two) times daily., Disp: 180 tablet, Rfl: 0   tretinoin (RETIN-A) 0.025 % cream, Apply pea size amount to affected regions 2 to 3 times a week building up to nightly as tolerated., Disp: 20 g, Rfl: 11   Vitamin D, Ergocalciferol, (DRISDOL) 1.25 MG (50000 UNIT) CAPS capsule, Take 1 capsule (50,000 Units total) by mouth every 7 (seven) days., Disp: 4 capsule, Rfl: 0   Objective There were no vitals filed for this visit.   Gen: NAD CV: S1 S2 RRR Lungs: Clear to auscultation bilaterally Abd: soft, nontender   Previous Pelvic Exam showed: Pelvic Exam: Normal external female genitalia; Skene's glands normal in appearance; urethral meatus normal in appearance, no urethral masses or discharge.  Pinpoint area at 7 o'clock from the introitus lateral to the perineal body- expressed yellow discharge, cultured.    Assessment/ Plan  The patient is a 41 y.o. year old scheduled to undergo perineal  fistula repair with fibrin glue.    Jaquita Folds, MD

## 2022-02-17 NOTE — Progress Notes (Signed)
[facility] Pre-Operative visit  Subjective Chief Complaint: Jamie Nelson presents for a preoperative encounter.   History of Present Illness: Jamie Nelson is a 41 y.o. female who presents for preoperative visit.  She is scheduled to undergo perineal fistula repair with fibrin glue on 03/05/22.  We discussed use of MAC anesthesia if possible.   She reports that she still has some discharge but more like light blood.   MRI on 10/21/21 showed:  persistent small tract extending from the skin surface to the right anterior aspect of the vagina  Past Medical History:  Diagnosis Date   Abdominal discomfort    Alcohol abuse    ALLERGIC RHINITIS    Allergy    ANXIETY    hx -   Back pain    DEPRESSION    hx -   Family history of breast cancer    Family history of colon cancer    father  dx'd age 70   Family history of colonic polyps 07/27/2017   Family history of lung cancer    mat GM, smoker   Family history of pancreatic cancer    mat great Grandfather   Fatty liver    GERD (gastroesophageal reflux disease)    on omeprazole   Gestational edema    Gestational hypertension    Glaucoma    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   HEPATIC CYST 1988   hx of   History of COVID-19 09/2020   mild symptoms x few days all symptoms resolved except occ feels like lump in throat pt pcp aware, no trouble swallowing per pt   History of UTI    last uti 02-21-2021, no current uti symptoms   Lactose intolerance    Migraines    during pregnancy hormone related, none recent per pt on 04-01-2021   Obesity    PCOS (polycystic ovarian syndrome)    Seasonal allergies    Vitamin D deficiency    Wears glasses 04/01/2021   or contacts     Past Surgical History:  Procedure Laterality Date   Benign liver cyst removed  [place]  09/2008   CESAREAN SECTION  02/26/2012   Procedure: CESAREAN SECTION;  Surgeon: Claiborne Billings A. Pamala Hurry, MD;  Location:  [facility];  Service: Obstetrics;  Laterality: N/A;  EDD: 02/27/12   CESAREAN SECTION WITH BILATERAL TUBAL LIGATION Bilateral 01/30/2014   Procedure: Repeat CESAREAN SECTION WITH TUBAL LIGATION;  Surgeon: Claiborne Billings A. Pamala Hurry, MD;  Location: [facility];  Service: Obstetrics;  Laterality: Bilateral;  EDD: 02/03/14   CYST EXCISION PERINEAL N/A 04/04/2021   Procedure: EXCISION OF PERINEAL CYST;  Surgeon: Clovis Riley, MD;  Location: [facility];  Service: General;  Laterality: N/A;   KNEE ARTHROSCOPY [place] N/A 01/30/2014   Procedure: SCAR REVISION;  Surgeon: Claiborne Billings A. Pamala Hurry, MD;  Location: [facility];  Service: Obstetrics;  Laterality: N/A;   tubal ligation  2015   wisdom teeth extraction  2004    is allergic to milk-related compounds.   Family History  Problem Relation Age of Onset   Hypertension Mother    Depression Mother    Colon polyps Mother        goes every 2 years for c-scope   Colon cancer Father 85       dx at stage IV   High Cholesterol Father    Alcoholism Father    Lung cancer Maternal Grandmother  Colon cancer Maternal Grandfather 58   Stroke Paternal Grandmother    Diabetes Paternal Grandmother    Stroke Paternal Grandfather    Breast cancer Maternal Aunt 33       took HRT   Colon cancer Maternal Uncle 29   Colon cancer Paternal Uncle        dx late 60's   Colon cancer Paternal Uncle        dx late 60's   Colon polyps Paternal Uncle        precancerous   Pancreatic cancer Other 60   Esophageal cancer Neg Hx    Stomach cancer Neg Hx    Rectal cancer Neg Hx     Social History   Tobacco Use   Smoking status: Never   Smokeless tobacco: Never   Tobacco comments:    Married, lives with spouse (robert who is Chief Executive Officer of hospitalist service) and son Barnabas Lister who has down symdrome  Vaping Use   Vaping Use: Never used  Substance Use Topics   Alcohol use: Yes    Comment: socially   Drug use: No     Review of Systems was  negative for a full 10 system review except as noted in the History of Present Illness.   Current Outpatient Medications:    buPROPion ER (WELLBUTRIN SR) 100 MG 12 hr tablet, Take 1 tablet (100 mg total) by mouth 2 (two) times daily., Disp: 60 tablet, Rfl: 0   cetirizine (ZYRTEC) 10 MG tablet, Take 10 mg by mouth daily., Disp: , Rfl:    ibuprofen (ADVIL) 400 MG tablet, Take 400 mg by mouth every 6 (six) hours as needed., Disp: , Rfl:    metFORMIN (GLUCOPHAGE-XR) 500 MG 24 hr tablet, Take 2 tablets (1,000 mg total) by mouth 2 (two) times daily. (Patient taking differently: Take 1,000 mg by mouth 2 (two) times daily. For pcos), Disp: 360 tablet, Rfl: 1   omeprazole (PRILOSEC) 20 MG capsule, Take 1 capsule (20 mg total) by mouth daily., Disp: 30 capsule, Rfl: 3   sertraline (ZOLOFT) 25 MG tablet, Take 1 tablet (25 mg total) by mouth daily., Disp: 30 tablet, Rfl: 3   spironolactone (ALDACTONE) 50 MG tablet, Take 1 tablet (50 mg total) by mouth 2 (two) times daily., Disp: 180 tablet, Rfl: 0   tretinoin (RETIN-A) 0.025 % cream, Apply pea size amount to affected regions 2 to 3 times a week building up to nightly as tolerated., Disp: 20 g, Rfl: 11   Vitamin D, Ergocalciferol, (DRISDOL) 1.25 MG (50000 UNIT) CAPS capsule, Take 1 capsule (50,000 Units total) by mouth every 7 (seven) days., Disp: 4 capsule, Rfl: 0   Objective Vitals:   02/17/22 1148  BP: (!) 141/82  Pulse: 80    Gen: NAD CV: S1 S2 RRR Lungs: Clear to auscultation bilaterally Abd: soft, nontender   Previous Pelvic Exam showed: Pelvic Exam: Normal external female genitalia; Skene's glands normal in appearance; urethral meatus normal in appearance, no urethral masses or discharge.  Pinpoint area at 7 o'clock from the introitus lateral to the perineal body- expressed yellow discharge, cultured.    Assessment/ Plan  Assessment: The patient is a 41 y.o. year old scheduled to undergo perineal fistula repair with fibrin glue. Verbal  consent was obtained for these procedures.  Plan: General Surgical Consent: The patient has previously been counseled on alternative treatments, and the decision by the patient and provider was to proceed with the procedure listed above.  For all procedures, there are  risks of bleeding, infection, damage to surrounding organs including but not limited to bowel, bladder, blood vessels, ureters and nerves, and need for further surgery if an injury were to occur. These risks are all low with minimally invasive surgery.   There are risks of numbness and weakness at any body site or buttock/rectal pain.  It is possible that baseline pain can be worsened by surgery, either with or without mesh.  Very rare risks include blood transfusion, blood clot, heart attack, pneumonia, or death.   We discussed consent for blood products. Risks for blood transfusion include allergic reactions, other reactions that can affect different body organs and managed accordingly, transmission of infectious diseases such as HIV or Hepatitis. However, the blood is screened. Patient consents for blood products.  Pre-operative instructions:  She was instructed to not take Aspirin/NSAIDs x 7days prior to surgery. Antibiotic prophylaxis was ordered as indicated.  Post-operative instructions:  She was provided with specific post-operative instructions, including precautions and signs/symptoms for which we would recommend contacting us, in addition to daytime and after-hours contact phone numbers. This was provided on a handout.   Post-operative medications: She will take OTC motrin or tylenol post op.   Laboratory testing:  No labs required  Preoperative clearance:  She does not require surgical clearance.    Post-operative follow-up:  A post-operative appointment will be made for 4 weeks from the date of surgery. If she needs a post-operative nurse visit for a voiding trial, that will be set up after she leaves the hospital.     Patient will call the clinic or use MyChart should anything change or any new issues arise.   Jaquita Folds, MD  Time spent: I spent 20 minutes dedicated to the care of this patient on the date of this encounter to include pre-visit review of records, face-to-face time with the patient and post visit documentation.

## 2022-02-22 IMAGING — US US PELVIS LIMITED
1 series · 14 of 19 positions shown · non-contrast
Comparison: None.

CLINICAL DATA: Perineal mass

EXAM:
LIMITED ULTRASOUND OF PELVIS
TECHNIQUE: Limited transabdominal ultrasound examination of the pelvis was
performed.

[Series 1: us pelvis limited · 0.04mm/px · 19 acquisitions, 14 frames shown]
[im 1/19]
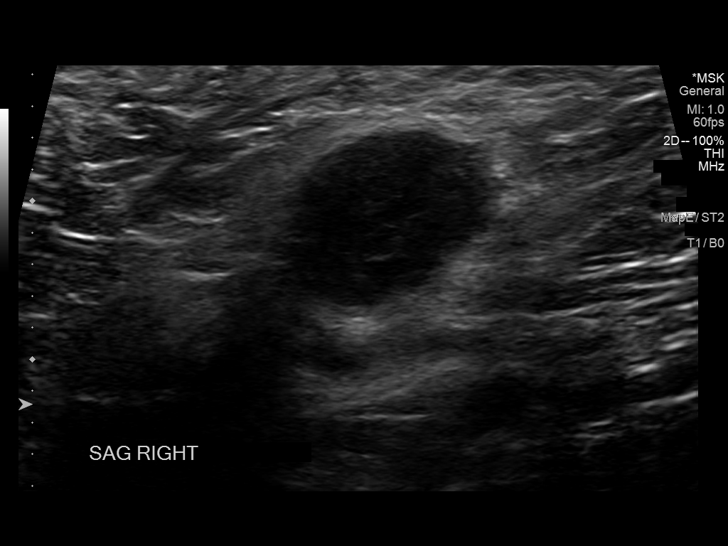
[im 3/19]
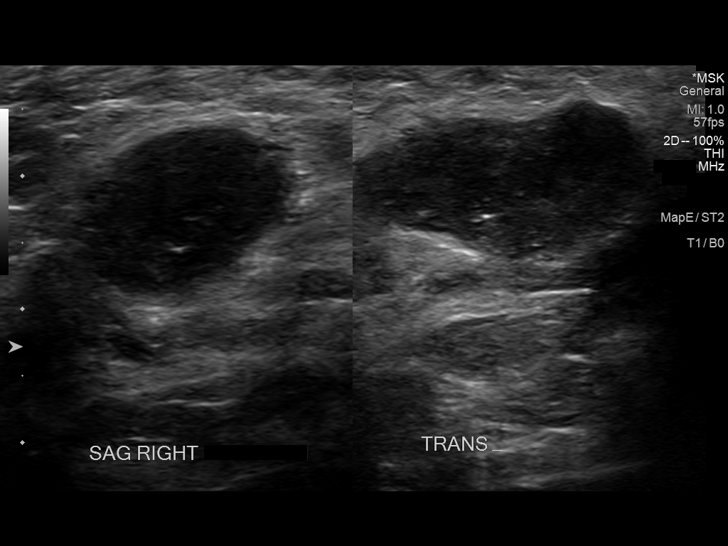
[im 4/19]
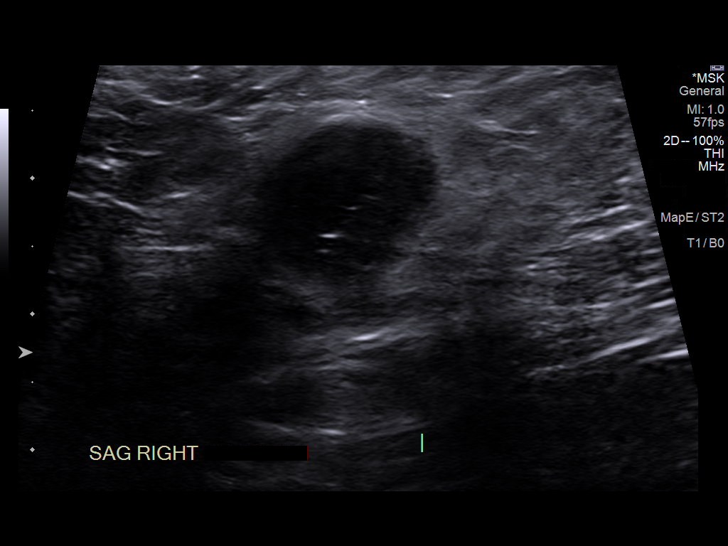
[im 5/19]
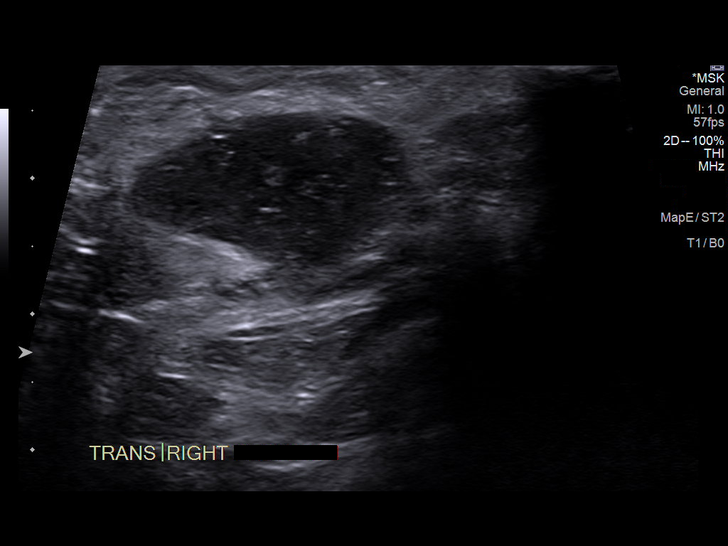
[im 7/19]
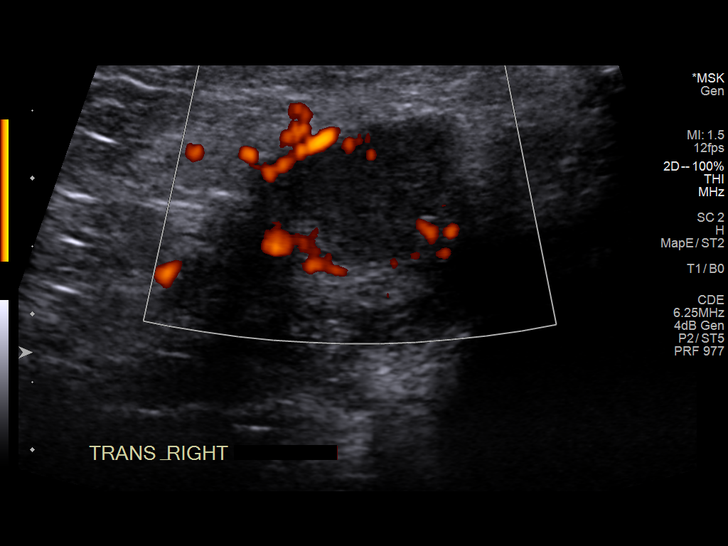
[im 8/19]
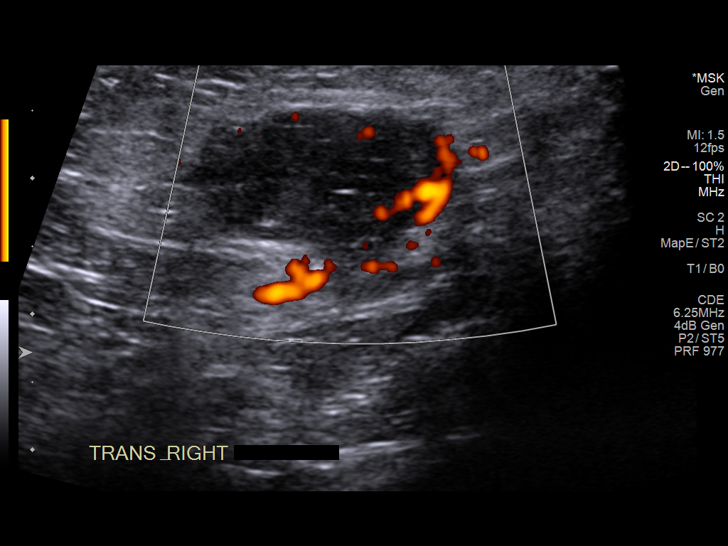
[im 9/19]
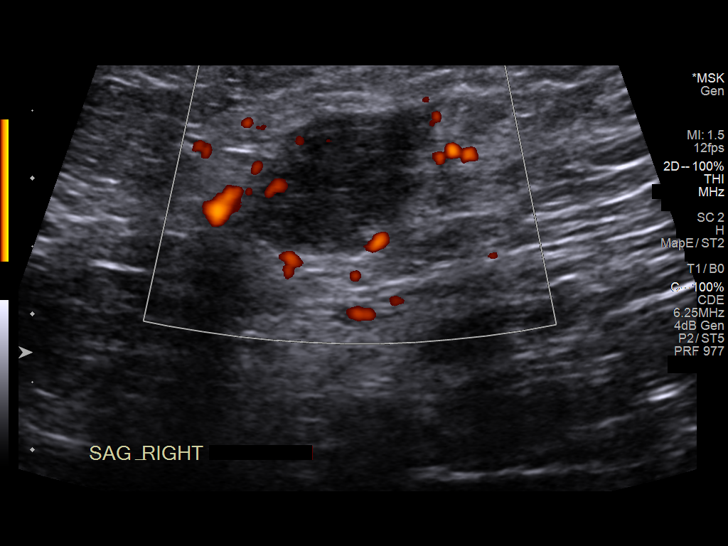
[im 11/19]
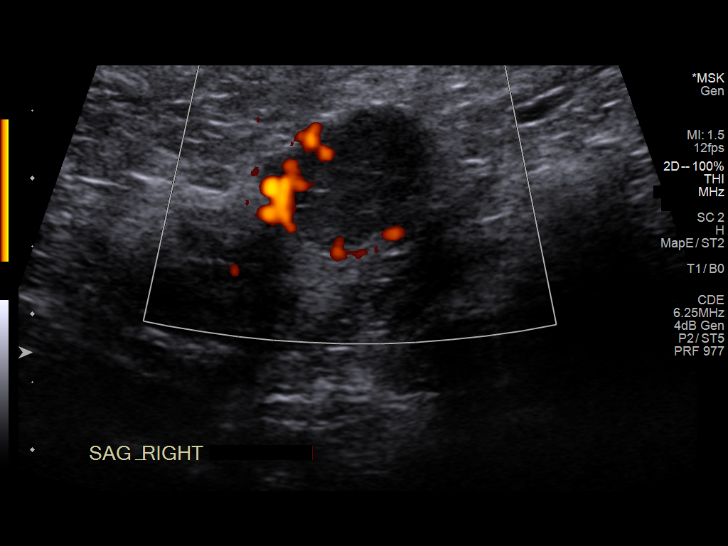
[im 12/19]
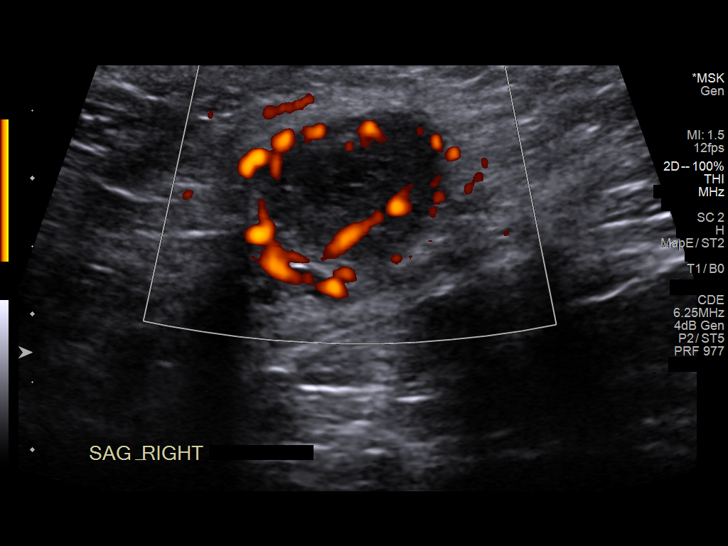
[im 13/19]
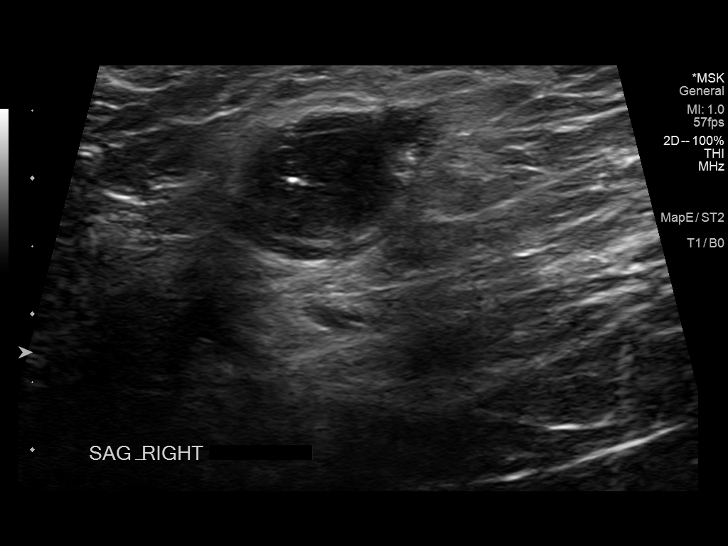
[im 15/19]
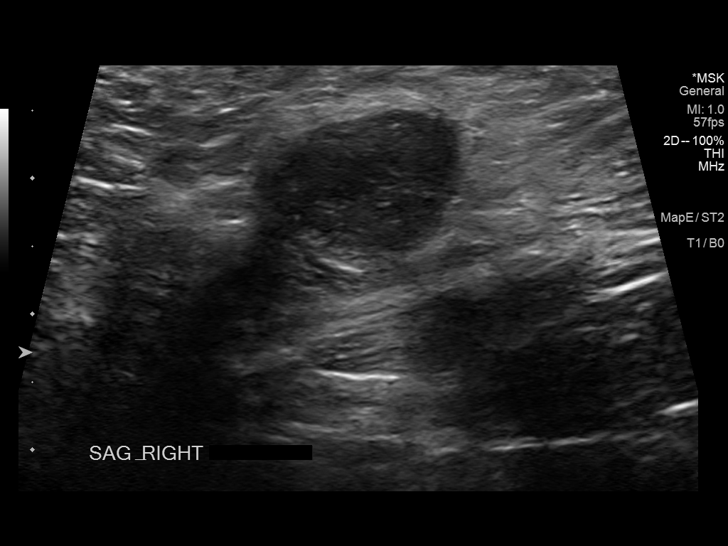
[im 16/19]
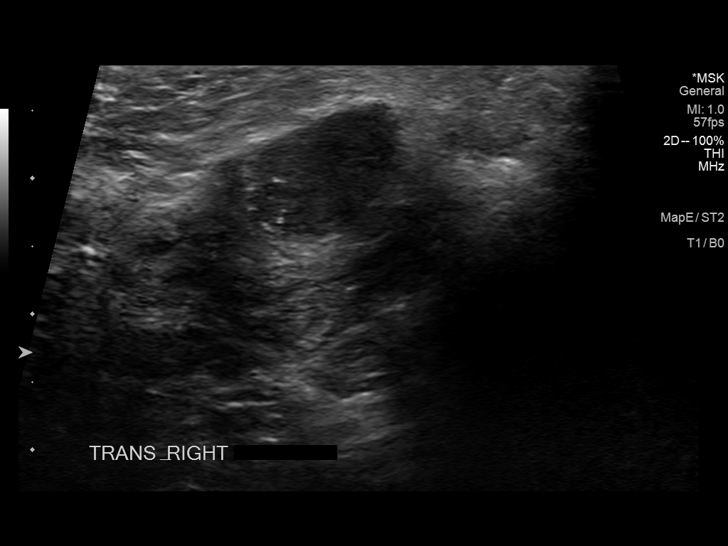
[im 17/19]
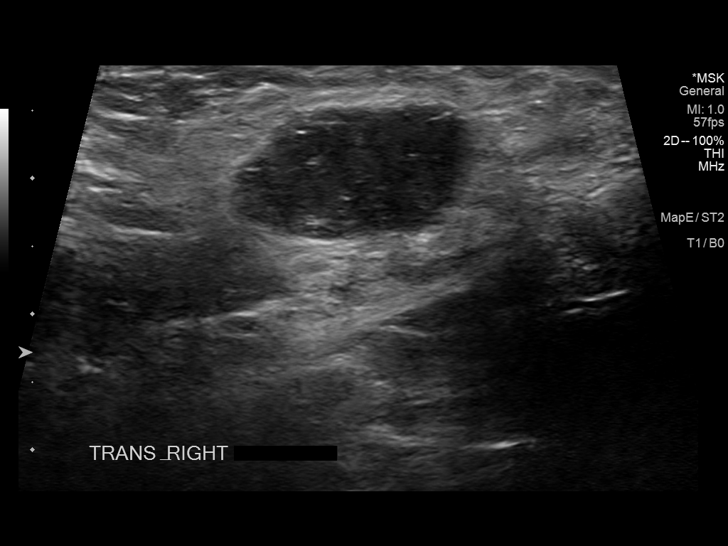
[im 19/19]
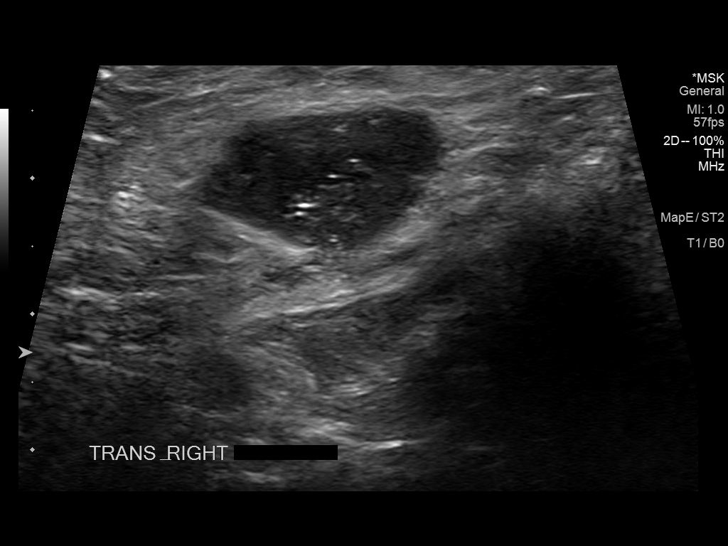

[14 of 19 positions shown; findings below may reference images not displayed]

FINDINGS: Ultrasound performed in the area concern in the right perineum. This
demonstrates a hypoechoic area which measures 2.2 x 1.3 x 1.1 cm.
Some areas of internal blood flow.
IMPRESSION: 2.2 x 1.3 x 1.1 cm hypoechoic area in the right perineum. While this
could reflect hematoma or area of developing abscess, there are some
areas of peripheral blood flow suggesting this could be a solid mass
lesion.

## 2022-02-24 ENCOUNTER — Encounter (HOSPITAL_BASED_OUTPATIENT_CLINIC_OR_DEPARTMENT_OTHER): Payer: Self-pay | Admitting: Obstetrics and Gynecology

## 2022-02-24 DIAGNOSIS — F411 Generalized anxiety disorder: Secondary | ICD-10-CM | POA: Diagnosis not present

## 2022-02-26 ENCOUNTER — Encounter (HOSPITAL_BASED_OUTPATIENT_CLINIC_OR_DEPARTMENT_OTHER): Payer: Self-pay | Admitting: Obstetrics and Gynecology

## 2022-02-26 NOTE — Progress Notes (Signed)
Spoke w/ via phone for pre-op interview--- pt Lab needs dos----  urine preg             Lab results------ no COVID test -----patient states asymptomatic no test needed Arrive at ------- 0745 on 03-05-2022 NPO after MN NO Solid Food.  Clear liquids from MN until--- 0545 Med rec completed Medications to take morning of surgery ----- wellbutrin, zoloft, zyrtec Diabetic medication -----  do not take metformin morning of surgery Patient instructed no nail polish to be worn day of surgery Patient instructed to bring photo id and insurance card day of surgery Patient aware to have Driver (ride ) / caregiver for 24 hours after surgery --- husband, robert Patient Special Instructions ----- n/a Pre-Op special Istructions ----- n/a Patient verbalized understanding of instructions that were given at this phone interview. Patient denies shortness of breath, chest pain, fever, cough at this phone interview.

## 2022-03-03 ENCOUNTER — Ambulatory Visit (INDEPENDENT_AMBULATORY_CARE_PROVIDER_SITE_OTHER): Payer: 59 | Admitting: Family Medicine

## 2022-03-05 ENCOUNTER — Encounter (HOSPITAL_BASED_OUTPATIENT_CLINIC_OR_DEPARTMENT_OTHER): Payer: Self-pay | Admitting: Obstetrics and Gynecology

## 2022-03-05 ENCOUNTER — Other Ambulatory Visit: Payer: Self-pay

## 2022-03-05 ENCOUNTER — Ambulatory Visit (HOSPITAL_BASED_OUTPATIENT_CLINIC_OR_DEPARTMENT_OTHER)
Admission: RE | Admit: 2022-03-05 | Discharge: 2022-03-05 | Disposition: A | Discharge status: Home / Self Care | Payer: 59 | Attending: Obstetrics and Gynecology | Admitting: Obstetrics and Gynecology

## 2022-03-05 ENCOUNTER — Encounter (HOSPITAL_BASED_OUTPATIENT_CLINIC_OR_DEPARTMENT_OTHER)
Admission: RE | Disposition: A | Discharge status: Home / Self Care | Payer: Self-pay | Source: Home / Self Care | Attending: Obstetrics and Gynecology

## 2022-03-05 ENCOUNTER — Ambulatory Visit (HOSPITAL_BASED_OUTPATIENT_CLINIC_OR_DEPARTMENT_OTHER): Payer: 59 | Admitting: Certified Registered Nurse Anesthetist

## 2022-03-05 DIAGNOSIS — E669 Obesity, unspecified: Secondary | ICD-10-CM

## 2022-03-05 DIAGNOSIS — Z87440 Personal history of urinary (tract) infections: Secondary | ICD-10-CM | POA: Diagnosis not present

## 2022-03-05 DIAGNOSIS — Z01818 Encounter for other preprocedural examination: Secondary | ICD-10-CM

## 2022-03-05 DIAGNOSIS — F418 Other specified anxiety disorders: Secondary | ICD-10-CM | POA: Insufficient documentation

## 2022-03-05 DIAGNOSIS — Z6833 Body mass index (BMI) 33.0-33.9, adult: Secondary | ICD-10-CM | POA: Diagnosis not present

## 2022-03-05 DIAGNOSIS — E282 Polycystic ovarian syndrome: Secondary | ICD-10-CM | POA: Insufficient documentation

## 2022-03-05 DIAGNOSIS — Z800 Family history of malignant neoplasm of digestive organs: Secondary | ICD-10-CM | POA: Insufficient documentation

## 2022-03-05 DIAGNOSIS — K603 Anal fistula: Secondary | ICD-10-CM

## 2022-03-05 DIAGNOSIS — Z79899 Other long term (current) drug therapy: Secondary | ICD-10-CM | POA: Diagnosis not present

## 2022-03-05 DIAGNOSIS — K604 Rectal fistula: Secondary | ICD-10-CM | POA: Insufficient documentation

## 2022-03-05 DIAGNOSIS — K219 Gastro-esophageal reflux disease without esophagitis: Secondary | ICD-10-CM | POA: Diagnosis not present

## 2022-03-05 DIAGNOSIS — N825 Female genital tract-skin fistulae: Secondary | ICD-10-CM | POA: Diagnosis present

## 2022-03-05 HISTORY — DX: Female genital tract-skin fistulae: N82.5

## 2022-03-05 HISTORY — DX: Insulin resistance, unspecified: E88.819

## 2022-03-05 HISTORY — DX: Personal history of other diseases of the nervous system and sense organs: Z86.69

## 2022-03-05 HISTORY — DX: Mixed hyperlipidemia: E78.2

## 2022-03-05 HISTORY — DX: Other seasonal allergic rhinitis: J30.2

## 2022-03-05 HISTORY — DX: Personal history of other complications of pregnancy, childbirth and the puerperium: Z87.59

## 2022-03-05 HISTORY — DX: Preglaucoma, unspecified, unspecified eye: H40.009

## 2022-03-05 HISTORY — DX: Fatty (change of) liver, not elsewhere classified: K76.0

## 2022-03-05 HISTORY — DX: Anxiety disorder, unspecified: F41.9

## 2022-03-05 HISTORY — PX: VESICO-VAGINAL FISTULA REPAIR: SHX5129

## 2022-03-05 HISTORY — DX: Depression, unspecified: F32.A

## 2022-03-05 LAB — POCT PREGNANCY, URINE: Preg Test, Ur: NEGATIVE

## 2022-03-05 SURGERY — CLOSURE, FISTULA, VESICOVAGINAL
Anesthesia: Monitor Anesthesia Care | Site: Perineum

## 2022-03-05 MED ORDER — PROPOFOL 500 MG/50ML IV EMUL
INTRAVENOUS | Status: AC
  Filled 2022-03-05: qty 50

## 2022-03-05 MED ORDER — FENTANYL CITRATE (PF) 100 MCG/2ML IJ SOLN
INTRAMUSCULAR | Status: AC
  Filled 2022-03-05: qty 2

## 2022-03-05 MED ORDER — MIDAZOLAM HCL 2 MG/2ML IJ SOLN
INTRAMUSCULAR | Status: AC
  Filled 2022-03-05: qty 2

## 2022-03-05 MED ORDER — ACETAMINOPHEN 500 MG PO TABS
1000.0000 mg | ORAL_TABLET | ORAL | Status: AC
  Administered 2022-03-05: 1000 mg via ORAL

## 2022-03-05 MED ORDER — PROPOFOL 500 MG/50ML IV EMUL
INTRAVENOUS | Status: DC | PRN
  Administered 2022-03-05: 200 ug/kg/min via INTRAVENOUS

## 2022-03-05 MED ORDER — PROPOFOL 1000 MG/100ML IV EMUL
INTRAVENOUS | Status: AC
  Filled 2022-03-05: qty 100

## 2022-03-05 MED ORDER — ACETAMINOPHEN 500 MG PO TABS
ORAL_TABLET | ORAL | Status: AC
  Filled 2022-03-05: qty 2

## 2022-03-05 MED ORDER — LACTATED RINGERS IV SOLN: INTRAVENOUS | Status: DC

## 2022-03-05 MED ORDER — DEXAMETHASONE SODIUM PHOSPHATE 10 MG/ML IJ SOLN
INTRAMUSCULAR | Status: AC
  Filled 2022-03-05: qty 1

## 2022-03-05 MED ORDER — CEFAZOLIN SODIUM-DEXTROSE 2-4 GM/100ML-% IV SOLN
INTRAVENOUS | Status: AC
  Filled 2022-03-05: qty 100

## 2022-03-05 MED ORDER — TISSEEL 10 ML EX KIT
PACK | Freq: Once | CUTANEOUS | Status: AC
  Administered 2022-03-05: 10 mL via TOPICAL
  Filled 2022-03-05: qty 1

## 2022-03-05 MED ORDER — OXYCODONE HCL 5 MG PO TABS: 5.0000 mg | ORAL_TABLET | Freq: Once | ORAL | Status: DC | PRN

## 2022-03-05 MED ORDER — ONDANSETRON HCL 4 MG/2ML IJ SOLN
INTRAMUSCULAR | Status: DC | PRN
  Administered 2022-03-05: 4 mg via INTRAVENOUS

## 2022-03-05 MED ORDER — PROPOFOL 10 MG/ML IV BOLUS
INTRAVENOUS | Status: DC | PRN
  Administered 2022-03-05 (×2): 20 mg via INTRAVENOUS
  Administered 2022-03-05: 10 mg via INTRAVENOUS
  Administered 2022-03-05 (×2): 20 mg via INTRAVENOUS

## 2022-03-05 MED ORDER — STERILE WATER FOR IRRIGATION IR SOLN
Status: DC | PRN
  Administered 2022-03-05: 1000 mL

## 2022-03-05 MED ORDER — CEFAZOLIN SODIUM-DEXTROSE 2-4 GM/100ML-% IV SOLN
2.0000 g | INTRAVENOUS | Status: AC
  Administered 2022-03-05: 2 g via INTRAVENOUS

## 2022-03-05 MED ORDER — METHYLENE BLUE 1 % INJ SOLN
INTRAVENOUS | Status: DC | PRN
  Administered 2022-03-05: 1 mL via SUBMUCOSAL

## 2022-03-05 MED ORDER — HYDROGEN PEROXIDE 3 % EX SOLN
CUTANEOUS | Status: DC | PRN
  Administered 2022-03-05: 1

## 2022-03-05 MED ORDER — DEXMEDETOMIDINE HCL IN NACL 200 MCG/50ML IV SOLN
INTRAVENOUS | Status: DC | PRN
  Administered 2022-03-05: 8 ug via INTRAVENOUS

## 2022-03-05 MED ORDER — MIDAZOLAM HCL 5 MG/5ML IJ SOLN
INTRAMUSCULAR | Status: DC | PRN
  Administered 2022-03-05: 2 mg via INTRAVENOUS

## 2022-03-05 MED ORDER — ONDANSETRON HCL 4 MG/2ML IJ SOLN
INTRAMUSCULAR | Status: AC
  Filled 2022-03-05: qty 2

## 2022-03-05 MED ORDER — LIDOCAINE HCL (PF) 2 % IJ SOLN
INTRAMUSCULAR | Status: AC
  Filled 2022-03-05: qty 5

## 2022-03-05 MED ORDER — PROPOFOL 10 MG/ML IV BOLUS
INTRAVENOUS | Status: AC
  Filled 2022-03-05: qty 20

## 2022-03-05 MED ORDER — FENTANYL CITRATE (PF) 250 MCG/5ML IJ SOLN
INTRAMUSCULAR | Status: DC | PRN
  Administered 2022-03-05: 25 ug via INTRAVENOUS
  Administered 2022-03-05: 50 ug via INTRAVENOUS
  Administered 2022-03-05: 25 ug via INTRAVENOUS

## 2022-03-05 MED ORDER — LIDOCAINE-EPINEPHRINE 1 %-1:100000 IJ SOLN
INTRAMUSCULAR | Status: DC | PRN
  Administered 2022-03-05: 10 mL

## 2022-03-05 MED ORDER — PROMETHAZINE HCL 25 MG/ML IJ SOLN: 6.2500 mg | INTRAMUSCULAR | Status: DC | PRN

## 2022-03-05 MED ORDER — HYDROMORPHONE HCL 1 MG/ML IJ SOLN: 0.2500 mg | INTRAMUSCULAR | Status: DC | PRN

## 2022-03-05 MED ORDER — OXYCODONE HCL 5 MG/5ML PO SOLN: 5.0000 mg | Freq: Once | ORAL | Status: DC | PRN

## 2022-03-05 MED ORDER — DEXAMETHASONE SODIUM PHOSPHATE 4 MG/ML IJ SOLN
INTRAMUSCULAR | Status: DC | PRN
  Administered 2022-03-05: 4 mg via INTRAVENOUS

## 2022-03-05 SURGICAL SUPPLY — 28 items
GAUZE 4X4 16PLY ~~LOC~~+RFID DBL (SPONGE) ×2 IMPLANT
GLOVE BIO SURGEON STRL SZ 6 (GLOVE) ×1 IMPLANT
GLOVE BIOGEL PI IND STRL 6.5 (GLOVE) ×1 IMPLANT
GOWN STRL REUS W/TWL LRG LVL3 (GOWN DISPOSABLE) ×1 IMPLANT
KIT TURNOVER CYSTO (KITS) ×1 IMPLANT
LIGASURE IMPACT 36 18CM CVD LR (INSTRUMENTS) IMPLANT
NDL MAYO 6 CRC TAPER PT (NEEDLE) ×1 IMPLANT
NDL SPNL 22GX3.5 QUINCKE BK (NEEDLE) IMPLANT
NEEDLE MAYO 6 CRC TAPER PT (NEEDLE) ×1 IMPLANT
NEEDLE SPNL 22GX3.5 QUINCKE BK (NEEDLE) ×1 IMPLANT
NS IRRIG 1000ML POUR BTL (IV SOLUTION) ×1 IMPLANT
PACK VAGINAL WOMENS (CUSTOM PROCEDURE TRAY) ×1 IMPLANT
PAD OB MATERNITY 4.3X12.25 (PERSONAL CARE ITEMS) ×1 IMPLANT
SET IRRIG Y TYPE TUR BLADDER L (SET/KITS/TRAYS/PACK) IMPLANT
SPIKE FLUID TRANSFER (MISCELLANEOUS) IMPLANT
SPONGE T-LAP 18X36 ~~LOC~~+RFID STR (SPONGE) ×1 IMPLANT
SUT PDS PLUS 0 (SUTURE)
SUT PDS PLUS AB 0 CT-2 (SUTURE) ×4 IMPLANT
SUT VIC AB 0 CT1 27 (SUTURE)
SUT VIC AB 0 CT1 27XBRD ANBCTR (SUTURE) ×1 IMPLANT
SUT VIC AB 0 CT1 27XCR 8 STRN (SUTURE) ×2 IMPLANT
SUT VIC AB 2-0 SH 27 (SUTURE) ×1
SUT VIC AB 2-0 SH 27XBRD (SUTURE) IMPLANT
SUT VICRYL 2-0 SH 8X27 (SUTURE) IMPLANT
SYR 10ML LL (SYRINGE) ×1 IMPLANT
TOWEL OR 17X26 10 PK STRL BLUE (TOWEL DISPOSABLE) ×1 IMPLANT
TRAY FOLEY W/BAG SLVR 14FR LF (SET/KITS/TRAYS/PACK) ×1 IMPLANT
UNDERPAD 30X36 HEAVY ABSORB (UNDERPADS AND DIAPERS) ×1 IMPLANT

## 2022-03-05 NOTE — Transfer of Care (Signed)
Immediate Anesthesia Transfer of Care Note  Patient: Jamie Nelson  Procedure(s) Performed: PERINEAL FISTULA REPAIR WITH FIBRIN GLUE (Perineum)  Patient Location: PACU  Anesthesia Type:MAC  Level of Consciousness: awake, alert , oriented and patient cooperative  Airway & Oxygen Therapy: Patient Spontanous Breathing  Post-op Assessment: Report given to RN and Post -op Vital signs reviewed and stable  Post vital signs: Reviewed and stable  Last Vitals:  Vitals Value Taken Time  BP 118/71 03/05/22 1101  Temp    Pulse 78 03/05/22 1104  Resp 20 03/05/22 1104  SpO2 97 % 03/05/22 1104  Vitals shown include unvalidated device data.  Last Pain:  Vitals:   03/05/22 0835  TempSrc: Oral  PainSc: 0-No pain      Patients Stated Pain Goal: 3 (41/03/01 3143)  Complications: No notable events documented.

## 2022-03-05 NOTE — Op Note (Signed)
Operative Note  Preoperative Diagnosis:  vaginal-perineal fistula  Postoperative Diagnosis: recto-perineal fistula  Procedures performed:  Repair of perineal fistula with fibrin glue (tissel)   Attending Surgeon: Sherlene Shams, MD  Anesthesia: MAC  Findings: With placement of methylene blue through perineal fistula, fistula tracked to the anterior distal rectum.    Specimens: * No specimens in log *  Estimated blood loss: 5 mL  IV fluids: 600 mL  Urine output: not recorded  Complications: none  Procedure in Detail: After informed consent was obtained the patient was taken to the operating where MAC anesthesia was induced and found to be adequate.  She was placed in dorsal lithotomy position taking care to avoid any nerve injury and prepped and draped in usual sterile fashion.  A fistula was noted on the right side of the perineum at the 7 o'clock position.  There appeared to be 2 holes with overlying skin connecting.  This area was injected with 1% lidocaine with epinephrine and the skin was incised with Bovie. A small lacrimal duct probe was then placed into the fistula tract to attempt to identify the path.  This was then removed and a combination of saline hydrogen peroxide and methylene blue was injected into the fistula tract.  A speculum was replaced into the vagina to provide retraction and no methylene blue was noted in the vaginal vault.  Instead methylene blue was noted to be expelling from the rectal area.  A rectal speculum was placed. Irrigation was performed with saline to clean the rectum. The dye was then injected again into the perineal fistula tract and dye was noted to expel at the distal anterior rectal wall.  A lacrimal duct probe was gently placed into the rectal fistula and was noted to communicate with the perineal side.  A curette was inserted into the perineal side of the fistula tract and the tract was gently curetted.  This was repeated on the rectal side.   Tisseel was then inserted into the perineal fistula tract until the glue began expelling through the opening.  This was then repeated on the rectal side tract.  Hemostasis was noted all and all instruments were removed.  Patient tolerated the procedure well and was awakened and taken to the recovery room in stable condition. All counts were correct x2.   Jaquita Folds, MD

## 2022-03-05 NOTE — Anesthesia Preprocedure Evaluation (Signed)
Anesthesia Evaluation  Patient identified by MRN, date of birth, ID band Patient awake    Reviewed: Allergy & Precautions, NPO status , Patient's Chart, lab work & pertinent test results  Airway Mallampati: II  TM Distance: >3 FB Neck ROM: Full    Dental no notable dental hx.    Pulmonary neg pulmonary ROS,    Pulmonary exam normal breath sounds clear to auscultation       Cardiovascular negative cardio ROS Normal cardiovascular exam Rhythm:Regular Rate:Normal     Neuro/Psych Anxiety Depression negative neurological ROS  negative psych ROS   GI/Hepatic Neg liver ROS, GERD  ,  Endo/Other  PCOS  Renal/GU negative Renal ROS  negative genitourinary   Musculoskeletal negative musculoskeletal ROS (+)   Abdominal (+) + obese,   Peds negative pediatric ROS (+)  Hematology negative hematology ROS (+)   Anesthesia Other Findings   Reproductive/Obstetrics negative OB ROS                             Anesthesia Physical  Anesthesia Plan  ASA: 2  Anesthesia Plan: MAC   Post-op Pain Management: Tylenol PO (pre-op)*   Induction: Intravenous  PONV Risk Score and Plan: 2 and Propofol infusion, Treatment may vary due to age or medical condition and Ondansetron  Airway Management Planned: Simple Face Mask  Additional Equipment:   Intra-op Plan:   Post-operative Plan:   Informed Consent: I have reviewed the patients History and Physical, chart, labs and discussed the procedure including the risks, benefits and alternatives for the proposed anesthesia with the patient or authorized representative who has indicated his/her understanding and acceptance.     Dental advisory given  Plan Discussed with: CRNA and Surgeon  Anesthesia Plan Comments:         Anesthesia Quick Evaluation

## 2022-03-05 NOTE — Interval H&P Note (Signed)
History and Physical Interval Note:  03/05/2022 8:46 AM  Jamie Nelson  has presented today for surgery, with the diagnosis of female genital tract-skin fistulae.  The various methods of treatment have been discussed with the patient and family. After consideration of risks, benefits and other options for treatment, the patient has consented to  Procedure(s): FISTULA REPAIR-PERINEAL with fibrin glue (N/A) as a surgical intervention.  The patient's history has been reviewed, patient examined, no change in status, stable for surgery.  I have reviewed the patient's chart and labs.  Questions were answered to the patient's satisfaction.     Jaquita Folds

## 2022-03-05 NOTE — Anesthesia Postprocedure Evaluation (Signed)
Anesthesia Post Note  Patient: Jamie Nelson  Procedure(s) Performed: PERINEAL FISTULA REPAIR WITH FIBRIN GLUE (Perineum)     Patient location during evaluation: PACU Anesthesia Type: MAC Level of consciousness: awake and alert Pain management: pain level controlled Vital Signs Assessment: post-procedure vital signs reviewed and stable Respiratory status: spontaneous breathing, nonlabored ventilation and respiratory function stable Cardiovascular status: blood pressure returned to baseline and stable Postop Assessment: no apparent nausea or vomiting Anesthetic complications: no   No notable events documented.  Last Vitals:  Vitals:   03/05/22 1130 03/05/22 1159  BP: (!) 121/58 127/80  Pulse: (!) 54 (!) 56  Resp: 13 18  Temp: (!) 36.3 C 36.6 C  SpO2: 100% 97%    Last Pain:  Vitals:   03/05/22 1159  TempSrc:   PainSc: 0-No pain                 Lynda Rainwater

## 2022-03-05 NOTE — Discharge Instructions (Addendum)
Can return to normal activity on post operative day 1.   No intercourse, tampons or tub baths for two weeks.     No acetaminophen/Tylenol until after 2:30pm today if needed for pain.     Post Anesthesia Home Care Instructions  Activity: Get plenty of rest for the remainder of the day. A responsible individual must stay with you for 24 hours following the procedure.  For the next 24 hours, DO NOT: -Drive a car -Paediatric nurse -Drink alcoholic beverages -Take any medication unless instructed by your physician -Make any legal decisions or sign important papers.  Meals: Start with liquid foods such as gelatin or soup. Progress to regular foods as tolerated. Avoid greasy, spicy, heavy foods. If nausea and/or vomiting occur, drink only clear liquids until the nausea and/or vomiting subsides. Call your physician if vomiting continues.  Special Instructions/Symptoms: Your throat may feel dry or sore from the anesthesia or the breathing tube placed in your throat during surgery. If this causes discomfort, gargle with warm salt water. The discomfort should disappear within 24 hours.

## 2022-03-06 ENCOUNTER — Encounter (HOSPITAL_BASED_OUTPATIENT_CLINIC_OR_DEPARTMENT_OTHER): Payer: Self-pay | Admitting: Obstetrics and Gynecology

## 2022-03-09 NOTE — Progress Notes (Signed)
TeleHealth Visit:  This visit was completed with telemedicine (audio/video) technology. Jamie Nelson has verbally consented to this TeleHealth visit. The patient is located at home, the provider is located at home. The participants in this visit include the listed provider and patient. The visit was conducted today via MyChart video.  OBESITY Jamie Nelson is here to discuss her progress with her obesity treatment plan along with follow-up of her obesity related diagnoses.   Today's visit was # 6 Starting weight: 215 lbs Starting date: 09/10/2021 Weight at last in office visit: 202 lbs on 02/05/22 Total weight loss: 13 lbs at last in office visit on 02/05/22. Today's reported weight: 202 lbs   Nutrition Plan: keeping a food journal and adhering to recommended goals of 1200-1350 calories and 90 + grams protein or Category 2 Plan.  Current exercise: not recently  Interim History: Jamie Nelson had a perineal fistula repair on 03/05/22 so she has not been able to exercise.  She plans on beginning training soon for a 28 mile hike for a charity next spring. Her mother lives with her currently which is an added stressor.  Her mother is helping with childcare and cooking.  However she tends to cook casserole type dishes. [name] usually journals about 5 days/week and calories and protein are usually close to goal.  She has about 2 meals per week that are off plan.  Notes some increased hunger after vigorous exercise and around her menses.  She will go be going to [place] with one of her daughters late November for her birthday celebration.  Assessment/Plan:  1. Eating disorder/emotional eating Well-controlled for the most part.  On bupropion 100 mg twice daily.  She does not always take the second dose because she does not feel she needs it.  She feels the bupropion has been beneficial for her mood overall.  Notes boost in focus and motivation when she takes it. Sees therapist-Melissa Mekita.  Feels this  has been beneficial with dealing with the situation with her mother.  Plan: Refill bupropion 100 mg twice daily. Follow-up with counselor as directed.  2. Vitamin D Deficiency Vitamin D is not at goal of 50.  Vitamin D level was low at 22.9 on 09/10/2021. She is on weekly prescription Vitamin D 50,000 IU.  Lab Results  Component Value Date   VD25OH 22.9 (L) 09/10/2021   VD25OH 24.60 (L) 11/21/2020   VD25OH 31.12 05/03/2018    Plan: Refill prescription vitamin D 50,000 IU weekly.   3. Obesity: Current BMI 34.7 Aniyia is currently in the action stage of change. As such, her goal is to continue with weight loss efforts.  She has agreed to keeping a food journal and adhering to recommended goals of 1200-1400 calories and 90+ gms protein daily.   1.  Handout sent via MyChart-recipes, dining out guide. 2.  On days with vigorous exercise she should eat that the top of her calorie range if she has polyphagia.  Exercise goals: Resume walking/hiking as tolerated.  Behavioral modification strategies: increasing lean protein intake, decreasing simple carbohydrates, meal planning and cooking strategies, and planning for success.  [name] has agreed to follow-up with our clinic in 3 weeks.   No orders of the defined types were placed in this encounter.   Medications Discontinued During This Encounter  Medication Reason   Vitamin D, Ergocalciferol, (DRISDOL) 1.25 MG (50000 UNIT) CAPS capsule Reorder   buPROPion ER (WELLBUTRIN SR) 100 MG 12 hr tablet Reorder     Meds ordered this  encounter  Medications   Vitamin D, Ergocalciferol, (DRISDOL) 1.25 MG (50000 UNIT) CAPS capsule    Sig: Take 1 capsule (50,000 Units total) by mouth every 7 (seven) days.    Dispense:  4 capsule    Refill:  0    Order Specific Question:   Supervising Provider    Answer:   Glennis Brink [[id number]]   buPROPion ER (WELLBUTRIN SR) 100 MG 12 hr tablet    Sig: Take 1 tablet (100 mg total) by mouth 2 (two) times daily.     Dispense:  60 tablet    Refill:  0    Order Specific Question:   Supervising Provider    Answer:   Glennis Brink [[id number]]      Objective:   VITALS: Per patient if applicable, see vitals. GENERAL: Alert and in no acute distress. CARDIOPULMONARY: No increased WOB. Speaking in clear sentences.  PSYCH: Pleasant and cooperative. Speech normal rate and rhythm. Affect is appropriate. Insight and judgement are appropriate. Attention is focused, linear, and appropriate.  NEURO: Oriented as arrived to appointment on time with no prompting.   Lab Results  Component Value Date   CREATININE 0.84 09/10/2021   BUN 12 09/10/2021   NA 139 09/10/2021   K 4.3 09/10/2021   CL 100 09/10/2021   CO2 22 09/10/2021   Lab Results  Component Value Date   ALT 42 (H) 09/10/2021   AST 33 09/10/2021   ALKPHOS 130 (H) 09/10/2021   BILITOT 0.5 09/10/2021   Lab Results  Component Value Date   HGBA1C 5.1 09/10/2021   HGBA1C 5.1 05/03/2018   HGBA1C 5.0 12/22/2016   HGBA1C 5.0 03/30/2016   Lab Results  Component Value Date   INSULIN 7.5 09/10/2021   Lab Results  Component Value Date   TSH 1.290 09/10/2021   Lab Results  Component Value Date   CHOL 245 (H) 09/10/2021   HDL 70 09/10/2021   LDLCALC 145 (H) 09/10/2021   LDLDIRECT 132.6 06/26/2011   TRIG 167 (H) 09/10/2021   CHOLHDL 4 11/21/2020   Lab Results  Component Value Date   WBC 7.0 09/10/2021   HGB 14.5 09/10/2021   HCT 42.3 09/10/2021   MCV 90 09/10/2021   PLT 250 09/10/2021   No results found for: "IRON", "TIBC", "FERRITIN" Lab Results  Component Value Date   VD25OH 22.9 (L) 09/10/2021   VD25OH 24.60 (L) 11/21/2020   VD25OH 31.12 05/03/2018    Attestation Statements:   Reviewed by clinician on day of visit: allergies, medications, problem list, medical history, surgical history, family history, social history, and previous encounter notes.

## 2022-03-10 ENCOUNTER — Other Ambulatory Visit (HOSPITAL_COMMUNITY): Payer: Self-pay

## 2022-03-10 ENCOUNTER — Encounter (INDEPENDENT_AMBULATORY_CARE_PROVIDER_SITE_OTHER): Payer: Self-pay | Admitting: Family Medicine

## 2022-03-10 ENCOUNTER — Telehealth (INDEPENDENT_AMBULATORY_CARE_PROVIDER_SITE_OTHER): Payer: 59 | Admitting: Family Medicine

## 2022-03-10 DIAGNOSIS — E559 Vitamin D deficiency, unspecified: Secondary | ICD-10-CM | POA: Diagnosis not present

## 2022-03-10 DIAGNOSIS — F5089 Other specified eating disorder: Secondary | ICD-10-CM

## 2022-03-10 DIAGNOSIS — Z6834 Body mass index (BMI) 34.0-34.9, adult: Secondary | ICD-10-CM

## 2022-03-10 DIAGNOSIS — E669 Obesity, unspecified: Secondary | ICD-10-CM | POA: Diagnosis not present

## 2022-03-10 MED ORDER — VITAMIN D (ERGOCALCIFEROL) 1.25 MG (50000 UNIT) PO CAPS
50000.0000 [IU] | ORAL_CAPSULE | ORAL | 0 refills | Status: DC
  Filled 2022-03-10: qty 4, 28d supply, fill #0

## 2022-03-10 MED ORDER — BUPROPION HCL ER (SR) 100 MG PO TB12
100.0000 mg | ORAL_TABLET | Freq: Two times a day (BID) | ORAL | 0 refills | Status: DC
  Filled 2022-03-10: qty 60, 30d supply, fill #0

## 2022-03-12 DIAGNOSIS — F411 Generalized anxiety disorder: Secondary | ICD-10-CM | POA: Diagnosis not present

## 2022-03-13 ENCOUNTER — Encounter: Payer: Self-pay | Admitting: *Deleted

## 2022-03-17 ENCOUNTER — Other Ambulatory Visit (HOSPITAL_COMMUNITY): Payer: Self-pay

## 2022-03-24 DIAGNOSIS — F411 Generalized anxiety disorder: Secondary | ICD-10-CM | POA: Diagnosis not present

## 2022-04-02 ENCOUNTER — Ambulatory Visit (INDEPENDENT_AMBULATORY_CARE_PROVIDER_SITE_OTHER): Payer: 59 | Admitting: Family Medicine

## 2022-04-03 ENCOUNTER — Encounter: Payer: Self-pay | Admitting: Obstetrics and Gynecology

## 2022-04-03 ENCOUNTER — Ambulatory Visit (INDEPENDENT_AMBULATORY_CARE_PROVIDER_SITE_OTHER): Payer: 59 | Admitting: Obstetrics and Gynecology

## 2022-04-03 VITALS — BP 135/76 | HR 71

## 2022-04-03 DIAGNOSIS — H5213 Myopia, bilateral: Secondary | ICD-10-CM | POA: Diagnosis not present

## 2022-04-03 DIAGNOSIS — H52223 Regular astigmatism, bilateral: Secondary | ICD-10-CM | POA: Diagnosis not present

## 2022-04-03 DIAGNOSIS — K604 Rectal fistula: Secondary | ICD-10-CM

## 2022-04-03 NOTE — Progress Notes (Signed)
[facility]  Date of Visit: 04/03/2022  History of Present Illness: Ms. Jamie Nelson is a 41 y.o. female scheduled today for a post-operative visit.   Surgery: s/p repair of perineal fistula with fibrin glue on 03/05/22  She reports that the glue came out after a week and she has not had any improvement in her symptoms. Still seeing discharge and blood from the perineal site.   Medications: She has a current medication list which includes the following prescription(s): bupropion er, cetirizine, ibuprofen, metformin, omeprazole, sertraline, spironolactone, tretinoin, and vitamin d (ergocalciferol).   Allergies: Patient is allergic to milk-related compounds.   Physical Exam: BP 135/76   Pulse 71    Pelvic Examination: Pinpoint area at 7 o'clock from the introitus lateral to the perineal body. Rectal exam shows intact sphincter, good tone.    ---------------------------------------------------------  Assessment and Plan:  1. Rectal fistula     - We discussed that during the surgery, based on prior imaging, we expected a vaginal- fistula but instead a recto-perineal fistula was present. It is possible that the tract from the vagina healed with her prior surgery but a new tract formed to the rectum.  - At the time of surgery, she did not want an invasive repair so we attempted fibrin glue placement, but this seems to have failed. Would recommend a traditional repair as the discharge from the perineal site is still very bothersome for her.  -  Would benefit from a rectal approach of the fistula repair due to the fistula location. Therefore, will refer her to colorectal surgery.  - For skin irritation, can use coloplast barrier cream  Return as needed  Jaquita Folds, MD

## 2022-04-07 ENCOUNTER — Telehealth (INDEPENDENT_AMBULATORY_CARE_PROVIDER_SITE_OTHER): Payer: 59 | Admitting: Family Medicine

## 2022-04-07 ENCOUNTER — Other Ambulatory Visit (HOSPITAL_COMMUNITY): Payer: Self-pay

## 2022-04-07 ENCOUNTER — Encounter (INDEPENDENT_AMBULATORY_CARE_PROVIDER_SITE_OTHER): Payer: Self-pay | Admitting: Family Medicine

## 2022-04-07 DIAGNOSIS — E669 Obesity, unspecified: Secondary | ICD-10-CM

## 2022-04-07 DIAGNOSIS — F3289 Other specified depressive episodes: Secondary | ICD-10-CM

## 2022-04-07 DIAGNOSIS — Z6834 Body mass index (BMI) 34.0-34.9, adult: Secondary | ICD-10-CM

## 2022-04-07 DIAGNOSIS — E559 Vitamin D deficiency, unspecified: Secondary | ICD-10-CM

## 2022-04-07 DIAGNOSIS — F509 Eating disorder, unspecified: Secondary | ICD-10-CM | POA: Insufficient documentation

## 2022-04-07 MED ORDER — BUPROPION HCL ER (SR) 100 MG PO TB12
100.0000 mg | ORAL_TABLET | Freq: Two times a day (BID) | ORAL | 0 refills | Status: DC
  Filled 2022-04-07: qty 60, 30d supply, fill #0

## 2022-04-07 MED ORDER — VITAMIN D (ERGOCALCIFEROL) 1.25 MG (50000 UNIT) PO CAPS
50000.0000 [IU] | ORAL_CAPSULE | ORAL | 0 refills | Status: DC
  Filled 2022-04-07: qty 4, 28d supply, fill #0

## 2022-04-17 NOTE — Progress Notes (Signed)
TeleHealth Visit:  Due to the COVID-19 pandemic, this visit was completed with telemedicine (audio/video) technology to reduce patient and provider exposure as well as to preserve personal protective equipment.   Jamie Nelson has verbally consented to this TeleHealth visit. The patient is located at home, the provider is located at the [facility] office. The participants in this visit include the listed provider and patient.  The visit was conducted today via video.  Chief Complaint: OBESITY Jamie Nelson is here to discuss her progress with her obesity treatment plan along with follow-up of her obesity related diagnoses. Jamie Nelson is on keeping a food journal and adhering to recommended goals of 1200-1400 calories and 90 protein and states she is following her eating plan approximately 50% of the time. Jamie Nelson states she is walking some.  Today's visit was #: 7 Starting weight: 215 lbs Starting date: 09/10/2021  Interim History: She had Uro-GYN procedure done, which was not successful.  Has not been as active.  Has chaffing with exercise.  Using lose it app to  log intake.  Got off track with Halloween.  Work leads to skipping meals.   Subjective:   1. Vitamin D deficiency She is currently taking prescription vitamin D 50,000 IU each week. She denies nausea, vomiting or muscle weakness.  2. Other depression with emotional eating Talking Wellbutrin SR 100 mg BID, sometimes forgets the 2nd dose. Helps with inattentives and mood.   Assessment/Plan:   1. Vitamin D deficiency Recheck level in 4 weeks.   Refill - Vitamin D, Ergocalciferol, (DRISDOL) 1.25 MG (50000 UNIT) CAPS capsule; Take 1 capsule (50,000 Units total) by mouth every 7 (seven) days.  Dispense: 4 capsule; Refill: 0  2. Other depression with emotional eating Refill - buPROPion ER (WELLBUTRIN SR) 100 MG 12 hr tablet; Take 1 tablet (100 mg total) by mouth 2 (two) times daily.  Dispense: 60 tablet; Refill: 0  3.  Obesity,current BMI 34.67 Discussed  easy meal options for work.   Jamie Nelson is currently in the action stage of change. As such, her goal is to continue with weight loss efforts. She has agreed to keeping a food journal and adhering to recommended goals of 1400 calories and 90 protein.   Exercise goals:  As is.   Behavioral modification strategies: increasing lean protein intake, increasing water intake, no skipping meals, meal planning and cooking strategies, keeping healthy foods in the home, better snacking choices, holiday eating strategies , and decreasing junk food.  Jamie Nelson has agreed to follow-up with our clinic in 4 weeks. She was informed of the importance of frequent follow-up visits to maximize her success with intensive lifestyle modifications for her multiple health conditions.  Objective:   VITALS: Per patient if applicable, see vitals. GENERAL: Alert and in no acute distress. CARDIOPULMONARY: No increased WOB. Speaking in clear sentences.  PSYCH: Pleasant and cooperative. Speech normal rate and rhythm. Affect is appropriate. Insight and judgement are appropriate. Attention is focused, linear, and appropriate.  NEURO: Oriented as arrived to appointment on time with no prompting.   Lab Results  Component Value Date   CREATININE 0.84 09/10/2021   BUN 12 09/10/2021   NA 139 09/10/2021   K 4.3 09/10/2021   CL 100 09/10/2021   CO2 22 09/10/2021   Lab Results  Component Value Date   ALT 42 (H) 09/10/2021   AST 33 09/10/2021   ALKPHOS 130 (H) 09/10/2021   BILITOT 0.5 09/10/2021   Lab Results  Component Value Date  HGBA1C 5.1 09/10/2021   HGBA1C 5.1 05/03/2018   HGBA1C 5.0 12/22/2016   HGBA1C 5.0 03/30/2016   Lab Results  Component Value Date   INSULIN 7.5 09/10/2021   Lab Results  Component Value Date   TSH 1.290 09/10/2021   Lab Results  Component Value Date   CHOL 245 (H) 09/10/2021   HDL 70 09/10/2021   LDLCALC 145 (H) 09/10/2021   LDLDIRECT 132.6  06/26/2011   TRIG 167 (H) 09/10/2021   CHOLHDL 4 11/21/2020   Lab Results  Component Value Date   VD25OH 22.9 (L) 09/10/2021   VD25OH 24.60 (L) 11/21/2020   VD25OH 31.12 05/03/2018   Lab Results  Component Value Date   WBC 7.0 09/10/2021   HGB 14.5 09/10/2021   HCT 42.3 09/10/2021   MCV 90 09/10/2021   PLT 250 09/10/2021   No results found for: "IRON", "TIBC", "FERRITIN"  Attestation Statements:   Reviewed by clinician on day of visit: allergies, medications, problem list, medical history, surgical history, family history, social history, and previous encounter notes.  I, Davy Pique, am acting as Location manager for Loyal Gambler, DO.  I have reviewed the above documentation for accuracy and completeness, and I agree with the above. Dell Ponto, DO

## 2022-04-28 DIAGNOSIS — F411 Generalized anxiety disorder: Secondary | ICD-10-CM | POA: Diagnosis not present

## 2022-04-29 ENCOUNTER — Ambulatory Visit: Payer: 59 | Admitting: Obstetrics and Gynecology

## 2022-05-14 ENCOUNTER — Ambulatory Visit (INDEPENDENT_AMBULATORY_CARE_PROVIDER_SITE_OTHER): Payer: 59 | Admitting: Family Medicine

## 2022-05-27 DIAGNOSIS — F411 Generalized anxiety disorder: Secondary | ICD-10-CM | POA: Diagnosis not present

## 2022-05-28 DIAGNOSIS — F411 Generalized anxiety disorder: Secondary | ICD-10-CM | POA: Diagnosis not present

## 2022-06-02 ENCOUNTER — Ambulatory Visit (INDEPENDENT_AMBULATORY_CARE_PROVIDER_SITE_OTHER): Payer: 59 | Admitting: Family Medicine

## 2022-06-10 ENCOUNTER — Telehealth: Payer: 59 | Admitting: Family

## 2022-06-10 ENCOUNTER — Other Ambulatory Visit (HOSPITAL_COMMUNITY): Payer: Self-pay

## 2022-06-10 DIAGNOSIS — R399 Unspecified symptoms and signs involving the genitourinary system: Secondary | ICD-10-CM | POA: Diagnosis not present

## 2022-06-10 MED ORDER — CEPHALEXIN 500 MG PO CAPS
500.0000 mg | ORAL_CAPSULE | Freq: Two times a day (BID) | ORAL | 0 refills | Status: DC
  Filled 2022-06-10: qty 14, 7d supply, fill #0

## 2022-06-10 NOTE — Progress Notes (Signed)

## 2022-06-11 ENCOUNTER — Other Ambulatory Visit (HOSPITAL_COMMUNITY): Payer: Self-pay

## 2022-06-12 ENCOUNTER — Other Ambulatory Visit: Payer: Self-pay | Admitting: Family Medicine

## 2022-06-12 ENCOUNTER — Other Ambulatory Visit (HOSPITAL_COMMUNITY): Payer: Self-pay

## 2022-06-12 ENCOUNTER — Other Ambulatory Visit: Payer: Self-pay

## 2022-06-12 ENCOUNTER — Other Ambulatory Visit (INDEPENDENT_AMBULATORY_CARE_PROVIDER_SITE_OTHER): Payer: Self-pay | Admitting: Family Medicine

## 2022-06-12 DIAGNOSIS — E282 Polycystic ovarian syndrome: Secondary | ICD-10-CM

## 2022-06-12 DIAGNOSIS — E559 Vitamin D deficiency, unspecified: Secondary | ICD-10-CM

## 2022-06-12 MED ORDER — SPIRONOLACTONE 50 MG PO TABS
50.0000 mg | ORAL_TABLET | Freq: Two times a day (BID) | ORAL | 0 refills | Status: DC
  Filled 2022-06-12: qty 60, 30d supply, fill #0

## 2022-06-12 NOTE — Telephone Encounter (Signed)
Pt has not been seen in over a year.

## 2022-06-15 ENCOUNTER — Other Ambulatory Visit (HOSPITAL_COMMUNITY): Payer: Self-pay

## 2022-06-23 DIAGNOSIS — F411 Generalized anxiety disorder: Secondary | ICD-10-CM | POA: Diagnosis not present

## 2022-06-24 ENCOUNTER — Ambulatory Visit (INDEPENDENT_AMBULATORY_CARE_PROVIDER_SITE_OTHER): Payer: 59 | Admitting: Family Medicine

## 2022-06-24 ENCOUNTER — Other Ambulatory Visit (HOSPITAL_COMMUNITY): Payer: Self-pay

## 2022-06-24 ENCOUNTER — Encounter (INDEPENDENT_AMBULATORY_CARE_PROVIDER_SITE_OTHER): Payer: Self-pay | Admitting: Family Medicine

## 2022-06-24 VITALS — BP 126/79 | HR 64 | Temp 98.8°F | Ht 64.0 in

## 2022-06-24 DIAGNOSIS — R5383 Other fatigue: Secondary | ICD-10-CM | POA: Diagnosis not present

## 2022-06-24 DIAGNOSIS — Z6835 Body mass index (BMI) 35.0-35.9, adult: Secondary | ICD-10-CM | POA: Diagnosis not present

## 2022-06-24 DIAGNOSIS — E559 Vitamin D deficiency, unspecified: Secondary | ICD-10-CM | POA: Diagnosis not present

## 2022-06-24 DIAGNOSIS — E282 Polycystic ovarian syndrome: Secondary | ICD-10-CM

## 2022-06-24 DIAGNOSIS — E66812 Obesity, class 2: Secondary | ICD-10-CM

## 2022-06-24 DIAGNOSIS — E7800 Pure hypercholesterolemia, unspecified: Secondary | ICD-10-CM

## 2022-06-24 DIAGNOSIS — F3289 Other specified depressive episodes: Secondary | ICD-10-CM | POA: Diagnosis not present

## 2022-06-24 MED ORDER — WEGOVY 0.25 MG/0.5ML ~~LOC~~ SOAJ
0.2500 mg | SUBCUTANEOUS | 0 refills | Status: DC
  Filled 2022-06-24 – 2022-06-30 (×3): qty 2, 28d supply, fill #0

## 2022-06-24 MED ORDER — VITAMIN D (ERGOCALCIFEROL) 1.25 MG (50000 UNIT) PO CAPS
50000.0000 [IU] | ORAL_CAPSULE | ORAL | 0 refills | Status: DC
  Filled 2022-06-24: qty 4, 28d supply, fill #0

## 2022-06-24 MED ORDER — BUPROPION HCL ER (SR) 100 MG PO TB12
100.0000 mg | ORAL_TABLET | Freq: Two times a day (BID) | ORAL | 0 refills | Status: DC
  Filled 2022-06-24: qty 60, 30d supply, fill #0

## 2022-06-25 ENCOUNTER — Other Ambulatory Visit (HOSPITAL_COMMUNITY): Payer: Self-pay

## 2022-06-25 DIAGNOSIS — E7849 Other hyperlipidemia: Secondary | ICD-10-CM | POA: Insufficient documentation

## 2022-06-25 DIAGNOSIS — F32A Depression, unspecified: Secondary | ICD-10-CM | POA: Insufficient documentation

## 2022-06-25 DIAGNOSIS — R5383 Other fatigue: Secondary | ICD-10-CM | POA: Insufficient documentation

## 2022-06-25 DIAGNOSIS — E7800 Pure hypercholesterolemia, unspecified: Secondary | ICD-10-CM | POA: Insufficient documentation

## 2022-06-25 LAB — COMPREHENSIVE METABOLIC PANEL
ALT: 31 IU/L (ref 0–32)
AST: 19 IU/L (ref 0–40)
Albumin/Globulin Ratio: 1.8 (ref 1.2–2.2)
Albumin: 4.4 g/dL (ref 3.9–4.9)
Alkaline Phosphatase: 111 IU/L (ref 44–121)
BUN/Creatinine Ratio: 11 (ref 9–23)
BUN: 10 mg/dL (ref 6–24)
Bilirubin Total: 0.4 mg/dL (ref 0.0–1.2)
CO2: 20 mmol/L (ref 20–29)
Calcium: 9.5 mg/dL (ref 8.7–10.2)
Chloride: 101 mmol/L (ref 96–106)
Creatinine, Ser: 0.89 mg/dL (ref 0.57–1.00)
Globulin, Total: 2.5 g/dL (ref 1.5–4.5)
Glucose: 73 mg/dL (ref 70–99)
Potassium: 4.5 mmol/L (ref 3.5–5.2)
Sodium: 138 mmol/L (ref 134–144)
Total Protein: 6.9 g/dL (ref 6.0–8.5)
eGFR: 83 mL/min/{1.73_m2} (ref >59)

## 2022-06-25 LAB — LIPID PANEL
Chol/HDL Ratio: 3.5 ratio (ref 0.0–4.4)
Cholesterol, Total: 223 mg/dL — ABNORMAL HIGH (ref 100–199)
HDL: 64 mg/dL (ref >39)
LDL Chol Calc (NIH): 141 mg/dL — ABNORMAL HIGH (ref 0–99)
Triglycerides: 103 mg/dL (ref 0–149)
VLDL Cholesterol Cal: 18 mg/dL (ref 5–40)

## 2022-06-25 LAB — VITAMIN D 25 HYDROXY (VIT D DEFICIENCY, FRACTURES): Vit D, 25-Hydroxy: 30.7 ng/mL (ref 30.0–100.0)

## 2022-06-29 ENCOUNTER — Telehealth (INDEPENDENT_AMBULATORY_CARE_PROVIDER_SITE_OTHER): Payer: Self-pay | Admitting: Family Medicine

## 2022-06-29 NOTE — Telephone Encounter (Signed)
PA SUBMITTED VIA [employer]OVER[employer]MEDS  Shreshta Goshorn (Key: [id number])  Your information has been sent to [place].

## 2022-06-30 ENCOUNTER — Other Ambulatory Visit (HOSPITAL_COMMUNITY): Payer: Self-pay

## 2022-06-30 ENCOUNTER — Other Ambulatory Visit: Payer: Self-pay

## 2022-07-01 NOTE — Telephone Encounter (Signed)
Message from plan: The request has been approved.  The authorization is effective from 06/30/2022 to 01/19/2023, as long as the member is enrolled in their current health plan. The request was approved as submitted. This request has been approved for 48m per 28 days.A second prior authorization [id number] been entered for [facility].'5mg'$ /0.55m this request is effective from 06/30/2022 and is valid until 01/19/2023 and is limited to 62m73mer 28 days.A third prior authorization [id number] been entered for [facility]'$ /0.5mL63mhis request is effective from 06/30/2022 and is valid until 01/19/2023 and is limited to 62mL 77m 28 days.A fourth prior authorization [id number] entered for [facility]'7mg'$ /0.75mL,109ms request is effective from 06/30/2022 and is valid until 01/19/2023 and is limited to 3mL pe81m8 days.A fifth prior authorization [id number] entered for [facility]'4mg'$ /0.75mL, t54mrequest is effective from 06/30/2022 and is valid until 01/19/2023 and is limited to 3mL per 27mdays. A written notification letter will follow with additional details.

## 2022-07-11 DIAGNOSIS — N390 Urinary tract infection, site not specified: Secondary | ICD-10-CM | POA: Diagnosis not present

## 2022-07-16 NOTE — Progress Notes (Signed)
Chief Complaint:   OBESITY Jamie Nelson is here to discuss her progress with her obesity treatment plan along with follow-up of her obesity related diagnoses. Jamie Nelson is on keeping a food journal and adhering to recommended goals of 1400 calories and 90 grams protein and states she is following her eating plan approximately 25-30% of the time. Jamie Nelson states she is walking and hiking 30-45 minutes 2-3 times per week.  Today's visit was #: 8 Starting weight: 215 lbs Starting date: 09/10/2021 Today's weight: 207 lbs Today's date: 06/24/2022 Total lbs lost to date: 8 Total lbs lost since last in-office visit: +5  Interim History: Net weight loss: 8 lb in 9 months = 3.7% total body weight loss. Went through fistula procedure and needs to have another surgery. Pt had to go to [place] a few times to see her mom following hip surgery. She hasn't had much time for exercise. Pt is motivated to get back on meal plan. She had nausea on Saxenda in the past.  Subjective:   1. PCOS (polycystic ovarian syndrome) Jamie Nelson is taking Metformin 500 mg 2 tabs BID and spironolactone 50 mg BID. Struggling to comply with a low sugar/low carb diet due to stress and time.  2. Other depression with emotional eating She is taking Wellbutrin SR 100 mg BID- helping. She has good support at home. Her mom will be coming to live with her soon.  3. Vitamin D deficiency Taking prescription Vitamin D 50,000 IU weekly. 09/10/2021 Vitamin D 22.9 Her energy level is fair.  4. Other fatigue Symptoms likely related to high stress, lack of sleep, poor diet, and lack of exercise.  5. Elevated cholesterol On 09/10/2021 Keron's total cholesterol was 245, LDL of 145, HDL 70, and triglycerides 167.  Assessment/Plan:   1. PCOS (polycystic ovarian syndrome) Continue current meds. Focus on eating on a schedule- protein and fiber with meals, stress reduction.  2. Other depression with emotional eating Practice mindful  eating. Continue Wellbutrin SR 100 mg.  Refill- buPROPion ER (WELLBUTRIN SR) 100 MG 12 hr tablet; Take 1 tablet (100 mg total) by mouth 2 (two) times daily.  Dispense: 60 tablet; Refill: 0  3. Vitamin D deficiency Check Vitamin D level today- goal is 50-70.  Refill- Vitamin D, Ergocalciferol, (DRISDOL) 1.25 MG (50000 UNIT) CAPS capsule; Take 1 capsule (50,000 Units total) by mouth every 7 (seven) days.  Dispense: 4 capsule; Refill: 0  - VITAMIN D 25 Hydroxy (Vit-D Deficiency, Fractures)  4. Other fatigue Check CMP with labs.  - Comprehensive metabolic panel  5. Elevated cholesterol Recheck fasting lipid panel to look for LDL improvements.  - Lipid panel  6. Morbid obesity ([place]) 7. Obesity, current BMI 35.6, Adult Resume category 2 meal plan. Begin Wegovy 0.25 mg weekly Start- Semaglutide-Weight Management (WEGOVY) 0.25 MG/0.5ML SOAJ; Inject 0.25 mg into the skin once a week.  Dispense: 2 mL; Refill: 0 Patient denies a personal or family history of pancreatitis, medullary thyroid carcinoma or multiple endocrine neoplasia type II. Recommend reviewing pen training video online.   Jamie Nelson is currently in the action stage of change. As such, her goal is to continue with weight loss efforts. She has agreed to the Category 2 Plan.   Exercise goals:  Increase walking to 3+ days a week.  Behavioral modification strategies: increasing lean protein intake, increasing water intake, meal planning and cooking strategies, keeping healthy foods in the home, emotional eating strategies, planning for success, and decreasing junk food.  Jamie Nelson has agreed to  follow-up with our clinic in 4 weeks. She was informed of the importance of frequent follow-up visits to maximize her success with intensive lifestyle modifications for her multiple health conditions.   Objective:   Blood pressure 126/79, pulse 64, temperature 98.8 F (37.1 C), height 5' 4"$  (1.626 m), SpO2 100 %. Body mass index is 34.86  kg/m.  General: Cooperative, alert, well developed, in no acute distress. HEENT: Conjunctivae and lids unremarkable. Cardiovascular: Regular rhythm.  Lungs: Normal work of breathing. Neurologic: No focal deficits.   Lab Results  Component Value Date   CREATININE 0.89 06/24/2022   BUN 10 06/24/2022   NA 138 06/24/2022   K 4.5 06/24/2022   CL 101 06/24/2022   CO2 20 06/24/2022   Lab Results  Component Value Date   ALT 31 06/24/2022   AST 19 06/24/2022   ALKPHOS 111 06/24/2022   BILITOT 0.4 06/24/2022   Lab Results  Component Value Date   HGBA1C 5.1 09/10/2021   HGBA1C 5.1 05/03/2018   HGBA1C 5.0 12/22/2016   HGBA1C 5.0 03/30/2016   Lab Results  Component Value Date   INSULIN 7.5 09/10/2021   Lab Results  Component Value Date   TSH 1.290 09/10/2021   Lab Results  Component Value Date   CHOL 223 (H) 06/24/2022   HDL 64 06/24/2022   LDLCALC 141 (H) 06/24/2022   LDLDIRECT 132.6 06/26/2011   TRIG 103 06/24/2022   CHOLHDL 3.5 06/24/2022   Lab Results  Component Value Date   VD25OH 30.7 06/24/2022   VD25OH 22.9 (L) 09/10/2021   VD25OH 24.60 (L) 11/21/2020   Lab Results  Component Value Date   WBC 7.0 09/10/2021   HGB 14.5 09/10/2021   HCT 42.3 09/10/2021   MCV 90 09/10/2021   PLT 250 09/10/2021   No results found for: "IRON", "TIBC", "FERRITIN"    Attestation Statements:   Reviewed by clinician on day of visit: allergies, medications, problem list, medical history, surgical history, family history, social history, and previous encounter notes.  I, Kathlene November, BS, CMA, am acting as transcriptionist for Loyal Gambler, DO.   I have reviewed the above documentation for accuracy and completeness, and I agree with the above. Dell Ponto, DO

## 2022-07-17 ENCOUNTER — Other Ambulatory Visit (HOSPITAL_COMMUNITY): Payer: Self-pay

## 2022-07-20 NOTE — Progress Notes (Signed)
TeleHealth Visit:  This visit was completed with telemedicine (audio/video) technology. Jamie Nelson has verbally consented to this TeleHealth visit. The patient is located at home, the provider is located at home. The participants in this visit include the listed provider and patient. The visit was conducted today via MyChart video.  OBESITY Jamie Nelson is here to discuss her progress with her obesity treatment plan along with follow-up of her obesity related diagnoses.   Today's visit was # 9 Starting weight: 215 lbs Starting date: 09/10/2021 Weight at last in office visit: 207 lbs on 06/24/22 Total weight loss: 8 lbs at last in office visit on 06/24/22. Today's reported weight:  No weight reported.  Nutrition Plan: the Category 2 plan.  Current exercise:  walking and hiking 30-45 minutes 2-3 times per week  Interim History:  Jamie Nelson was prescribed last office visit but she did not pick it up because it would be $1000 because of her deductible.  She mostly struggles with overeating/snacking in the evening.  She is able to do well at breakfast and lunch.  Her mom is living with her and tends to cook unhealthy meals for dinner. Breakfast she generally has a protein bar or scrambled eggs with cottage cheese. She would like to exercise more but she has a rectal cutaneous fistula which causes chafing.  She used to be a runner but cannot do this currently because of the chafing.  Assessment/Plan:  We discussed recent lab results in depth. 1. Hyperlipidemia LDL is not at goal.  LDL elevated at 141.  Triglycerides and HDL are normal.  10-year ASCVD risk score is 0.6%. Medication(s): none  Lab Results  Component Value Date   CHOL 223 (H) 06/24/2022   HDL 64 06/24/2022   LDLCALC 141 (H) 06/24/2022   LDLDIRECT 132.6 06/26/2011   TRIG 103 06/24/2022   CHOLHDL 3.5 06/24/2022   CHOLHDL 4 11/21/2020   CHOLHDL 3 05/03/2018   Lab Results  Component Value Date   ALT 31 06/24/2022   AST 19  06/24/2022   ALKPHOS 111 06/24/2022   BILITOT 0.4 06/24/2022   The 10-year ASCVD risk score (Arnett DK, et al., 2019) is: 0.6%   Values used to calculate the score:     Age: 42 years     Sex: Female     Is Non-Hispanic African American: No     Diabetic: No     Tobacco smoker: No     Systolic Blood Pressure: 126 mmHg     Is BP treated: No     HDL Cholesterol: 64 mg/dL     Total Cholesterol: 223 mg/dL  Plan: Continue to work on weight loss and increase exercise.  2. Gastroesophageal reflux disease without esophagitis Symptoms are not well-controlled.  Has globus sensation.  Had been on Prilosec per PCP which helped but she ran out.  Plan: Refill omeprazole 20 mg daily.  3.  Other depression/emotional eating Struggles in the evening with snacking and overeating.   Currently this is poorly controlled. Overall mood is stable.  Medication(s): Bupropion 100 mg prescribed twice daily.  Sometimes only takes morning dose because she forgets and if she takes it too late it disturbs her sleep.  She says it helps with focus.  Plan: Continue bupropion 100 mg twice daily. New prescription: Topiramate 50 mg before supper. Discussed possible side effects.  4.Vitamin D Deficiency Vitamin D is not at goal of 50.  Most recent vitamin D level was 30.7 on 06/24/2022, has increased from 22.9 on 09/10/2021.Marland Kitchen She  is on  prescription ergocalciferol 50,000 IU weekly. Lab Results  Component Value Date   VD25OH 30.7 06/24/2022   VD25OH 22.9 (L) 09/10/2021   VD25OH 24.60 (L) 11/21/2020    Plan: Refill prescription vitamin D 50,000 IU weekly.   5. Generalized Obesity: Starting BMI 35.7  Current BMI 34  Jamie Nelson is currently in the action stage of change. As such, her goal is to continue with weight loss efforts.  She has agreed to the Category 2 plan.  Exercise goals: Increase exercise if possible.  Behavioral modification strategies: increasing lean protein intake, decreasing simple  carbohydrates , meal planning , and planning for success.  Jamie Nelson has agreed to follow-up with our clinic in 4 weeks.   No orders of the defined types were placed in this encounter.   Medications Discontinued During This Encounter  Medication Reason   omeprazole (PRILOSEC) 20 MG capsule    Vitamin D, Ergocalciferol, (DRISDOL) 1.25 MG (50000 UNIT) CAPS capsule Reorder     Meds ordered this encounter  Medications   topiramate (TOPAMAX) 50 MG tablet    Sig: Take 1 tablet (50 mg total) by mouth daily before supper.    Dispense:  30 tablet    Refill:  0    Order Specific Question:   Supervising Provider    Answer:   Glennis Brink [[id number]]   omeprazole (PRILOSEC) 20 MG capsule    Sig: Take 1 capsule (20 mg total) by mouth daily.    Dispense:  90 capsule    Refill:  0    Order Specific Question:   Supervising Provider    Answer:   Seymour Bars E [[id number]]   Vitamin D, Ergocalciferol, (DRISDOL) 1.25 MG (50000 UNIT) CAPS capsule    Sig: Take 1 capsule (50,000 Units total) by mouth every 7 (seven) days.    Dispense:  4 capsule    Refill:  0    Order Specific Question:   Supervising Provider    Answer:   Glennis Brink [[id number]]      Objective:   VITALS: Per patient if applicable, see vitals. GENERAL: Alert and in no acute distress. CARDIOPULMONARY: No increased WOB. Speaking in clear sentences.  PSYCH: Pleasant and cooperative. Speech normal rate and rhythm. Affect is appropriate. Insight and judgement are appropriate. Attention is focused, linear, and appropriate.  NEURO: Oriented as arrived to appointment on time with no prompting.   Attestation Statements:   Reviewed by clinician on day of visit: allergies, medications, problem list, medical history, surgical history, family history, social history, and previous encounter notes.  This was prepared with the assistance of Engineer, civil (consulting).  Occasional wrong-word or sound-a-like substitutions may have occurred due to the inherent  limitations of voice recognition software.

## 2022-07-21 ENCOUNTER — Encounter (INDEPENDENT_AMBULATORY_CARE_PROVIDER_SITE_OTHER): Payer: Self-pay | Admitting: Family Medicine

## 2022-07-21 ENCOUNTER — Telehealth (INDEPENDENT_AMBULATORY_CARE_PROVIDER_SITE_OTHER): Payer: 59 | Admitting: Family Medicine

## 2022-07-21 ENCOUNTER — Ambulatory Visit (INDEPENDENT_AMBULATORY_CARE_PROVIDER_SITE_OTHER): Payer: 59 | Admitting: Family Medicine

## 2022-07-21 ENCOUNTER — Other Ambulatory Visit (HOSPITAL_COMMUNITY): Payer: Self-pay

## 2022-07-21 DIAGNOSIS — E785 Hyperlipidemia, unspecified: Secondary | ICD-10-CM

## 2022-07-21 DIAGNOSIS — Z6834 Body mass index (BMI) 34.0-34.9, adult: Secondary | ICD-10-CM

## 2022-07-21 DIAGNOSIS — E669 Obesity, unspecified: Secondary | ICD-10-CM

## 2022-07-21 DIAGNOSIS — F3289 Other specified depressive episodes: Secondary | ICD-10-CM | POA: Diagnosis not present

## 2022-07-21 DIAGNOSIS — K219 Gastro-esophageal reflux disease without esophagitis: Secondary | ICD-10-CM | POA: Diagnosis not present

## 2022-07-21 DIAGNOSIS — E559 Vitamin D deficiency, unspecified: Secondary | ICD-10-CM | POA: Diagnosis not present

## 2022-07-21 DIAGNOSIS — E7849 Other hyperlipidemia: Secondary | ICD-10-CM

## 2022-07-21 MED ORDER — VITAMIN D (ERGOCALCIFEROL) 1.25 MG (50000 UNIT) PO CAPS
50000.0000 [IU] | ORAL_CAPSULE | ORAL | 0 refills | Status: DC
  Filled 2022-07-21: qty 4, 28d supply, fill #0

## 2022-07-21 MED ORDER — TOPIRAMATE 50 MG PO TABS
50.0000 mg | ORAL_TABLET | Freq: Every day | ORAL | 0 refills | Status: DC
  Filled 2022-07-21: qty 30, 30d supply, fill #0

## 2022-07-21 MED ORDER — OMEPRAZOLE 20 MG PO CPDR
20.0000 mg | DELAYED_RELEASE_CAPSULE | Freq: Every day | ORAL | 0 refills | Status: DC
  Filled 2022-07-21: qty 90, 90d supply, fill #0

## 2022-07-22 ENCOUNTER — Other Ambulatory Visit (HOSPITAL_COMMUNITY): Payer: Self-pay

## 2022-07-22 DIAGNOSIS — Z01411 Encounter for gynecological examination (general) (routine) with abnormal findings: Secondary | ICD-10-CM | POA: Diagnosis not present

## 2022-07-22 DIAGNOSIS — Z124 Encounter for screening for malignant neoplasm of cervix: Secondary | ICD-10-CM | POA: Diagnosis not present

## 2022-07-22 DIAGNOSIS — Z01419 Encounter for gynecological examination (general) (routine) without abnormal findings: Secondary | ICD-10-CM | POA: Diagnosis not present

## 2022-07-22 DIAGNOSIS — Z6834 Body mass index (BMI) 34.0-34.9, adult: Secondary | ICD-10-CM | POA: Diagnosis not present

## 2022-07-22 DIAGNOSIS — Z113 Encounter for screening for infections with a predominantly sexual mode of transmission: Secondary | ICD-10-CM | POA: Diagnosis not present

## 2022-07-22 DIAGNOSIS — E282 Polycystic ovarian syndrome: Secondary | ICD-10-CM | POA: Diagnosis not present

## 2022-07-22 MED ORDER — METFORMIN HCL ER 500 MG PO TB24
500.0000 mg | ORAL_TABLET | Freq: Every day | ORAL | 3 refills | Status: DC
  Filled 2022-07-22 – 2022-08-13 (×2): qty 90, 90d supply, fill #0
  Filled 2022-11-20 – 2022-11-21 (×2): qty 90, 90d supply, fill #1
  Filled 2023-01-20: qty 90, 90d supply, fill #2
  Filled 2023-04-20: qty 90, 90d supply, fill #3

## 2022-07-22 MED ORDER — SPIRONOLACTONE 50 MG PO TABS
50.0000 mg | ORAL_TABLET | Freq: Every day | ORAL | 3 refills | Status: DC
  Filled 2022-07-22 – 2022-09-26 (×2): qty 90, 90d supply, fill #0

## 2022-07-24 ENCOUNTER — Other Ambulatory Visit (HOSPITAL_COMMUNITY): Payer: Self-pay

## 2022-07-24 MED ORDER — SPIRONOLACTONE 50 MG PO TABS
50.0000 mg | ORAL_TABLET | Freq: Two times a day (BID) | ORAL | 3 refills | Status: DC
  Filled 2022-07-24 – 2022-08-13 (×2): qty 180, 90d supply, fill #0
  Filled 2023-01-20: qty 180, 90d supply, fill #1

## 2022-07-29 DIAGNOSIS — F411 Generalized anxiety disorder: Secondary | ICD-10-CM | POA: Diagnosis not present

## 2022-08-06 ENCOUNTER — Other Ambulatory Visit (HOSPITAL_COMMUNITY): Payer: Self-pay

## 2022-08-10 ENCOUNTER — Other Ambulatory Visit (HOSPITAL_COMMUNITY): Payer: Self-pay

## 2022-08-13 ENCOUNTER — Other Ambulatory Visit: Payer: Self-pay

## 2022-08-13 ENCOUNTER — Other Ambulatory Visit (INDEPENDENT_AMBULATORY_CARE_PROVIDER_SITE_OTHER): Payer: Self-pay | Admitting: Family Medicine

## 2022-08-13 ENCOUNTER — Other Ambulatory Visit (HOSPITAL_COMMUNITY): Payer: Self-pay

## 2022-08-13 DIAGNOSIS — E559 Vitamin D deficiency, unspecified: Secondary | ICD-10-CM

## 2022-08-13 DIAGNOSIS — K219 Gastro-esophageal reflux disease without esophagitis: Secondary | ICD-10-CM

## 2022-08-13 DIAGNOSIS — F3289 Other specified depressive episodes: Secondary | ICD-10-CM

## 2022-08-14 ENCOUNTER — Other Ambulatory Visit: Payer: Self-pay

## 2022-08-17 ENCOUNTER — Other Ambulatory Visit (HOSPITAL_COMMUNITY): Payer: Self-pay

## 2022-08-25 ENCOUNTER — Encounter (INDEPENDENT_AMBULATORY_CARE_PROVIDER_SITE_OTHER): Payer: Self-pay | Admitting: Family Medicine

## 2022-08-25 ENCOUNTER — Other Ambulatory Visit: Payer: Self-pay

## 2022-08-25 ENCOUNTER — Other Ambulatory Visit (HOSPITAL_COMMUNITY): Payer: Self-pay

## 2022-08-25 DIAGNOSIS — F3289 Other specified depressive episodes: Secondary | ICD-10-CM

## 2022-08-25 MED ORDER — BUPROPION HCL ER (SR) 100 MG PO TB12
100.0000 mg | ORAL_TABLET | Freq: Two times a day (BID) | ORAL | 0 refills | Status: DC
  Filled 2022-08-25: qty 60, 30d supply, fill #0

## 2022-09-03 ENCOUNTER — Ambulatory Visit (INDEPENDENT_AMBULATORY_CARE_PROVIDER_SITE_OTHER): Payer: 59 | Admitting: Family Medicine

## 2022-09-04 ENCOUNTER — Other Ambulatory Visit (HOSPITAL_COMMUNITY): Payer: Self-pay

## 2022-09-04 MED ORDER — METRONIDAZOLE 500 MG PO TABS
500.0000 mg | ORAL_TABLET | Freq: Two times a day (BID) | ORAL | 0 refills | Status: DC
  Filled 2022-09-04: qty 14, 7d supply, fill #0

## 2022-09-08 NOTE — Progress Notes (Signed)
TeleHealth Visit:  This visit was completed with telemedicine (audio/video) technology. Jamie Nelson has verbally consented to this TeleHealth visit. The patient is located at home, the provider is located at home. The participants in this visit include the listed provider and patient. The visit was conducted today via MyChart video.  OBESITY Jamie Nelson is here to discuss her progress with her obesity treatment plan along with follow-up of her obesity related diagnoses.   Today's visit was # 10 Starting weight: 215 lbs Starting date: 09/10/2021 Weight at last in office visit: 207 lbs on 06/24/22 Total weight loss: 8 lbs at last in office visit on 06/24/22. Weight reported last virtual visit on 07/21/2022: None reported Today's reported weight (09/09/22):  204.5  lbs  Nutrition Plan: the Category 2 plan - 75% adherence.  Current exercise:  walking/jogging 30-45 minutes 2-3 times per week  Interim History:  She does well on plan during the week but not as well on the weekends. She has cut out alcohol during the week and feels she has made better food/drink choices overall. She feels better with less carbs. Getting some of her protein through non-meat sources- cottage cheese, yogurt. Lunch- salad with grilled chicken or homemade egg or chicken salad (made with blended cottage cheese).  She was on Saxenda in the past and had some nausea.  She has 3 pens at home and would like to resume taking it. She reports some issues with "food noise".  She would like to alternate virtual and in office visits.  Assessment/Plan:  1. Other depression/emotional eating [name] has had issues with stress/emotional eating. Currently this is moderately controlled.  Topiramate started last office visit.  She did not feel well while taking it and discontinued it. Medication(s): Bupropion SR 100 mg twice daily  Plan: Continue Bupropion SR 100 mg twice daily  2. PCOS Last fasting insulin was 7.5. A1c was  5.1. On metformin and Aldactone. Polyphagia:Yes Lab Results  Component Value Date   HGBA1C 5.1 09/10/2021   HGBA1C 5.1 05/03/2018   HGBA1C 5.0 12/22/2016   HGBA1C 5.0 03/30/2016   Lab Results  Component Value Date   INSULIN 7.5 09/10/2021    Plan: Continue metformin XR 500 mg daily and Aldactone 50 mg daily.   3. Generalized Obesity: Current BMI 35 Pharmacotherapy Plan Start  Saxenda 0.6 mg SQ daily May increase to 1.2 mg daily if no nausea. Avett is currently in the action stage of change. As such, her goal is to continue with weight loss efforts.  She has agreed to the Category 2 plan.  Exercise goals:  as   Behavioral modification strategies: increasing lean protein intake, decreasing simple carbohydrates , meal planning , and planning for success.  [name] has agreed to follow-up with our clinic in 4 weeks.   No orders of the defined types were placed in this encounter.   Medications Discontinued During This Encounter  Medication Reason   topiramate (TOPAMAX) 50 MG tablet Side effect (s)   Semaglutide-Weight Management (WEGOVY) 0.25 MG/0.5ML SOAJ Cost of medication   cephALEXin (KEFLEX) 500 MG capsule Completed Course     No orders of the defined types were placed in this encounter.     Objective:   VITALS: Per patient if applicable, see vitals. GENERAL: Alert and in no acute distress. CARDIOPULMONARY: No increased WOB. Speaking in clear sentences.  PSYCH: Pleasant and cooperative. Speech normal rate and rhythm. Affect is appropriate. Insight and judgement are appropriate. Attention is focused, linear, and appropriate.  NEURO: Oriented as  arrived to appointment on time with no prompting.   Attestation Statements:   Reviewed by clinician on day of visit: allergies, medications, problem list, medical history, surgical history, family history, social history, and previous encounter notes.  Time spent on visit including the items listed below was 30 minutes.   -preparing to see the patient (e.g., review of tests, history, previous notes) -obtaining and/or reviewing separately obtained history -counseling and educating the patient/family/caregiver -documenting clinical information in the electronic or other health record -ordering medications, tests, or procedures -independently interpreting results and communicating results to the patient/ family/caregiver -referring and communicating with other health care professionals  -care coordination   This was prepared with the assistance of Engineer, civil (consulting).  Occasional wrong-word or sound-a-like substitutions may have occurred due to the inherent limitations of voice recognition software.

## 2022-09-09 ENCOUNTER — Encounter (INDEPENDENT_AMBULATORY_CARE_PROVIDER_SITE_OTHER): Payer: Self-pay | Admitting: Family Medicine

## 2022-09-09 ENCOUNTER — Telehealth (INDEPENDENT_AMBULATORY_CARE_PROVIDER_SITE_OTHER): Payer: 59 | Admitting: Family Medicine

## 2022-09-09 VITALS — Ht 64.0 in | Wt 204.0 lb

## 2022-09-09 DIAGNOSIS — E669 Obesity, unspecified: Secondary | ICD-10-CM | POA: Diagnosis not present

## 2022-09-09 DIAGNOSIS — Z6835 Body mass index (BMI) 35.0-35.9, adult: Secondary | ICD-10-CM

## 2022-09-09 DIAGNOSIS — E282 Polycystic ovarian syndrome: Secondary | ICD-10-CM | POA: Diagnosis not present

## 2022-09-09 DIAGNOSIS — F3289 Other specified depressive episodes: Secondary | ICD-10-CM | POA: Diagnosis not present

## 2022-09-11 DIAGNOSIS — M19072 Primary osteoarthritis, left ankle and foot: Secondary | ICD-10-CM | POA: Diagnosis not present

## 2022-09-11 DIAGNOSIS — M67472 Ganglion, left ankle and foot: Secondary | ICD-10-CM | POA: Diagnosis not present

## 2022-09-15 DIAGNOSIS — F411 Generalized anxiety disorder: Secondary | ICD-10-CM | POA: Diagnosis not present

## 2022-09-21 ENCOUNTER — Encounter: Payer: Self-pay | Admitting: Genetic Counselor

## 2022-09-21 NOTE — Progress Notes (Signed)
UPDATE: NF1 [id number] (p.[id number]) VUS has been reclassified to likely benign.  The amended report date is September 07, 2022.

## 2022-09-26 ENCOUNTER — Other Ambulatory Visit (HOSPITAL_COMMUNITY): Payer: Self-pay

## 2022-09-26 ENCOUNTER — Other Ambulatory Visit: Payer: Self-pay | Admitting: Family Medicine

## 2022-09-26 ENCOUNTER — Telehealth: Payer: 59 | Admitting: Physician Assistant

## 2022-09-26 ENCOUNTER — Other Ambulatory Visit (INDEPENDENT_AMBULATORY_CARE_PROVIDER_SITE_OTHER): Payer: Self-pay | Admitting: Family Medicine

## 2022-09-26 DIAGNOSIS — R399 Unspecified symptoms and signs involving the genitourinary system: Secondary | ICD-10-CM | POA: Diagnosis not present

## 2022-09-26 DIAGNOSIS — F3289 Other specified depressive episodes: Secondary | ICD-10-CM

## 2022-09-26 DIAGNOSIS — E559 Vitamin D deficiency, unspecified: Secondary | ICD-10-CM

## 2022-09-26 MED ORDER — NITROFURANTOIN MONOHYD MACRO 100 MG PO CAPS: 100.0000 mg | ORAL_CAPSULE | Freq: Two times a day (BID) | ORAL | 0 refills | Status: AC

## 2022-09-26 NOTE — Progress Notes (Signed)
E-Visit for Urinary Problems  We are sorry that you are not feeling well.  Here is how we plan to help!  Based on what you shared with me it looks like you most likely have a simple urinary tract infection.  A UTI (Urinary Tract Infection) is a bacterial infection of the bladder.  Most cases of urinary tract infections are simple to treat but a key part of your care is to encourage you to drink plenty of fluids and watch your symptoms carefully.  I have prescribed MacroBid 100 mg twice a day for 5 days.  Your symptoms should gradually improve. Call us if the burning in your urine worsens, you develop worsening fever, back pain or pelvic pain or if your symptoms do not resolve after completing the antibiotic.  Urinary tract infections can be prevented by drinking plenty of water to keep your body hydrated.  Also be sure when you wipe, wipe from front to back and don't hold it in!  If possible, empty your bladder every 4 hours.  HOME CARE Drink plenty of fluids Compete the full course of the antibiotics even if the symptoms resolve Remember, when you need to go.go. Holding in your urine can increase the likelihood of getting a UTI! GET HELP RIGHT AWAY IF: You cannot urinate You get a high fever Worsening back pain occurs You see blood in your urine You feel sick to your stomach or throw up You feel like you are going to pass out  MAKE SURE YOU  Understand these instructions. Will watch your condition. Will get help right away if you are not doing well or get worse.   Thank you for choosing an e-visit.  Your e-visit answers were reviewed by a board certified advanced clinical practitioner to complete your personal care plan. Depending upon the condition, your plan could have included both over the counter or prescription medications.  Please review your pharmacy choice. Make sure the pharmacy is open so you can pick up prescription now. If there is a problem, you may contact your  provider through MyChart messaging and have the prescription routed to another pharmacy.  Your safety is important to us. If you have drug allergies check your prescription carefully.   For the next 24 hours you can use MyChart to ask questions about today's visit, request a non-urgent call back, or ask for a work or school excuse. You will get an email in the next two days asking about your experience. I hope that your e-visit has been valuable and will speed your recovery.   I have spent 5 minutes in review of e-visit questionnaire, review and updating patient chart, medical decision making and response to patient.   Deneane Stifter Z Ward, PA-C    

## 2022-09-28 ENCOUNTER — Other Ambulatory Visit (HOSPITAL_COMMUNITY): Payer: Self-pay

## 2022-09-28 MED ORDER — BUPROPION HCL ER (SR) 100 MG PO TB12
100.0000 mg | ORAL_TABLET | Freq: Two times a day (BID) | ORAL | 0 refills | Status: DC
  Filled 2022-09-28: qty 60, 30d supply, fill #0

## 2022-09-28 MED ORDER — VITAMIN D (ERGOCALCIFEROL) 1.25 MG (50000 UNIT) PO CAPS
50000.0000 [IU] | ORAL_CAPSULE | ORAL | 0 refills | Status: DC
  Filled 2022-09-28: qty 4, 28d supply, fill #0

## 2022-10-07 ENCOUNTER — Encounter (INDEPENDENT_AMBULATORY_CARE_PROVIDER_SITE_OTHER): Payer: Self-pay

## 2022-10-08 ENCOUNTER — Ambulatory Visit (INDEPENDENT_AMBULATORY_CARE_PROVIDER_SITE_OTHER): Payer: 59 | Admitting: Family Medicine

## 2022-10-09 ENCOUNTER — Other Ambulatory Visit (HOSPITAL_COMMUNITY): Payer: Self-pay

## 2022-10-13 ENCOUNTER — Encounter (INDEPENDENT_AMBULATORY_CARE_PROVIDER_SITE_OTHER): Payer: Self-pay | Admitting: Family Medicine

## 2022-10-13 ENCOUNTER — Ambulatory Visit (INDEPENDENT_AMBULATORY_CARE_PROVIDER_SITE_OTHER): Payer: 59 | Admitting: Family Medicine

## 2022-10-13 ENCOUNTER — Other Ambulatory Visit (HOSPITAL_COMMUNITY): Payer: Self-pay

## 2022-10-13 VITALS — BP 125/79 | HR 86 | Temp 98.7°F | Ht 64.0 in | Wt 207.0 lb

## 2022-10-13 DIAGNOSIS — F3289 Other specified depressive episodes: Secondary | ICD-10-CM | POA: Diagnosis not present

## 2022-10-13 DIAGNOSIS — Z6835 Body mass index (BMI) 35.0-35.9, adult: Secondary | ICD-10-CM | POA: Diagnosis not present

## 2022-10-13 DIAGNOSIS — E669 Obesity, unspecified: Secondary | ICD-10-CM

## 2022-10-13 DIAGNOSIS — E282 Polycystic ovarian syndrome: Secondary | ICD-10-CM

## 2022-10-13 DIAGNOSIS — E7849 Other hyperlipidemia: Secondary | ICD-10-CM

## 2022-10-13 DIAGNOSIS — E559 Vitamin D deficiency, unspecified: Secondary | ICD-10-CM | POA: Diagnosis not present

## 2022-10-13 DIAGNOSIS — E66812 Obesity, class 2: Secondary | ICD-10-CM

## 2022-10-13 DIAGNOSIS — E782 Mixed hyperlipidemia: Secondary | ICD-10-CM

## 2022-10-13 MED ORDER — BUPROPION HCL ER (SR) 100 MG PO TB12
100.0000 mg | ORAL_TABLET | Freq: Two times a day (BID) | ORAL | 0 refills | Status: DC
  Filled 2022-10-13: qty 60, 30d supply, fill #0

## 2022-10-13 NOTE — Assessment & Plan Note (Signed)
Improving on Wellbutrin SR 100 mg bid Denies adverse SE Emotional cravings have reduced on this medication  Continue current meds Work on stress reduction Consider adding in CBT

## 2022-10-13 NOTE — Assessment & Plan Note (Signed)
Doing well on metformin XR 500 mg once daily without adverse SE Her last fasting insulin level was normal She has reduced her carb/ sugar cravings and added in cardio 3+ x a week  Continue current plan of care

## 2022-10-13 NOTE — Assessment & Plan Note (Signed)
Lab Results  Component Value Date   CHOL 223 (H) 06/24/2022   HDL 64 06/24/2022   LDLCALC 141 (H) 06/24/2022   LDLDIRECT 132.6 06/26/2011   TRIG 103 06/24/2022   CHOLHDL 3.5 06/24/2022   She is not on any lipid lowering meds She is adherent to a low saturated fat diet  Plan to recheck FLP in the next 7 mos

## 2022-10-13 NOTE — Progress Notes (Signed)
Office: [phone]  /  Fax: [phone]  WEIGHT SUMMARY AND BIOMETRICS  Starting Date: 09/10/21  Starting Weight: 215 lb   Weight Lost Since Last Visit: 0   Vitals Temp: 98.7 F (37.1 C) BP: 125/79 Pulse Rate: 86 SpO2: 99 %   Body Composition  Body Fat %: 40.8 % Fat Mass (lbs): 84.6 lbs Muscle Mass (lbs): 116.4 lbs Total Body Water (lbs): 79.4 lbs Visceral Fat Rating : 10     HPI  Chief Complaint: OBESITY  Jamie Nelson is here to discuss her progress with her obesity treatment plan. She is on the the Category 2 Plan and states she is following her eating plan approximately 70 % of the time. She states she is jogging 2 miles  2 to 3 times per week.   Interval History:  Since last office visit she is is up 3 lb She is back on Saxenda - using up some old pens that she had left, able to only tolerate 0.6 mg daily injection due to nausea on higher doses She is running 2 miles 3x a week She plans to get back in the pool this summer Her net weight loss is 8 lb in the past 13 mos She admits to getting off track in social situations She is motivated to continue to work on weight reduction She has never used phentermine or Qsymia She had brain fog from Topamax  Pharmacotherapy: Saxenda 0.6 mg daily  PHYSICAL EXAM:  Blood pressure 125/79, pulse 86, temperature 98.7 F (37.1 C), height 5\' 4"  (1.626 m), weight 207 lb (93.9 kg), SpO2 99 %. Body mass index is 35.53 kg/m.  General: She is overweight, cooperative, alert, well developed, and in no acute distress. PSYCH: Has normal mood, affect and thought process.   Lungs: Normal breathing effort, no conversational dyspnea.   ASSESSMENT AND PLAN  TREATMENT PLAN FOR OBESITY:  Recommended Dietary Goals  Jamie Nelson is currently in the action stage of change. As such, her goal is to continue weight management plan. She has agreed to the Category 2 Plan. Vegetarian meal plan given-- she would like to chose plant based options  on occasion  Behavioral Intervention  We discussed the following Behavioral Modification Strategies today: increasing lean protein intake, decreasing simple carbohydrates , increasing vegetables, increasing lower glycemic fruits, increasing fiber rich foods, increasing water intake, keeping healthy foods at home, continue to practice mindfulness when eating, and planning for success. - reviewed strategies to reduce alcohol calories in social settings  Additional resources provided today: NA  Recommended Physical Activity Goals  Jamie Nelson has been advised to work up to 150 minutes of moderate intensity aerobic activity a week and strengthening exercises 2-3 times per week for cardiovascular health, weight loss maintenance and preservation of muscle mass.   She has agreed to Increase the intensity, frequency or duration of aerobic exercises    Pharmacotherapy changes for the treatment of obesity: none  ASSOCIATED CONDITIONS ADDRESSED TODAY  Vitamin D deficiency Assessment & Plan: Last vitamin D Lab Results  Component Value Date   VD25OH 30.7 06/24/2022   A vitamin D lab has been ordered to be drawn this week If <50, will resume RX vitamin D 50,000 IU weekly Energy level is stable    Orders: -     VITAMIN D 25 Hydroxy (Vit-D Deficiency, Fractures)  Other depression with emotional eating Assessment & Plan: Improving on Wellbutrin SR 100 mg bid Denies adverse SE Emotional cravings have reduced on this medication  Continue current meds Work  on stress reduction Consider adding in CBT  Orders: -     buPROPion HCl ER (SR); Take 1 tablet (100 mg total) by mouth 2 (two) times daily.  Dispense: 60 tablet; Refill: 0  Obesity, Class II, BMI 35-39.9  Hyperlipidemia with hypertriglyceridemia- not requiring statin therapy  PCOS (polycystic ovarian syndrome) Assessment & Plan: Doing well on metformin XR 500 mg once daily without adverse SE Her last fasting insulin level was  normal She has reduced her carb/ sugar cravings and added in cardio 3+ x a week  Continue current plan of care   Other hyperlipidemia Assessment & Plan: Lab Results  Component Value Date   CHOL 223 (H) 06/24/2022   HDL 64 06/24/2022   LDLCALC 141 (H) 06/24/2022   LDLDIRECT 132.6 06/26/2011   TRIG 103 06/24/2022   CHOLHDL 3.5 06/24/2022   She is not on any lipid lowering meds She is adherent to a low saturated fat diet  Plan to recheck FLP in the next 7 mos       She was informed of the importance of frequent follow up visits to maximize her success with intensive lifestyle modifications for her multiple health conditions.   ATTESTASTION STATEMENTS:  Reviewed by clinician on day of visit: allergies, medications, problem list, medical history, surgical history, family history, social history, and previous encounter notes pertinent to obesity diagnosis.   I have personally spent 30 minutes total time today in preparation, patient care, nutritional counseling and documentation for this visit, including the following: review of clinical lab tests; review of medical tests/procedures/services.      Jamie Brink, DO DABFM, DABOM [facility] [address] [place], [place] [address] [phone]

## 2022-10-13 NOTE — Assessment & Plan Note (Signed)
Last vitamin D Lab Results  Component Value Date   VD25OH 30.7 06/24/2022   A vitamin D lab has been ordered to be drawn this week If <50, will resume RX vitamin D 50,000 IU weekly Energy level is stable

## 2022-10-14 ENCOUNTER — Other Ambulatory Visit (HOSPITAL_COMMUNITY): Payer: Self-pay

## 2022-10-14 DIAGNOSIS — E559 Vitamin D deficiency, unspecified: Secondary | ICD-10-CM | POA: Diagnosis not present

## 2022-10-14 DIAGNOSIS — F411 Generalized anxiety disorder: Secondary | ICD-10-CM | POA: Diagnosis not present

## 2022-10-15 ENCOUNTER — Other Ambulatory Visit (HOSPITAL_COMMUNITY): Payer: Self-pay

## 2022-10-15 ENCOUNTER — Other Ambulatory Visit (INDEPENDENT_AMBULATORY_CARE_PROVIDER_SITE_OTHER): Payer: Self-pay | Admitting: Family Medicine

## 2022-10-15 DIAGNOSIS — E559 Vitamin D deficiency, unspecified: Secondary | ICD-10-CM

## 2022-10-15 LAB — VITAMIN D 25 HYDROXY (VIT D DEFICIENCY, FRACTURES): Vit D, 25-Hydroxy: 32 ng/mL (ref 30.0–100.0)

## 2022-10-15 MED ORDER — VITAMIN D (ERGOCALCIFEROL) 1.25 MG (50000 UNIT) PO CAPS
50000.0000 [IU] | ORAL_CAPSULE | ORAL | 0 refills | Status: DC
  Filled 2022-10-15: qty 12, 84d supply, fill #0

## 2022-10-23 ENCOUNTER — Other Ambulatory Visit (HOSPITAL_COMMUNITY): Payer: Self-pay

## 2022-10-23 ENCOUNTER — Telehealth: Payer: 59 | Admitting: Family Medicine

## 2022-10-23 DIAGNOSIS — R3989 Other symptoms and signs involving the genitourinary system: Secondary | ICD-10-CM | POA: Diagnosis not present

## 2022-10-23 MED ORDER — CEPHALEXIN 500 MG PO CAPS
500.0000 mg | ORAL_CAPSULE | Freq: Two times a day (BID) | ORAL | 0 refills | Status: AC
  Filled 2022-10-23: qty 14, 7d supply, fill #0

## 2022-10-23 NOTE — Progress Notes (Signed)

## 2022-11-04 NOTE — Progress Notes (Signed)
11/05/22          TeleHealth Visit:  This visit was completed with telemedicine (audio/video) technology. Jamie Nelson has verbally consented to this TeleHealth visit. The patient is located at home, the provider is located at home. The participants in this visit include the listed provider and patient. The visit was conducted today via MyChart video.  OBESITY Jamie Nelson is here to discuss her progress with her obesity treatment plan along with follow-up of her obesity related diagnoses.   Today's visit was # 12 Starting weight: 215 lbs Starting date: 09/10/2021 Weight at last in office visit: 207 lbs on 10/13/22 Total weight loss: 8 lbs at last in office visit on 10/13/22. Today's reported weight (11/05/22):  202 lbs  Nutrition Plan: the Category 2 plan and the vegetarian plan  Current exercise:  jogging and hiking 2 to 3 times per week.   Interim History:  [place] some what [place] plan. She is doing a blend of vegetarian plan and cat 2 plan. She cooks for her family and tries to keep her meals healthy. 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 She doesn't keep up with journaling after a few weeks. However she likes to eat foods like Daves killer bread and avocado so journaling is probably the best plan for her.  She stopped taking leftover Saxenda last week due to GI side effects (taking 0.6 mg dose)..  Assessment/Plan:  1. Vitamin D Deficiency Vitamin D is not at goal of 50.  Most recent vitamin D level was 32 on 10/14/2022.Marland Kitchen She is on  prescription ergocalciferol 50,000 IU weekly. Lab Results  Component Value Date   VD25OH 32.0 10/14/2022   VD25OH 30.7 06/24/2022   VD25OH 22.9 (L) 09/10/2021    Plan: Continue  prescription ergocalciferol 50,000 IU weekly   2. Nonalcoholic fatty liver disease  Shehas been diagnosed with nonalcoholic fatty liver disease. Most recent liver enzymes are  normal. She continues to work on weight loss to improve this condition. Denies abdominal pain or jaundice.  Lab Results  Component Value Date   ALT 31 06/24/2022   AST 19 06/24/2022   ALKPHOS 111 06/24/2022   BILITOT 0.4 06/24/2022    Plan: Continue to monitor liver enzymes. Continue to work on weight loss with meal plan and exercise.  3. Generalized Obesity: Current BMI 35  Jamie Nelson is currently in the action stage of change. As such, her goal is to continue with weight loss efforts.  She has agreed to practicing portion control and making smarter food choices, such as increasing vegetables and decreasing simple carbohydrates. Daily calorie goal is 1200-1300 calories and protein goal is 80-90 grams. Here is the breakdown of meals: Breakfast: 200-300 calories, 20+ grams protein Lunch: 300-400 calories, 30+ grams protein Dinner: 400-500 calories, 35+ grams protein  1.  Provided parameters for each meal as far as calories and protein to help her stay on track with calories and protein without actually journaling the complete day. 2.  Discussed meatless options for meals.  Exercise goals:  as is  Behavioral modification strategies: increasing lean protein intake, decreasing simple carbohydrates , and meal planning .  Jamie Nelson has agreed to follow-up with our clinic in 4 weeks.  No orders of the defined types were placed in this encounter.   There are no discontinued medications.   No orders of the defined types were placed in this encounter.  Objective:   VITALS: Per patient if applicable, see vitals. GENERAL: Alert and in no acute distress. CARDIOPULMONARY: No increased WOB. Speaking in clear sentences.  PSYCH: Pleasant and cooperative. Speech normal rate and rhythm. Affect is appropriate. Insight and judgement are appropriate. Attention is focused, linear, and appropriate.  NEURO: Oriented as arrived to appointment on time with no prompting.   Attestation Statements:    Reviewed by clinician on day of visit: allergies, medications, problem list, medical history, surgical history, family history, social history, and previous encounter notes.  Time spent on visit including the items listed below was 30 minutes.  -preparing to see the patient (e.g., review of tests, history, previous notes) -obtaining and/or reviewing separately obtained history -counseling and educating the patient/family/caregiver -documenting clinical information in the electronic or other health record -ordering medications, tests, or procedures -independently interpreting results and communicating results to the patient/ family/caregiver -referring and communicating with other health care professionals  -care coordination   This was prepared with the assistance of Engineer, civil (consulting).  Occasional wrong-word or sound-a-like substitutions may have occurred due to the inherent limitations of voice recognition software.

## 2022-11-05 ENCOUNTER — Encounter (INDEPENDENT_AMBULATORY_CARE_PROVIDER_SITE_OTHER): Payer: Self-pay | Admitting: Family Medicine

## 2022-11-05 ENCOUNTER — Telehealth (INDEPENDENT_AMBULATORY_CARE_PROVIDER_SITE_OTHER): Payer: 59 | Admitting: Family Medicine

## 2022-11-05 DIAGNOSIS — K760 Fatty (change of) liver, not elsewhere classified: Secondary | ICD-10-CM

## 2022-11-05 DIAGNOSIS — E559 Vitamin D deficiency, unspecified: Secondary | ICD-10-CM

## 2022-11-05 DIAGNOSIS — Z6835 Body mass index (BMI) 35.0-35.9, adult: Secondary | ICD-10-CM

## 2022-11-05 DIAGNOSIS — E669 Obesity, unspecified: Secondary | ICD-10-CM | POA: Diagnosis not present

## 2022-11-17 ENCOUNTER — Other Ambulatory Visit (HOSPITAL_COMMUNITY): Payer: Self-pay

## 2022-11-17 ENCOUNTER — Telehealth: Payer: 59 | Admitting: Physician Assistant

## 2022-11-17 DIAGNOSIS — N390 Urinary tract infection, site not specified: Secondary | ICD-10-CM

## 2022-11-17 MED ORDER — SULFAMETHOXAZOLE-TRIMETHOPRIM 800-160 MG PO TABS
1.0000 | ORAL_TABLET | Freq: Two times a day (BID) | ORAL | 0 refills | Status: AC
  Filled 2022-11-17: qty 10, 5d supply, fill #0

## 2022-11-17 NOTE — Progress Notes (Signed)
I have spent 5 minutes in review of e-visit questionnaire, review and updating patient chart, medical decision making and response to patient.   Janisa Labus Cody Aqsa Sensabaugh, PA-C    

## 2022-11-17 NOTE — Progress Notes (Signed)
E-Visit for Urinary Problems  We are sorry that you are not feeling well.  Here is how we plan to help!  Based on what you shared with me it looks like you most likely have a simple urinary tract infection.  A UTI (Urinary Tract Infection) is a bacterial infection of the bladder.  Most cases of urinary tract infections are simple to treat but a key part of your care is to encourage you to drink plenty of fluids and watch your symptoms carefully.  I have prescribed Bactrim DS One tablet twice a day for 5 days.  Your symptoms should gradually improve. Call us if the burning in your urine worsens, you develop worsening fever, back pain or pelvic pain or if your symptoms do not resolve after completing the antibiotic.  Urinary tract infections can be prevented by drinking plenty of water to keep your body hydrated.  Also be sure when you wipe, wipe from front to back and don't hold it in!  If possible, empty your bladder every 4 hours.  Giving frequency of UTI you need to follow-up with your PCP to discuss an ongoing management plan.    HOME CARE Drink plenty of fluids Compete the full course of the antibiotics even if the symptoms resolve Remember, when you need to go.go. Holding in your urine can increase the likelihood of getting a UTI! GET HELP RIGHT AWAY IF: You cannot urinate You get a high fever Worsening back pain occurs You see blood in your urine You feel sick to your stomach or throw up You feel like you are going to pass out  MAKE SURE YOU  Understand these instructions. Will watch your condition. Will get help right away if you are not doing well or get worse.   Thank you for choosing an e-visit.  Your e-visit answers were reviewed by a board certified advanced clinical practitioner to complete your personal care plan. Depending upon the condition, your plan could have included both over the counter or prescription medications.  Please review your pharmacy choice. Make  sure the pharmacy is open so you can pick up prescription now. If there is a problem, you may contact your provider through [employer] and have the prescription routed to another pharmacy.  Your safety is important to [place]. If you have drug allergies check your prescription carefully.   For the next 24 hours you can use MyChart to ask questions about today's visit, request a non-urgent call back, or ask for a work or school excuse. You will get an email in the next two days asking about your experience. I hope that your e-visit has been valuable and will speed your recovery.

## 2022-11-18 ENCOUNTER — Other Ambulatory Visit (HOSPITAL_COMMUNITY): Payer: Self-pay

## 2022-11-18 MED ORDER — CEPHALEXIN 500 MG PO CAPS
500.0000 mg | ORAL_CAPSULE | Freq: Two times a day (BID) | ORAL | 0 refills | Status: DC
  Filled 2022-11-18: qty 14, 7d supply, fill #0

## 2022-11-18 NOTE — Addendum Note (Signed)
Addended by: Bennie Pierini on: 11/18/2022 12:28 PM   Modules accepted: Orders

## 2022-11-21 ENCOUNTER — Other Ambulatory Visit (HOSPITAL_COMMUNITY): Payer: Self-pay

## 2022-11-23 ENCOUNTER — Other Ambulatory Visit (HOSPITAL_COMMUNITY): Payer: Self-pay

## 2022-12-01 NOTE — Progress Notes (Deleted)
  TeleHealth Visit:  This visit was completed with telemedicine (audio/video) technology. Jahnvi has verbally consented to this TeleHealth visit. The patient is located at home, the provider is located at home. The participants in this visit include the listed provider and patient. The visit was conducted today via MyChart video.  OBESITY Miceala is here to discuss her progress with her obesity treatment plan along with follow-up of her obesity related diagnoses.   Today's visit was # 13 Starting weight: 215 lbs Starting date: 09/10/2021 Weight at last in office visit: 207 lbs on 10/13/22 Total weight loss: 8 lbs at last in office visit on 10/13/22. Today's reported weight (***): {dwwweightreported:29243}  Nutrition Plan: keeping a food journal with goal of 1200-1300 calories and 80-90 grams of protein daily - ***% adherence. Breakfast: 200-300 calories, 20+ grams protein Lunch: 300-400 calories, 30+ grams protein Dinner: 400-500 calories, 35+ grams protein   Current exercise: {exercise types:16438} jogging and hiking 2 to 3 times per week  Interim History:  ***  Eating all of the prescribed protein: {yes***/no:17258} Skipping meals: {dwwyes:29172} Drinking adequate water: {dwwyes:29172} Drinking sugar sweetened beverages: {dwwyes:29172} Hunger controlled: {EWCONTROLASSESSMENT:24261}. Cravings controlled:  {EWCONTROLASSESSMENT:24261}.  Journaling Consistently:  {dwwyes:29172} Meeting protein goals:  {dwwyes:29172} Meeting calorie goals:  {dwwyes:29172}   Pharmacotherapy: Esmirna is on {dwwpharmacotherapy:29109} Adverse side effects: {dwwse:29122} Hunger is {EWCONTROLASSESSMENT:24261}.  Cravings are {EWCONTROLASSESSMENT:24261}.  Assessment/Plan:  1. ***  2. ***  3. ***  {dwwmorbid:29108::"Morbid Obesity"}: Current BMI ***  Pharmacotherapy Plan {dwwmed:29123}  {dwwpharmacotherapy:29109}  Sofia {CHL AMB IS/IS NOT:210130109} currently in the action stage of change. As  such, her goal is to {MWMwtloss#1:210800005}.  She has agreed to {dwwsldiets:29085}.  Exercise goals: {MWM EXERCISE RECS:23473}  Behavioral modification strategies: {dwwslwtlossstrategies:29088}.  Chiante has agreed to follow-up with our clinic in {NUMBER 1-10:22536} {dwwfutime:29619}  No orders of the defined types were placed in this encounter.   There are no discontinued medications.   No orders of the defined types were placed in this encounter.     Objective:   VITALS: Per patient if applicable, see vitals. GENERAL: Alert and in no acute distress. CARDIOPULMONARY: No increased WOB. Speaking in clear sentences.  PSYCH: Pleasant and cooperative. Speech normal rate and rhythm. Affect is appropriate. Insight and judgement are appropriate. Attention is focused, linear, and appropriate.  NEURO: Oriented as arrived to appointment on time with no prompting.   Attestation Statements:   Reviewed by clinician on day of visit: allergies, medications, problem list, medical history, surgical history, family history, social history, and previous encounter notes.  ***(delete if time-based billing not used) Time spent on visit including the items listed below was *** minutes.  -preparing to see the patient (e.g., review of tests, history, previous notes) -obtaining and/or reviewing separately obtained history -counseling and educating the patient/family/caregiver -documenting clinical information in the electronic or other health record -ordering medications, tests, or procedures -independently interpreting results and communicating results to the patient/ family/caregiver -referring and communicating with other health care professionals  -care coordination   This was prepared with the assistance of Engineer, civil (consulting).  Occasional wrong-word or sound-a-like substitutions may have occurred due to the inherent limitations of voice recognition software.

## 2022-12-02 ENCOUNTER — Encounter (INDEPENDENT_AMBULATORY_CARE_PROVIDER_SITE_OTHER): Payer: Self-pay | Admitting: Family Medicine

## 2022-12-02 ENCOUNTER — Telehealth (INDEPENDENT_AMBULATORY_CARE_PROVIDER_SITE_OTHER): Payer: 59 | Admitting: Family Medicine

## 2022-12-02 DIAGNOSIS — E669 Obesity, unspecified: Secondary | ICD-10-CM

## 2022-12-02 DIAGNOSIS — Z6835 Body mass index (BMI) 35.0-35.9, adult: Secondary | ICD-10-CM

## 2022-12-10 NOTE — Progress Notes (Unsigned)
TeleHealth Visit:  This visit was completed with telemedicine (audio/video) technology. Jamie Nelson has verbally consented to this TeleHealth visit. The patient is located at home, the provider is located at home. The participants in this visit include the listed provider and patient. The visit was conducted today via MyChart video.  OBESITY Jamie Nelson is here to discuss her progress with her obesity treatment plan along with follow-up of her obesity related diagnoses.    Today's visit was # 13  Starting weight: 215 lbs Starting date: 09/10/21 Weight reported at last virtual office visit: 202 lbs on 11/05/22 Today's reported weight (12/14/22):  201 lbs Total weight loss: 13 lbs Weight change since last visit: -1 lbs  Current exercise:  jogging 2 times per week. Walking 2 times per week.   Interim History:  She is trying to make good choices. Monday through Friday she is trying to follow cat 2 plan. Substituting dairy for meat because she is tired of meat. Dinner is healthy- protein and vegetables. She is going to the beach for a week in [place] [facility] [place]). Cravings and hunger are better controlled.    Assessment/Plan:  1. Other depression/emotional eating Notices more help with mood rather than cravings from bupropion.  Medication(s): Bupropion SR 100 mg twice daily- only taking once daily  Plan: Discontinue bupropion and start Contrave after receiving the Contrave.  2. Vitamin D Deficiency Vitamin D is not at goal of 50.  Most recent vitamin D level was 32. She is on  prescription ergocalciferol 50,000 IU weekly. Lab Results  Component Value Date   VD25OH 32.0 10/14/2022   VD25OH 30.7 06/24/2022   VD25OH 22.9 (L) 09/10/2021    Plan: Continue and refill  prescription ergocalciferol 50,000 IU weekly   3. Generalized Obesity: Current BMI 35  Pharmacotherapy Plan Start Contrave 8-90 mg tablet: Start 1 tablet every morning for 7 days, then 1 tablet twice daily for 7  days, then 2 tablets every morning and one every evening for 7 days, then 2 tablets twice daily thereafter. We went over titration instructions.  Advised her to hold at current dose if she experiences side effects. Denies history of glaucoma or seizure disorder.  Jamie Nelson is currently in the action stage of change. As such, her goal is to continue with weight loss efforts.  She has agreed to the Category 2 plan and practicing portion control and making smarter food choices, such as increasing vegetables and decreasing simple carbohydrates.  Exercise goals:  as is  Behavioral modification strategies: increasing lean protein intake, decreasing simple carbohydrates , and planning for success.  Jamie Nelson has agreed to follow-up with our clinic in 4 weeks.  No orders of the defined types were placed in this encounter.   Medications Discontinued During This Encounter  Medication Reason   Vitamin D, Ergocalciferol, (DRISDOL) 1.25 MG (50000 UNIT) CAPS capsule Reorder   buPROPion ER (WELLBUTRIN SR) 100 MG 12 hr tablet    Naltrexone-buPROPion HCl ER (CONTRAVE) 8-90 MG TB12      Meds ordered this encounter  Medications   Vitamin D, Ergocalciferol, (DRISDOL) 1.25 MG (50000 UNIT) CAPS capsule    Sig: Take 1 capsule (50,000 Units total) by mouth every 7 (seven) days.    Dispense:  12 capsule    Refill:  0    Order Specific Question:   Supervising Provider    Answer:   Glennis Brink [[id number]]   DISCONTD: Naltrexone-buPROPion HCl ER (CONTRAVE) 8-90 MG TB12    Sig: Take 1 tablet by  mouth every morning for 7 days, THEN 1 tablet twice daily for 7 days, then 2 tablets every morning and one every evening thereafter    Dispense:  120 tablet    Refill:  0    Order Specific Question:   Supervising Provider    Answer:   Glennis Brink [[id number]]   Naltrexone-buPROPion HCl ER (CONTRAVE) 8-90 MG TB12    Sig: Start 1 tablet every morning for 7 days, then 1 tablet twice daily for 7 days, then 2 tablets every morning and  one every evening for 7 days, then 2 tablets twice daily thereafter.    Dispense:  120 tablet    Refill:  0    Order Specific Question:   Supervising Provider    Answer:   Glennis Brink [[id number]]      Objective:   VITALS: Per patient if applicable, see vitals. GENERAL: Alert and in no acute distress. CARDIOPULMONARY: No increased WOB. Speaking in clear sentences.  PSYCH: Pleasant and cooperative. Speech normal rate and rhythm. Affect is appropriate. Insight and judgement are appropriate. Attention is focused, linear, and appropriate.  NEURO: Oriented as arrived to appointment on time with no prompting.   Attestation Statements:   Reviewed by clinician on day of visit: allergies, medications, problem list, medical history, surgical history, family history, social history, and previous encounter notes.  This was prepared with the assistance of [employer].  Occasional wrong-word or sound-a-like substitutions may have occurred due to the inherent limitations of voice recognition software.

## 2022-12-14 ENCOUNTER — Encounter (INDEPENDENT_AMBULATORY_CARE_PROVIDER_SITE_OTHER): Payer: Self-pay | Admitting: Family Medicine

## 2022-12-14 ENCOUNTER — Telehealth (INDEPENDENT_AMBULATORY_CARE_PROVIDER_SITE_OTHER): Payer: 59 | Admitting: Family Medicine

## 2022-12-14 ENCOUNTER — Telehealth: Payer: Self-pay

## 2022-12-14 ENCOUNTER — Other Ambulatory Visit (HOSPITAL_COMMUNITY): Payer: Self-pay

## 2022-12-14 DIAGNOSIS — E669 Obesity, unspecified: Secondary | ICD-10-CM

## 2022-12-14 DIAGNOSIS — F3289 Other specified depressive episodes: Secondary | ICD-10-CM | POA: Diagnosis not present

## 2022-12-14 DIAGNOSIS — Z6835 Body mass index (BMI) 35.0-35.9, adult: Secondary | ICD-10-CM

## 2022-12-14 DIAGNOSIS — E559 Vitamin D deficiency, unspecified: Secondary | ICD-10-CM | POA: Diagnosis not present

## 2022-12-14 MED ORDER — CONTRAVE 8-90 MG PO TB12
ORAL_TABLET | ORAL | 0 refills | Status: DC
  Filled 2022-12-14: qty 78, 30d supply, fill #0

## 2022-12-14 MED ORDER — CONTRAVE 8-90 MG PO TB12
ORAL_TABLET | ORAL | 0 refills | Status: DC
  Filled 2022-12-14: qty 120, 30d supply, fill #0

## 2022-12-14 MED ORDER — VITAMIN D (ERGOCALCIFEROL) 1.25 MG (50000 UNIT) PO CAPS
50000.0000 [IU] | ORAL_CAPSULE | ORAL | 0 refills | Status: DC
  Filled 2022-12-14: qty 12, 84d supply, fill #0

## 2022-12-14 NOTE — Telephone Encounter (Signed)
PA submitted through Cover My Meds for Contrave. Awaiting insurance determination. Key: [id number]

## 2022-12-16 ENCOUNTER — Telehealth (INDEPENDENT_AMBULATORY_CARE_PROVIDER_SITE_OTHER): Payer: Self-pay

## 2022-12-16 NOTE — Telephone Encounter (Signed)
Prior auth for Contrave:  The request has been approved. The authorization is effective from 01/11/2023 to 04/11/2023, as long as the member is enrolled in their current health plan.  Patient has been notified.

## 2022-12-23 ENCOUNTER — Ambulatory Visit (INDEPENDENT_AMBULATORY_CARE_PROVIDER_SITE_OTHER): Payer: 59 | Admitting: Family Medicine

## 2022-12-25 ENCOUNTER — Other Ambulatory Visit (HOSPITAL_COMMUNITY): Payer: Self-pay

## 2022-12-28 ENCOUNTER — Telehealth: Payer: 59 | Admitting: Nurse Practitioner

## 2022-12-28 DIAGNOSIS — R3989 Other symptoms and signs involving the genitourinary system: Secondary | ICD-10-CM

## 2022-12-28 MED ORDER — CEPHALEXIN 500 MG PO CAPS: 500.0000 mg | ORAL_CAPSULE | Freq: Two times a day (BID) | ORAL | 0 refills | Status: AC

## 2022-12-28 NOTE — Progress Notes (Signed)

## 2023-01-04 ENCOUNTER — Other Ambulatory Visit (HOSPITAL_COMMUNITY): Payer: Self-pay

## 2023-01-07 NOTE — Progress Notes (Deleted)
.            TeleHealth Visit:  This visit was completed with telemedicine (audio/video) technology. Jamie Nelson has verbally consented to this TeleHealth visit. The patient is located at home, the provider is located at home. The participants in this visit include the listed provider and patient. The visit was conducted today via MyChart video.  OBESITY Jamie Nelson is here to discuss her progress with her obesity treatment plan along with follow-up of her obesity related diagnoses.    Today's visit was # 14  Starting weight: 215 lbs Starting date: 09/10/21 Weight reported at last virtual office visit: 201 lbs on 12/14/22 Today's reported weight (***): {dwwweightreported:29243} Weight at clinic on ***: *** lbs Total weight loss: *** lbs Weight change since last visit: *** lbs  Nutrition Plan: the Category 2 plan - ***% adherence.  Current exercise: {exercise types:16438}  jogging 2 times per week. Walking 2 times per week.   Interim History:  ***  {dwwnutritionassessment:28854}  Eating all of the prescribed protein: {yes***/no:17258} Skipping meals: {dwwyes:29172} Drinking adequate water: {dwwyes:29172} Drinking sugar sweetened beverages: {dwwyes:29172} Hunger controlled: {EWCONTROLASSESSMENT:24261}. Cravings controlled:  {EWCONTROLASSESSMENT:24261}.  Journaling Consistently:  {dwwyes:29172} Meeting protein goals:  {dwwyes:29172} Meeting calorie goals:  {dwwyes:29172}  Pharmacotherapy: Amandia is on {dwwglp:29109}. Adverse side effects: {dwwse:29122} Hunger is {EWCONTROLASSESSMENT:24261}.  Assessment/Plan:  1. ***  2. ***  3. ***  {dwwmorbid:29108::"Morbid Obesity"}: Current BMI ***  Pharmacotherapy Plan {dwwmed:29123} {dwwglp:29109}.   Fred {CHL AMB IS/IS NOT:210130109} currently in the action stage of change. As such, her goal is to {MWMwtloss#1:210800005}.  She has agreed to {dwwsldiets:29085}.  Exercise goals: {MWM EXERCISE RECS:23473}  Behavioral modification  strategies: {dwwslwtlossstrategies:29088}.  [name] has agreed to follow-up with our clinic in {NUMBER 1-10:22536} {dwwfutime:29619}  No orders of the defined types were placed in this encounter.   There are no discontinued medications.   No orders of the defined types were placed in this encounter.     Objective:   VITALS: Per patient if applicable, see vitals. GENERAL: Alert and in no acute distress. CARDIOPULMONARY: No increased WOB. Speaking in clear sentences.  PSYCH: Pleasant and cooperative. Speech normal rate and rhythm. Affect is appropriate. Insight and judgement are appropriate. Attention is focused, linear, and appropriate.  NEURO: Oriented as arrived to appointment on time with no prompting.   Attestation Statements:   Reviewed by clinician on day of visit: allergies, medications, problem list, medical history, surgical history, family history, social history, and previous encounter notes.  ***(delete if time-based billing not used) Time spent on visit including the items listed below was *** minutes.  -preparing to see the patient (e.g., review of tests, history, previous notes) -obtaining and/or reviewing separately obtained history -counseling and educating the patient/family/caregiver -documenting clinical information in the electronic or other health record -ordering medications, tests, or procedures -independently interpreting results and communicating results to the patient/ family/caregiver -referring and communicating with other health care professionals  -care coordination    This was prepared with the assistance of [employer].  Occasional wrong-word or sound-a-like substitutions may have occurred due to the inherent limitations of voice recognition software.

## 2023-01-11 ENCOUNTER — Encounter (INDEPENDENT_AMBULATORY_CARE_PROVIDER_SITE_OTHER): Payer: Self-pay | Admitting: Family Medicine

## 2023-01-11 ENCOUNTER — Telehealth (INDEPENDENT_AMBULATORY_CARE_PROVIDER_SITE_OTHER): Payer: 59 | Admitting: Family Medicine

## 2023-01-11 DIAGNOSIS — Z6835 Body mass index (BMI) 35.0-35.9, adult: Secondary | ICD-10-CM

## 2023-01-11 DIAGNOSIS — E669 Obesity, unspecified: Secondary | ICD-10-CM

## 2023-01-20 ENCOUNTER — Other Ambulatory Visit (HOSPITAL_COMMUNITY): Payer: Self-pay

## 2023-01-21 ENCOUNTER — Other Ambulatory Visit: Payer: Self-pay | Admitting: Family Medicine

## 2023-01-21 DIAGNOSIS — Z1231 Encounter for screening mammogram for malignant neoplasm of breast: Secondary | ICD-10-CM

## 2023-01-22 DIAGNOSIS — F411 Generalized anxiety disorder: Secondary | ICD-10-CM | POA: Diagnosis not present

## 2023-01-27 ENCOUNTER — Ambulatory Visit: Payer: Self-pay | Admitting: General Surgery

## 2023-01-27 DIAGNOSIS — K603 Anal fistula: Secondary | ICD-10-CM | POA: Diagnosis not present

## 2023-01-27 DIAGNOSIS — Z1231 Encounter for screening mammogram for malignant neoplasm of breast: Secondary | ICD-10-CM

## 2023-01-27 NOTE — H&P (Signed)
PROVIDER:  Elenora Gamma, MD  MRN: [id number] DOB: [birth year] DATE OF ENCOUNTER: 01/27/2023  Subjective   Chief Complaint: NEW PROBLEM (Anal Fistula)     History of Present Illness: Jamie Nelson is a 42 y.o. 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  Intraoperatively it was noted to track cephalad and medially and seems to be consistent with Bartholin's gland cyst.  Pathology just showed inflammatory tissue.  She did have a preoperative MRI which showed no communication to the anus or rectum.  She then underwent an exam under anesthesia for a possible vaginal perineal fistula.  This was done in October 2023.  A fistula was noted and upon injection of methylene blue dye this appeared to track to the anterior distal rectum.  Fibrin glue was used on the fistula but this did not seal the tissue.  Recently during prep for a colonoscopy she noticed worsening pain in the area as well as drainage.  A repeat MRI was ordered.  This showed some postsurgical change as well as some stranding in the surgical bed and a persistent track extending from the skin surface to the right anterior aspect of the vagina.   Review of Systems: A complete review of systems was obtained from the patient.  I have reviewed this information and discussed as appropriate with the patient.  See HPI as well for other ROS.    Medical History: Past Medical History:  Diagnosis Date   Anxiety    Glaucoma (increased eye pressure)     There is no problem list on file for this patient.   Past Surgical History:  Procedure Laterality Date   c-section x3     knee surgery     liver cyst removal surgery       Allergies  Allergen Reactions   Macrobid [Nitrofurantoin Monohyd/M-Cryst] Hives and Other (See Comments)    SOB   Sulfa (Sulfonamide Antibiotics) Itching     Current Outpatient Medications on File Prior to Visit  Medication Sig Dispense Refill   buPROPion (WELLBUTRIN SR) 100 MG SR tablet      metFORMIN (GLUCOPHAGE-XR) 500 MG XR tablet Take 2 tablets by mouth 2 (two) times daily     sertraline (ZOLOFT) 100 MG tablet Take 100 mg by mouth once daily (Patient not taking: Reported on 01/27/2023)     spironolactone (ALDACTONE) 50 MG tablet TAKE 1 TABLET (50 MG TOTAL) BY MOUTH TWICE A DAY     No current facility-administered medications on file prior to visit.    Family History  Problem Relation Age of Onset   High blood pressure (Hypertension) Mother    Colon cancer Father      Social History   Tobacco Use  Smoking Status Never  Smokeless Tobacco Never     Social History   Socioeconomic History   Marital status: Married  Tobacco Use   Smoking status: Never   Smokeless tobacco: Never  Vaping Use   Vaping status: Never Used  Substance and Sexual Activity   Alcohol use: Yes   Drug use: Never    Objective:    Vitals:   01/27/23 0955  BP: (!) 144/81  Pulse: 82  Temp: 36.8 C (98.3 F)  SpO2: 97%  Weight: 92.2 kg (203 lb 3.2 oz)  Height: 165.1 cm (5\' 5" )  PainSc: 0-No pain  Exam Gen: NAD  Rectal: External opening noted at right anterior perianal area   Labs, Imaging and Diagnostic Testing: MRI reviewed, Previous OP notes reviewed, Dr Derrill Memo note removed  Assessment and Plan:  Diagnoses and all orders for this visit:  Anal fistula     42 year old female with chronic perineal wound status post 2 surgeries.  On her last surgery she was noted to have a fistula tracking to the distal anal canal.  This appears to an anal rectal fistula.  I have suggested exam under anesthesia with evaluation of the fistula tract and fistulotomy versus seton depending on how deep the fistula extends.  We have discussed this in detail including indications and recurrence rates for both.  We discussed typical postoperative pain and  time off of work.  All questions were answered.  Vanita Panda, MD Colon and Rectal Surgery [facility]

## 2023-02-08 ENCOUNTER — Other Ambulatory Visit (HOSPITAL_COMMUNITY): Payer: Self-pay

## 2023-02-08 ENCOUNTER — Telehealth: Payer: 59 | Admitting: Physician Assistant

## 2023-02-08 DIAGNOSIS — R3989 Other symptoms and signs involving the genitourinary system: Secondary | ICD-10-CM

## 2023-02-08 MED ORDER — CEPHALEXIN 500 MG PO CAPS
500.0000 mg | ORAL_CAPSULE | Freq: Two times a day (BID) | ORAL | 0 refills | Status: DC
Start: 2023-02-08 — End: 2023-04-30
  Filled 2023-02-08: qty 14, 7d supply, fill #0

## 2023-02-08 NOTE — Progress Notes (Signed)

## 2023-02-25 ENCOUNTER — Telehealth: Payer: 59 | Admitting: Physician Assistant

## 2023-02-25 DIAGNOSIS — N390 Urinary tract infection, site not specified: Secondary | ICD-10-CM

## 2023-02-25 NOTE — Progress Notes (Signed)
Because you have been treated via e-visit in the past few weeks for a UTI and with recurring symptoms need a urine culture, I feel your condition warrants further evaluation and I recommend that you be seen in a face to face visit.   NOTE: There will be NO CHARGE for this eVisit   If you are having a true medical emergency please call 911.      For an urgent face to face visit, [place] has eight urgent care centers for your convenience:   NEW!! [facility] Center at [facility] Get Driving Directions [phone] [address] [place], [address]    [facility] Health Urgent Care Center at [facility] Get Driving Directions [phone] [address] [place], [place] [address]   [facility] Health Urgent Care Center [facility]) Get Driving Directions [phone] [address] [place], [place] [address]  [facility] Health Urgent Care Center [facility]) Get Driving Directions [phone] [address] [place],  [place]  [address]  [facility] Health Urgent Care Center [facility] - at [employer]  [phone] [address] [place],  [place] [address]   [facility] Health Urgent Care at [facility] Get Driving Directions [phone] [address] [place], [place] [address]   [facility] Health Urgent Care at [facility] Get Driving Directions  [phone] [address] [place], [place] [address]   [facility] Health Urgent Care at [facility] Directions [phone] [address] [place], [place] [address]  Your MyChart E-visit questionnaire answers were reviewed by a board certified advanced clinical practitioner to complete your personal care plan based on your specific symptoms.  Thank you for using e-Visits.

## 2023-03-17 ENCOUNTER — Ambulatory Visit: Payer: 59

## 2023-04-07 DIAGNOSIS — H5213 Myopia, bilateral: Secondary | ICD-10-CM | POA: Diagnosis not present

## 2023-04-13 ENCOUNTER — Ambulatory Visit: Payer: 59

## 2023-04-20 ENCOUNTER — Encounter (HOSPITAL_BASED_OUTPATIENT_CLINIC_OR_DEPARTMENT_OTHER): Payer: Self-pay | Admitting: General Surgery

## 2023-04-20 ENCOUNTER — Other Ambulatory Visit (HOSPITAL_COMMUNITY): Payer: Self-pay

## 2023-04-20 DIAGNOSIS — F411 Generalized anxiety disorder: Secondary | ICD-10-CM | POA: Diagnosis not present

## 2023-04-20 NOTE — Progress Notes (Signed)
Spoke w/ via phone for pre-op interview--- Juanice Lab needs dos----  UPT and ISTAT per anesthesia       Lab results------ COVID test -----patient states asymptomatic no test needed Arrive at -------0530 NPO after MN NO Solid Food.   Med rec completed Medications to take morning of surgery -----NONE Diabetic medication ----- Patient instructed no nail polish to be worn day of surgery Patient instructed to bring photo id and insurance card day of surgery Patient aware to have Driver (ride ) / caregiver    for 24 hours after surgery - Husband Curtis Sites Patient Special Instructions ----- Pre-Op special Instructions ----- Patient verbalized understanding of instructions that were given at this phone interview. Patient denies chest pain, sob, fever, cough at the interview.

## 2023-04-30 ENCOUNTER — Encounter (HOSPITAL_BASED_OUTPATIENT_CLINIC_OR_DEPARTMENT_OTHER): Payer: Self-pay | Admitting: General Surgery

## 2023-04-30 ENCOUNTER — Other Ambulatory Visit: Payer: Self-pay

## 2023-04-30 ENCOUNTER — Other Ambulatory Visit (HOSPITAL_COMMUNITY): Payer: Self-pay

## 2023-04-30 ENCOUNTER — Ambulatory Visit (HOSPITAL_BASED_OUTPATIENT_CLINIC_OR_DEPARTMENT_OTHER)
Admission: RE | Admit: 2023-04-30 | Discharge: 2023-04-30 | Disposition: A | Discharge status: Home / Self Care | Payer: 59 | Attending: General Surgery | Admitting: General Surgery

## 2023-04-30 ENCOUNTER — Ambulatory Visit (HOSPITAL_BASED_OUTPATIENT_CLINIC_OR_DEPARTMENT_OTHER): Payer: 59 | Admitting: Anesthesiology

## 2023-04-30 ENCOUNTER — Encounter (HOSPITAL_BASED_OUTPATIENT_CLINIC_OR_DEPARTMENT_OTHER)
Admission: RE | Disposition: A | Discharge status: Home / Self Care | Payer: Self-pay | Source: Home / Self Care | Attending: General Surgery

## 2023-04-30 DIAGNOSIS — K60313 Anal fistula, simple, recurrent: Secondary | ICD-10-CM | POA: Diagnosis not present

## 2023-04-30 DIAGNOSIS — K603 Anal fistula, unspecified: Secondary | ICD-10-CM | POA: Insufficient documentation

## 2023-04-30 DIAGNOSIS — Z01818 Encounter for other preprocedural examination: Secondary | ICD-10-CM

## 2023-04-30 DIAGNOSIS — K219 Gastro-esophageal reflux disease without esophagitis: Secondary | ICD-10-CM | POA: Insufficient documentation

## 2023-04-30 DIAGNOSIS — K529 Noninfective gastroenteritis and colitis, unspecified: Secondary | ICD-10-CM | POA: Diagnosis not present

## 2023-04-30 DIAGNOSIS — L929 Granulomatous disorder of the skin and subcutaneous tissue, unspecified: Secondary | ICD-10-CM | POA: Diagnosis not present

## 2023-04-30 HISTORY — PX: FISTULOTOMY: SHX6413

## 2023-04-30 HISTORY — PX: LIGATION OF INTERNAL FISTULA TRACT: SHX6551

## 2023-04-30 HISTORY — PX: RECTAL EXAM UNDER ANESTHESIA: SHX6399

## 2023-04-30 LAB — POCT I-STAT, CHEM 8
BUN: 14 mg/dL (ref 6–20)
Calcium, Ion: 1.28 mmol/L (ref 1.15–1.40)
Chloride: 106 mmol/L (ref 98–111)
Creatinine, Ser: 0.8 mg/dL (ref 0.44–1.00)
Glucose, Bld: 88 mg/dL (ref 70–99)
HCT: 46 % (ref 36.0–46.0)
Hemoglobin: 15.6 g/dL — ABNORMAL HIGH (ref 12.0–15.0)
Potassium: 3.9 mmol/L (ref 3.5–5.1)
Sodium: 141 mmol/L (ref 135–145)
TCO2: 22 mmol/L (ref 22–32)

## 2023-04-30 LAB — POCT PREGNANCY, URINE: Preg Test, Ur: NEGATIVE

## 2023-04-30 SURGERY — FISTULOTOMY
Anesthesia: Monitor Anesthesia Care | Site: Rectum

## 2023-04-30 MED ORDER — DEXMEDETOMIDINE HCL IN NACL 80 MCG/20ML IV SOLN
INTRAVENOUS | Status: DC | PRN
  Administered 2023-04-30: 8 ug via INTRAVENOUS

## 2023-04-30 MED ORDER — PROPOFOL 500 MG/50ML IV EMUL
INTRAVENOUS | Status: AC
  Filled 2023-04-30: qty 50

## 2023-04-30 MED ORDER — FENTANYL CITRATE (PF) 100 MCG/2ML IJ SOLN
INTRAMUSCULAR | Status: AC
  Filled 2023-04-30: qty 2

## 2023-04-30 MED ORDER — FENTANYL CITRATE (PF) 250 MCG/5ML IJ SOLN
INTRAMUSCULAR | Status: DC | PRN
  Administered 2023-04-30: 25 ug via INTRAVENOUS
  Administered 2023-04-30 (×2): 50 ug via INTRAVENOUS

## 2023-04-30 MED ORDER — ACETAMINOPHEN 500 MG PO TABS
1000.0000 mg | ORAL_TABLET | Freq: Once | ORAL | Status: AC
  Administered 2023-04-30: 1000 mg via ORAL

## 2023-04-30 MED ORDER — ACETAMINOPHEN 500 MG PO TABS
ORAL_TABLET | ORAL | Status: AC
  Filled 2023-04-30: qty 2

## 2023-04-30 MED ORDER — LIDOCAINE 2% (20 MG/ML) 5 ML SYRINGE
INTRAMUSCULAR | Status: DC | PRN
  Administered 2023-04-30: 40 mg via INTRAVENOUS

## 2023-04-30 MED ORDER — PROPOFOL 500 MG/50ML IV EMUL
INTRAVENOUS | Status: DC | PRN
  Administered 2023-04-30: 200 ug/kg/min via INTRAVENOUS

## 2023-04-30 MED ORDER — GABAPENTIN 300 MG PO CAPS
ORAL_CAPSULE | ORAL | Status: AC
  Filled 2023-04-30: qty 1

## 2023-04-30 MED ORDER — SODIUM CHLORIDE 0.9 % IV SOLN: INTRAVENOUS | Status: DC

## 2023-04-30 MED ORDER — MIDAZOLAM HCL 5 MG/5ML IJ SOLN
INTRAMUSCULAR | Status: DC | PRN
  Administered 2023-04-30: 2 mg via INTRAVENOUS

## 2023-04-30 MED ORDER — OXYCODONE HCL 5 MG/5ML PO SOLN: 5.0000 mg | Freq: Once | ORAL | Status: DC | PRN

## 2023-04-30 MED ORDER — ONDANSETRON HCL 4 MG/2ML IJ SOLN
INTRAMUSCULAR | Status: DC | PRN
  Administered 2023-04-30: 4 mg via INTRAVENOUS

## 2023-04-30 MED ORDER — FENTANYL CITRATE (PF) 100 MCG/2ML IJ SOLN: 25.0000 ug | INTRAMUSCULAR | Status: DC | PRN

## 2023-04-30 MED ORDER — MIDAZOLAM HCL 2 MG/2ML IJ SOLN
INTRAMUSCULAR | Status: AC
  Filled 2023-04-30: qty 2

## 2023-04-30 MED ORDER — BUPIVACAINE-EPINEPHRINE 0.5% -1:200000 IJ SOLN
INTRAMUSCULAR | Status: DC | PRN
  Administered 2023-04-30: 30 mL

## 2023-04-30 MED ORDER — PROPOFOL 10 MG/ML IV BOLUS
INTRAVENOUS | Status: DC | PRN
  Administered 2023-04-30 (×3): 20 mg via INTRAVENOUS

## 2023-04-30 MED ORDER — AMISULPRIDE (ANTIEMETIC) 5 MG/2ML IV SOLN: 10.0000 mg | Freq: Once | INTRAVENOUS | Status: DC | PRN

## 2023-04-30 MED ORDER — PROPOFOL 10 MG/ML IV BOLUS
INTRAVENOUS | Status: AC
  Filled 2023-04-30: qty 20

## 2023-04-30 MED ORDER — GABAPENTIN 300 MG PO CAPS
300.0000 mg | ORAL_CAPSULE | ORAL | Status: AC
  Administered 2023-04-30: 300 mg via ORAL

## 2023-04-30 MED ORDER — OXYCODONE HCL 5 MG PO TABS: 5.0000 mg | ORAL_TABLET | Freq: Once | ORAL | Status: DC | PRN

## 2023-04-30 MED ORDER — LIDOCAINE HCL (PF) 2 % IJ SOLN
INTRAMUSCULAR | Status: AC
  Filled 2023-04-30: qty 5

## 2023-04-30 MED ORDER — TRAMADOL HCL 50 MG PO TABS
50.0000 mg | ORAL_TABLET | Freq: Four times a day (QID) | ORAL | 0 refills | Status: DC | PRN
  Filled 2023-04-30: qty 30, 4d supply, fill #0

## 2023-04-30 MED ORDER — ONDANSETRON HCL 4 MG/2ML IJ SOLN
INTRAMUSCULAR | Status: AC
  Filled 2023-04-30: qty 4

## 2023-04-30 MED ORDER — SODIUM CHLORIDE 0.9% FLUSH: 3.0000 mL | Freq: Two times a day (BID) | INTRAVENOUS | Status: DC

## 2023-04-30 MED ORDER — HYDROGEN PEROXIDE 3 % EX SOLN
CUTANEOUS | Status: DC | PRN
Start: 2023-04-30 — End: 2023-04-30
  Administered 2023-04-30: 1

## 2023-04-30 MED ORDER — LACTATED RINGERS IV SOLN: INTRAVENOUS | Status: DC

## 2023-04-30 MED ORDER — GLYCOPYRROLATE PF 0.2 MG/ML IJ SOSY
PREFILLED_SYRINGE | INTRAMUSCULAR | Status: AC
  Filled 2023-04-30: qty 1

## 2023-04-30 SURGICAL SUPPLY — 61 items
BENZOIN TINCTURE PRP APPL 2/3 (GAUZE/BANDAGES/DRESSINGS) ×2 IMPLANT
BLADE EXTENDED COATED 6.5IN (ELECTRODE) IMPLANT
BLADE SURG 10 STRL SS (BLADE) IMPLANT
BRIEF MESH DISP LRG (UNDERPADS AND DIAPERS) ×1 IMPLANT
COVER BACK TABLE 60X90IN (DRAPES) ×1 IMPLANT
COVER MAYO STAND STRL (DRAPES) ×1 IMPLANT
DRAPE HYSTEROSCOPY (MISCELLANEOUS) IMPLANT
DRAPE LAPAROTOMY 100X72 PEDS (DRAPES) ×1 IMPLANT
DRAPE SHEET LG 3/4 BI-LAMINATE (DRAPES) IMPLANT
DRAPE UTILITY XL STRL (DRAPES) ×1 IMPLANT
ELECT REM PT RETURN 9FT ADLT (ELECTROSURGICAL) ×1
ELECTRODE REM PT RTRN 9FT ADLT (ELECTROSURGICAL) ×1 IMPLANT
GAUZE 4X4 16PLY ~~LOC~~+RFID DBL (SPONGE) ×1 IMPLANT
GAUZE PAD ABD 8X10 STRL (GAUZE/BANDAGES/DRESSINGS) ×1 IMPLANT
GAUZE SPONGE 4X4 12PLY STRL (GAUZE/BANDAGES/DRESSINGS) ×1 IMPLANT
GLOVE BIO SURGEON STRL SZ 6.5 (GLOVE) ×1 IMPLANT
GLOVE BIOGEL PI IND STRL 7.0 (GLOVE) ×1 IMPLANT
GLOVE INDICATOR 6.5 STRL GRN (GLOVE) ×1 IMPLANT
GOWN STRL REUS W/TWL XL LVL3 (GOWN DISPOSABLE) ×1 IMPLANT
HYDROGEN PEROXIDE 16OZ (MISCELLANEOUS) ×1 IMPLANT
IV CATH 14GX2 1/4 (CATHETERS) ×1 IMPLANT
IV CATH 18G SAFETY (IV SOLUTION) ×1 IMPLANT
KIT SIGMOIDOSCOPE (SET/KITS/TRAYS/PACK) IMPLANT
KIT TURNOVER CYSTO (KITS) ×1 IMPLANT
LEGGING LITHOTOMY PAIR STRL (DRAPES) IMPLANT
LOOP VASCLR MAXI BLUE 18IN ST (MISCELLANEOUS) IMPLANT
NDL HYPO 22X1.5 SAFETY MO (MISCELLANEOUS) ×1 IMPLANT
NEEDLE HYPO 22X1.5 SAFETY MO (MISCELLANEOUS) ×1
NS IRRIG 500ML POUR BTL (IV SOLUTION) ×1 IMPLANT
PACK BASIN DAY SURGERY FS (CUSTOM PROCEDURE TRAY) ×1 IMPLANT
PAD ARMBOARD 7.5X6 YLW CONV (MISCELLANEOUS) IMPLANT
PENCIL SMOKE EVACUATOR (MISCELLANEOUS) ×1 IMPLANT
RETRACTOR STAY HOOK 5MM (MISCELLANEOUS) IMPLANT
RETRACTOR STERILE 25.8CMX11.3 (INSTRUMENTS) IMPLANT
SLEEVE SCD COMPRESS KNEE MED (STOCKING) ×1 IMPLANT
SPIKE FLUID TRANSFER (MISCELLANEOUS) ×1 IMPLANT
SPONGE HEMORRHOID 8X3CM (HEMOSTASIS) IMPLANT
SPONGE SURGIFOAM ABS GEL 12-7 (HEMOSTASIS) IMPLANT
SUCTION TUBE FRAZIER 10FR DISP (SUCTIONS) IMPLANT
SUT CHROMIC 2 0 SH (SUTURE) ×2 IMPLANT
SUT CHROMIC 3 0 SH 27 (SUTURE) IMPLANT
SUT ETHIBOND 0 (SUTURE) IMPLANT
SUT SILK 2 0 SH CR/8 (SUTURE) ×1 IMPLANT
SUT SILK 2-0 18XBRD TIE 12 (SUTURE) ×1 IMPLANT
SUT SILK 3 0 SH 30 (SUTURE) IMPLANT
SUT SILK 3 0 SH CR/8 (SUTURE) IMPLANT
SUT VIC AB 2-0 SH 27XBRD (SUTURE) IMPLANT
SUT VIC AB 3-0 SH 18 (SUTURE) IMPLANT
SUT VIC AB 3-0 SH 27X BRD (SUTURE) IMPLANT
SUT VIC AB 3-0 SH 27XBRD (SUTURE) IMPLANT
SUT VIC AB 3-0 SH 8-18 (SUTURE) ×1 IMPLANT
SUT VICRYL 3 0 UR 6 27 (SUTURE) IMPLANT
SYR 10ML LL (SYRINGE) ×1 IMPLANT
SYR BULB IRRIG 60ML STRL (SYRINGE) IMPLANT
SYR CONTROL 10ML LL (SYRINGE) ×1 IMPLANT
TIE VASCULAR MAXI BLUE 18IN ST (MISCELLANEOUS)
TOWEL OR 17X24 6PK STRL BLUE (TOWEL DISPOSABLE) ×1 IMPLANT
TRAY DSU PREP LF (CUSTOM PROCEDURE TRAY) ×1 IMPLANT
TUBE CONNECTING 12X1/4 (SUCTIONS) ×1 IMPLANT
VASCULAR TIE MAXI BLUE 18IN ST (MISCELLANEOUS)
YANKAUER SUCT BULB TIP NO VENT (SUCTIONS) ×1 IMPLANT

## 2023-04-30 NOTE — Discharge Instructions (Addendum)
ANORECTAL SURGERY: POST OP INSTRUCTIONS Take your usually prescribed home medications unless otherwise directed. DIET: During the first few hours after surgery sip on some liquids until you are able to urinate.  It is normal to not urinate for several hours after this surgery.  If you feel uncomfortable, please contact the office for instructions.  After you are able to urinate,you may eat, if you feel like it.  Follow a light bland diet the first 24 hours after arrival home, such as soup, liquids, crackers, etc.  Be sure to include lots of fluids daily (6-8 glasses).  Avoid fast food or heavy meals, as your are more likely to get nauseated.  Eat a low fat diet the next few days after surgery.  Limit caffeine intake to 1-2 servings a day. PAIN CONTROL: Pain is best controlled by a usual combination of several different methods TOGETHER: Muscle relaxation: Soak in a warm bath (or Sitz bath) three times a day and after bowel movements.  Continue to do this until all pain is resolved. Over the counter pain medication No acetaminophen/Tylenol until after 12:45 pm today if needed.  Prescription pain medication Most patients will experience some swelling and discomfort in the anus/rectal area and incisions.  Heat such as warm towels, sitz baths, warm baths, etc to help relax tight/sore spots and speed recovery.  Some people prefer to use ice, especially in the first couple days after surgery, as it may decrease the pain and swelling, or alternate between ice & heat.  Experiment to what works for you.  Swelling and bruising can take several weeks to resolve.  Pain can take even longer to completely resolve. It is helpful to take an over-the-counter pain medication regularly for the first few weeks.  Choose one of the following that works best for you: Naproxen (Aleve, etc)  Two 220mg  tabs twice a day Ibuprofen (Advil, etc) Three 200mg  tabs four times a day (every meal & bedtime) A  prescription for pain  medication (such as percocet, oxycodone, hydrocodone, etc) should be given to you upon discharge.  Take your pain medication as prescribed.  If you are having problems/concerns with the prescription medicine (does not control pain, nausea, vomiting, rash, itching, etc), please call us [phone] to see if we need to switch you to a different pain medicine that will work better for you and/or control your side effect better. If you need a refill on your pain medication, please contact your pharmacy.  They will contact our office to request authorization. Prescriptions will not be filled after 5 pm or on week-ends. KEEP YOUR BOWELS REGULAR and AVOID CONSTIPATION The goal is one to two soft bowel movements a day.  You should at least have a bowel movement every other day. Avoid getting constipated.  Between the surgery and the pain medications, it is common to experience some constipation. This can be very painful after rectal surgery.  Increasing fluid intake and taking a fiber supplement (such as Metamucil, Citrucel, FiberCon, etc) 1-2 times a day regularly will usually help prevent this problem from occurring.  A stool softener like colace is also recommended.  This can be purchased over the counter at your pharmacy.  You can take it up to 3 times a day.  If you do not have a bowel movement after 24 hrs since your surgery, take one does of milk of magnesia.  If you still haven't had a bowel movement 8-12 hours after that dose, take another dose.  If  you don't have a bowel movement 48 hrs after surgery, purchase a Fleets enema from the drug store and administer gently per package instructions.  If you still are having trouble with your bowel movements after that, please call the office for further instructions. If you develop diarrhea or have many loose bowel movements, simplify your diet to bland foods & liquids for a few days.  Stop any stool softeners and decrease your fiber supplement.  Switching to  mild anti-diarrheal medications (Kayopectate, Pepto Bismol) can help.  If this worsens or does not improve, please call us.  Wound Care Remove your bandages before your first bowel movement or 8 hours after surgery.     Remove any wound packing material at this tim,e as well.  You do not need to repack the wound unless instructed otherwise.  Wear an absorbent pad or soft cotton gauze in your underwear to catch any drainage and help keep the area clean. You should change this every 2-3 hours while awake. Keep the area clean and dry.  Bathe / shower every day, especially after bowel movements.  Keep the area clean by showering / bathing over the incision / wound.   It is okay to soak an open wound to help wash it.  Wet wipes or showers / gentle washing after bowel movements is often less traumatic than regular toilet paper. You may have some styrofoam-like soft packing in the rectum which will come out with the first bowel movement.  You will often notice bleeding with bowel movements.  This should slow down by the end of the first week of surgery Expect some drainage.  This should slow down, too, by the end of the first week of surgery.  Wear an absorbent pad or soft cotton gauze in your underwear until the drainage stops. Do Not sit on a rubber or pillow ring.  This can make you symptoms worse.  You may sit on a soft pillow if needed.  ACTIVITIES as tolerated:   You may resume regular (light) daily activities beginning the next day--such as daily self-care, walking, climbing stairs--gradually increasing activities as tolerated.  If you can walk 30 minutes without difficulty, it is safe to try more intense activity such as jogging, treadmill, bicycling, low-impact aerobics, swimming, etc. Save the most intensive and strenuous activity for last such as sit-ups, heavy lifting, contact sports, etc  Refrain from any heavy lifting or straining until you are off narcotics for pain control.   You may drive when  you are no longer taking prescription pain medication, you can comfortably sit for long periods of time, and you can safely maneuver your car and apply brakes. You may have sexual intercourse when it is comfortable.  FOLLOW UP in our office Please call [facility] at [phone] to set up an appointment to see your surgeon in the office for a follow-up appointment approximately 3-4 weeks after your surgery. Make sure that you call for this appointment the day you arrive home to insure a convenient appointment time. 10. IF YOU HAVE DISABILITY OR FAMILY LEAVE FORMS, BRING THEM TO THE OFFICE FOR PROCESSING.  DO NOT GIVE THEM TO YOUR DOCTOR.     WHEN TO CALL us [phone]: Poor pain control Reactions / problems with new medications (rash/itching, nausea, etc)  Fever over 101.5 F (38.5 C) Inability to urinate Nausea and/or vomiting Worsening swelling or bruising Continued bleeding from incision. Increased pain, redness, or drainage from the incision  The clinic staff is available  to answer your questions during regular business hours (8:30am-5pm).  Please don't hesitate to call and ask to speak to one of our nurses for clinical concerns.   A surgeon from [facility] is always on call at the hospitals   If you have a medical emergency, go to the nearest emergency room or call 911.    [facility] [address], [address], [place], [place]  [address] ? MAIN: [phone] ? TOLL FREE: [phone] ? FAX [phone] [email or web address]     Post Anesthesia Home Care Instructions  Activity: Get plenty of rest for the remainder of the day. A responsible individual must stay with you for 24 hours following the procedure.  For the next 24 hours, DO NOT: -Drive a car -Advertising copywriter -Drink alcoholic beverages -Take any medication unless instructed by your physician -Make any legal decisions or sign important papers.  Meals: Start  with liquid foods such as gelatin or soup. Progress to regular foods as tolerated. Avoid greasy, spicy, heavy foods. If nausea and/or vomiting occur, drink only clear liquids until the nausea and/or vomiting subsides. Call your physician if vomiting continues.  Special Instructions/Symptoms: Your throat may feel dry or sore from the anesthesia or the breathing tube placed in your throat during surgery. If this causes discomfort, gargle with warm salt water. The discomfort should disappear within 24 hours.  If you had a scopolamine patch placed behind your ear for the management of post- operative nausea and/or vomiting:  1. The medication in the patch is effective for 72 hours, after which it should be removed.  Wrap patch in a tissue and discard in the trash. Wash hands thoroughly with soap and water. 2. You may remove the patch earlier than 72 hours if you experience unpleasant side effects which may include dry mouth, dizziness or visual disturbances. 3. Avoid touching the patch. Wash your hands with soap and water after contact with the patch.

## 2023-04-30 NOTE — Transfer of Care (Signed)
Immediate Anesthesia Transfer of Care Note  Patient: Jamie Nelson  Procedure(s) Performed: SETON PLACEMENT (Rectum) RECTAL EXAM UNDER ANESTHESIA (Rectum) LIGATION OF ANAL FISTULA (Rectum)  Patient Location: PACU  Anesthesia Type:MAC  Level of Consciousness: awake, alert , oriented, and patient cooperative  Airway & Oxygen Therapy: Patient Spontanous Breathing  Post-op Assessment: Report given to RN and Post -op Vital signs reviewed and stable  Post vital signs: Reviewed and stable  Last Vitals:  Vitals Value Taken Time  BP 111/56 04/30/23 1001  Temp    Pulse 84 04/30/23 1003  Resp 19 04/30/23 1003  SpO2 96 % 04/30/23 1003  Vitals shown include unfiled device data.  Last Pain:  Vitals:   04/30/23 0642  TempSrc: Oral  PainSc: 0-No pain      Patients Stated Pain Goal: 3 (04/30/23 1610)  Complications: No notable events documented.

## 2023-04-30 NOTE — Op Note (Signed)
04/30/2023  8:49 AM  PATIENT:  Jamie Nelson  42 y.o. female  Patient Care Team: Sheliah Hatch, MD as PCP - General (Family Medicine) [name], CNM as Midwife Carlus Pavlov, MD as Consulting Physician (Internal Medicine)  PRE-OPERATIVE DIAGNOSIS:  ANAL FISTULA  POST-OPERATIVE DIAGNOSIS:  ANAL FISTULA  PROCEDURE:  INTERROGATION OF ANAL FISTULA  SETON PLACEMENT     Surgeon(s): Romie Levee, MD  ASSISTANT: none   ANESTHESIA:   local and MAC  SPECIMEN:  No Specimen  DISPOSITION OF SPECIMEN:  N/A  COUNTS:  YES  PLAN OF CARE: Discharge to home after PACU  PATIENT DISPOSITION:  PACU - hemodynamically stable.  INDICATION: 42 y.o. F with anal fistula   OR FINDINGS: rectovaginal scar tissue that communicated with the proximal anterior midline anal canal  DESCRIPTION: the patient was identified in the preoperative holding area and taken to the OR where they were laid on the operating room table.  MAC anesthesia was induced without difficulty. The patient was then positioned in prone jackknife position with buttocks gently taped apart.  The patient was then prepped and draped in usual sterile fashion.  SCDs were noted to be in place prior to the initiation of anesthesia. A surgical timeout was performed indicating the correct patient, procedure, positioning and need for preoperative antibiotics.  A rectal block was performed using Marcaine with epinephrine.    I began with a digital rectal exam.  There was a punctate area at the proximal anterior midline anal canal.  I then placed a Hill-Ferguson anoscope into the anal canal and evaluated this completely.  There was no other pathology.  I began by inserting an S shaped fistula probe into the external opening.  This only inserted approximately 2 to 3 mm.  I cut down on the area using electrocautery.  I identified what appeared to be a fistula tract but was unable to traverse this with a fistula probe.  I tried  injecting hydrogen peroxide into this area but I was unable to find the fistula tract that way as well.  I then decided to excise the fistula tract using Metzenbaum scissors.  I removed all of the scar and this continued towards the anterior anal canal.  Once I encountered external sphincter fibers, I evaluated the scar tissue again with a fistula probe.  I was then able to traverse the fistula tract and enter into the anterior midline punctate area which was the internal opening.  Given that this was in the proximal anal canal a seton was placed using an Ethibond suture to pull through a vessel loop.  The vessel loop was secured with 3, 0 Ethibond sutures.    Additional Marcaine was placed around the incision site for postoperative pain control.  A dressing was applied.  The patient was then awakened from anesthesia and sent to the postanesthesia care unit in stable condition.  All counts were correct per operating room staff.  Vanita Panda, MD  Colorectal and General Surgery [facility]

## 2023-04-30 NOTE — Anesthesia Procedure Notes (Signed)
Procedure Name: MAC Date/Time: 04/30/2023 9:09 AM  Performed by: Bishop Limbo, CRNAPre-anesthesia Checklist: Patient identified, Emergency Drugs available, Suction available and Patient being monitored Patient Re-evaluated:Patient Re-evaluated prior to induction Preoxygenation: Pre-oxygenation with 100% oxygen Placement Confirmation: positive ETCO2 Dental Injury: Teeth and Oropharynx as per pre-operative assessment

## 2023-04-30 NOTE — Anesthesia Preprocedure Evaluation (Signed)
Anesthesia Evaluation  Patient identified by MRN, date of birth, ID band Patient awake    Reviewed: Allergy & Precautions, NPO status , Patient's Chart, lab work & pertinent test results  Airway Mallampati: II  TM Distance: >3 FB Neck ROM: Full    Dental no notable dental hx.    Pulmonary neg pulmonary ROS   Pulmonary exam normal breath sounds clear to auscultation       Cardiovascular negative cardio ROS Normal cardiovascular exam Rhythm:Regular Rate:Normal     Neuro/Psych   Anxiety Depression    negative neurological ROS  negative psych ROS   GI/Hepatic Neg liver ROS,GERD  ,,  Endo/Other  PCOS  Renal/GU negative Renal ROS  negative genitourinary   Musculoskeletal negative musculoskeletal ROS (+)    Abdominal  (+) + obese  Peds negative pediatric ROS (+)  Hematology negative hematology ROS (+)   Anesthesia Other Findings   Reproductive/Obstetrics negative OB ROS                             Anesthesia Physical Anesthesia Plan  ASA: 2  Anesthesia Plan: MAC   Post-op Pain Management: Tylenol PO (pre-op)* and Minimal or no pain anticipated   Induction:   PONV Risk Score and Plan: 2 and Propofol infusion, Treatment may vary due to age or medical condition and Ondansetron  Airway Management Planned: Simple Face Mask  Additional Equipment:   Intra-op Plan:   Post-operative Plan:   Informed Consent: I have reviewed the patients History and Physical, chart, labs and discussed the procedure including the risks, benefits and alternatives for the proposed anesthesia with the patient or authorized representative who has indicated his/her understanding and acceptance.     Dental advisory given  Plan Discussed with: CRNA and Surgeon  Anesthesia Plan Comments:         Anesthesia Quick Evaluation

## 2023-04-30 NOTE — H&P (Signed)
PROVIDER:  Elenora Gamma, MD   MRN: [id number] DOB: May 30, 1980   Subjective    Chief Complaint: NEW PROBLEM (Anal Fistula)       History of Present Illness: Jamie Nelson is a 42 y.o. 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  Intraoperatively it was noted to track cephalad and medially and seems to be consistent with Bartholin's gland cyst.  Pathology just showed inflammatory tissue.  She did have a preoperative MRI which showed no communication to the anus or rectum.  She then underwent an exam under anesthesia for a possible vaginal perineal fistula.  This was done in October 2023.  A fistula was noted and upon injection of methylene blue dye this appeared to track to the anterior distal rectum.  Fibrin glue was used on the fistula but this did not seal the tissue.  Recently during prep for a colonoscopy she noticed worsening pain in the area as well as drainage.  A repeat MRI was ordered.  This showed some postsurgical change as well as some stranding in the surgical bed and a persistent track extending from the skin surface to the right anterior aspect of the vagina.     Review of Systems: A complete review of systems was obtained from the patient.  I have reviewed this information and discussed as appropriate with the patient.  See HPI as well for other ROS.       Medical History:     Past Medical History:  Diagnosis Date   Anxiety     Glaucoma (increased eye pressure)        There is no problem list on file for this patient.          Past Surgical History:  Procedure Laterality Date   c-section x3       knee surgery       liver cyst removal surgery               Allergies  Allergen Reactions   Macrobid [Nitrofurantoin Monohyd/M-Cryst] Hives and Other (See Comments)      SOB   Sulfa (Sulfonamide Antibiotics)  Itching            Current Outpatient Medications on File Prior to Visit  Medication Sig Dispense Refill   buPROPion (WELLBUTRIN SR) 100 MG SR tablet         metFORMIN (GLUCOPHAGE-XR) 500 MG XR tablet Take 2 tablets by mouth 2 (two) times daily       sertraline (ZOLOFT) 100 MG tablet Take 100 mg by mouth once daily (Patient not taking: Reported on 01/27/2023)       spironolactone (ALDACTONE) 50 MG tablet TAKE 1 TABLET (50 MG TOTAL) BY MOUTH TWICE A DAY        No current facility-administered medications on file prior to visit.           Family History  Problem Relation Age of Onset   High blood pressure (Hypertension) Mother     Colon cancer Father        Social History       Tobacco Use  Smoking Status Never  Smokeless Tobacco Never      Social History        Socioeconomic History   Marital status: Married  Tobacco Use   Smoking status: Never   Smokeless  tobacco: Never  Vaping Use   Vaping status: Never Used  Substance and Sexual Activity   Alcohol use: Yes   Drug use: Never      Objective:      Vitals:   04/30/23 0642  BP: 135/87  Pulse: 69  Resp: 17  Temp: 97.9 F (36.6 C)  SpO2: 98%      Exam Gen: NAD CV: RRR Pulm: CTA Abd: soft   Rectal: External opening noted at right anterior perianal area     Labs, Imaging and Diagnostic Testing: MRI reviewed, Previous OP notes reviewed, Dr Derrill Memo note reviewed   Assessment and Plan:  Diagnoses and all orders for this visit:   Anal fistula       42 year old female with chronic perineal wound status post 2 surgeries.  On her last surgery she was noted to have a fistula tracking to the distal anal canal.  This appears to an anal rectal fistula.  I have suggested exam under anesthesia with evaluation of the fistula tract and fistulotomy versus seton depending on how deep the fistula extends.  We have discussed this in detail including indications and recurrence rates for both.  We discussed typical  postoperative pain and time off of work.  All questions were answered.   Vanita Panda, MD Colon and Rectal Surgery [facility]

## 2023-04-30 NOTE — Anesthesia Postprocedure Evaluation (Signed)
Anesthesia Post Note  Patient: Jamie Nelson  Procedure(s) Performed: SETON PLACEMENT (Rectum) RECTAL EXAM UNDER ANESTHESIA (Rectum) LIGATION OF ANAL FISTULA (Rectum)     Patient location during evaluation: PACU Anesthesia Type: MAC Level of consciousness: awake and alert Pain management: pain level controlled Vital Signs Assessment: post-procedure vital signs reviewed and stable Respiratory status: spontaneous breathing, nonlabored ventilation, respiratory function stable and patient connected to nasal cannula oxygen Cardiovascular status: stable and blood pressure returned to baseline Postop Assessment: no apparent nausea or vomiting Anesthetic complications: no  No notable events documented.  Last Vitals:  Vitals:   04/30/23 1025 04/30/23 1112  BP: 120/76 (!) 123/92  Pulse: 68 60  Resp: 12 18  Temp:    SpO2: 100% 100%    Last Pain:  Vitals:   04/30/23 1025  TempSrc:   PainSc: 0-No pain                 Kennieth Rad

## 2023-05-03 ENCOUNTER — Encounter (HOSPITAL_BASED_OUTPATIENT_CLINIC_OR_DEPARTMENT_OTHER): Payer: Self-pay | Admitting: General Surgery

## 2023-05-03 LAB — SURGICAL PATHOLOGY

## 2023-05-06 DIAGNOSIS — R109 Unspecified abdominal pain: Secondary | ICD-10-CM | POA: Diagnosis not present

## 2023-05-10 ENCOUNTER — Other Ambulatory Visit (HOSPITAL_COMMUNITY): Payer: Self-pay

## 2023-05-10 MED ORDER — CEPHALEXIN 500 MG PO CAPS
500.0000 mg | ORAL_CAPSULE | Freq: Four times a day (QID) | ORAL | 0 refills | Status: DC
  Filled 2023-05-10: qty 20, 5d supply, fill #0

## 2023-05-11 DIAGNOSIS — K603 Anal fistula, unspecified: Secondary | ICD-10-CM | POA: Diagnosis not present

## 2023-05-21 ENCOUNTER — Ambulatory Visit: Payer: 59

## 2023-06-07 ENCOUNTER — Other Ambulatory Visit: Payer: Self-pay | Admitting: Obstetrics & Gynecology

## 2023-06-07 DIAGNOSIS — N6322 Unspecified lump in the left breast, upper inner quadrant: Secondary | ICD-10-CM

## 2023-06-09 ENCOUNTER — Ambulatory Visit: Payer: 59

## 2023-06-30 ENCOUNTER — Ambulatory Visit
Admission: RE | Admit: 2023-06-30 | Discharge: 2023-06-30 | Disposition: A | Discharge status: Home / Self Care | Payer: 59 | Source: Ambulatory Visit | Attending: Obstetrics & Gynecology | Admitting: Obstetrics & Gynecology

## 2023-06-30 DIAGNOSIS — N6322 Unspecified lump in the left breast, upper inner quadrant: Secondary | ICD-10-CM

## 2023-06-30 DIAGNOSIS — N6459 Other signs and symptoms in breast: Secondary | ICD-10-CM | POA: Diagnosis not present

## 2023-07-13 ENCOUNTER — Other Ambulatory Visit (HOSPITAL_COMMUNITY): Payer: Self-pay

## 2023-07-13 DIAGNOSIS — Z01411 Encounter for gynecological examination (general) (routine) with abnormal findings: Secondary | ICD-10-CM | POA: Diagnosis not present

## 2023-07-13 DIAGNOSIS — Z1331 Encounter for screening for depression: Secondary | ICD-10-CM | POA: Diagnosis not present

## 2023-07-13 DIAGNOSIS — E282 Polycystic ovarian syndrome: Secondary | ICD-10-CM | POA: Diagnosis not present

## 2023-07-13 DIAGNOSIS — Z124 Encounter for screening for malignant neoplasm of cervix: Secondary | ICD-10-CM | POA: Diagnosis not present

## 2023-07-13 DIAGNOSIS — Z113 Encounter for screening for infections with a predominantly sexual mode of transmission: Secondary | ICD-10-CM | POA: Diagnosis not present

## 2023-07-13 DIAGNOSIS — Z6834 Body mass index (BMI) 34.0-34.9, adult: Secondary | ICD-10-CM | POA: Diagnosis not present

## 2023-07-13 DIAGNOSIS — Z01419 Encounter for gynecological examination (general) (routine) without abnormal findings: Secondary | ICD-10-CM | POA: Diagnosis not present

## 2023-07-13 MED ORDER — SPIRONOLACTONE 50 MG PO TABS
50.0000 mg | ORAL_TABLET | Freq: Two times a day (BID) | ORAL | 3 refills | Status: AC
  Filled 2023-07-13: qty 180, 90d supply, fill #0
  Filled 2024-02-10: qty 180, 90d supply, fill #1
  Filled 2024-06-30: qty 180, 90d supply, fill #0

## 2023-07-13 MED ORDER — METFORMIN HCL ER 500 MG PO TB24
500.0000 mg | ORAL_TABLET | Freq: Two times a day (BID) | ORAL | 3 refills | Status: DC
  Filled 2023-07-13: qty 180, 90d supply, fill #0
  Filled 2023-10-25: qty 180, 90d supply, fill #1
  Filled 2024-02-10: qty 180, 90d supply, fill #2
  Filled 2024-04-24: qty 180, 90d supply, fill #3

## 2023-07-13 MED ORDER — CONTRAVE 8-90 MG PO TB12
ORAL_TABLET | ORAL | 0 refills | Status: AC
  Filled 2023-07-13 – 2023-07-20 (×2): qty 120, 39d supply, fill #0
  Filled 2023-07-23: qty 78, 30d supply, fill #0

## 2023-07-19 LAB — HM PAP SMEAR: HPV, high-risk: NEGATIVE

## 2023-07-20 ENCOUNTER — Other Ambulatory Visit (HOSPITAL_COMMUNITY): Payer: Self-pay

## 2023-07-20 DIAGNOSIS — E782 Mixed hyperlipidemia: Secondary | ICD-10-CM | POA: Diagnosis not present

## 2023-07-20 DIAGNOSIS — E8889 Other specified metabolic disorders: Secondary | ICD-10-CM | POA: Diagnosis not present

## 2023-07-20 DIAGNOSIS — Z1331 Encounter for screening for depression: Secondary | ICD-10-CM | POA: Diagnosis not present

## 2023-07-20 DIAGNOSIS — K604 Rectal fistula, unspecified: Secondary | ICD-10-CM | POA: Diagnosis not present

## 2023-07-20 DIAGNOSIS — E559 Vitamin D deficiency, unspecified: Secondary | ICD-10-CM | POA: Diagnosis not present

## 2023-07-20 DIAGNOSIS — F419 Anxiety disorder, unspecified: Secondary | ICD-10-CM | POA: Diagnosis not present

## 2023-07-20 DIAGNOSIS — Z6833 Body mass index (BMI) 33.0-33.9, adult: Secondary | ICD-10-CM | POA: Diagnosis not present

## 2023-07-20 DIAGNOSIS — K219 Gastro-esophageal reflux disease without esophagitis: Secondary | ICD-10-CM | POA: Diagnosis not present

## 2023-07-20 DIAGNOSIS — E66811 Obesity, class 1: Secondary | ICD-10-CM | POA: Diagnosis not present

## 2023-07-20 DIAGNOSIS — E282 Polycystic ovarian syndrome: Secondary | ICD-10-CM | POA: Diagnosis not present

## 2023-07-23 ENCOUNTER — Other Ambulatory Visit (HOSPITAL_COMMUNITY): Payer: Self-pay

## 2023-07-27 ENCOUNTER — Other Ambulatory Visit: Payer: Self-pay | Admitting: General Surgery

## 2023-07-27 DIAGNOSIS — K603 Anal fistula, unspecified: Secondary | ICD-10-CM | POA: Diagnosis not present

## 2023-07-27 NOTE — H&P (Signed)
 PROVIDER:  Elenora Gamma, MD  MRN: [id number] DOB: 31-Aug-1980 DATE OF ENCOUNTER: 07/27/2023 Interval History:    Patient underwent excision of a chronic perineal cyst last year.  The lesion continued.  She then underwent an exam under anesthesia for a possible vaginal perineal fistula in October 2023.  A fistula was noted that appeared to track to the anterior distal rectum.  Fibrin glue was used on the fistula but this did not seal the tissue.  Then during prep for a colonoscopy she noticed worsening pain in the area as well as drainage.  A repeat MRI showed some some stranding in the surgical bed and a persistent track extending from the skin surface to the right anterior aspect of the vagina.  I recommended exam under anesthesia.  We were able to track her fistula to her proximal anterior midline anal canal.  A seton was placed.  Past Medical History:  Diagnosis Date   Anxiety    Borderline glaucoma    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   Depression    Family history of breast cancer    Family history of colon cancer    father  dx'd age 35   Family history of colonic polyps 07/27/2017   Family history of lung cancer    mat GM, smoker   Family history of pancreatic cancer    mat great Grandfather   GERD (gastroesophageal reflux disease)    on omeprazole   History of gestational hypertension    History of liver biopsy 1988   per pt in 1988  removal hepatic cyst , benign   History of migraine    during pregnancy hormone related   Hyperlipidemia, mixed    Lactose intolerance    NAFLD (nonalcoholic fatty liver disease)    PCOS (polycystic ovarian syndrome)    Seasonal allergic rhinitis    Vagino perineal fistula    Vitamin D deficiency    Wears contact lenses 04/01/2021   or contacts   Past Surgical History:  Procedure Laterality Date   CESAREAN SECTION  10/19/2008   @[facility]    CESAREAN SECTION  02/26/2012   Procedure: CESAREAN SECTION;  Surgeon:  Tresa Endo A. Ernestina Penna, MD;  Location: [facility];  Service: Obstetrics;  Laterality: N/A;  EDD: 02/27/12   CESAREAN SECTION WITH BILATERAL TUBAL LIGATION Bilateral 01/30/2014   Procedure: Repeat CESAREAN SECTION WITH TUBAL LIGATION;  Surgeon: Tresa Endo A. Ernestina Penna, MD;  Location: [facility];  Service: Obstetrics;  Laterality: Bilateral;  EDD: 02/03/14   COLONOSCOPY WITH ESOPHAGOGASTRODUODENOSCOPY (EGD)  09/24/2021   dr Marina Goodell   CYST EXCISION PERINEAL N/A 04/04/2021   Procedure: EXCISION OF PERINEAL CYST;  Surgeon: Berna Bue, MD;  Location: [facility];  Service: General;  Laterality: N/A;   FISTULOTOMY N/A 04/30/2023   Procedure: SETON PLACEMENT;  Surgeon: Romie Levee, MD;  Location: [facility];  Service: General;  Laterality: N/A;   FOOT GANGLION EXCISION Left 11/20/2021   @[facility]    KNEE ARTHROSCOPY Left 1998   LIGATION OF INTERNAL FISTULA TRACT N/A 04/30/2023   Procedure: LIGATION OF ANAL FISTULA;  Surgeon: Romie Levee, MD;  Location: [facility];  Service: General;  Laterality: N/A;   LIVER CYST REMOVAL  1988   benign   RECTAL EXAM UNDER ANESTHESIA N/A 04/30/2023   Procedure: RECTAL EXAM UNDER ANESTHESIA;  Surgeon: Romie Levee, MD;  Location: [facility];  Service: General;  Laterality: N/A;   SCAR REVISION N/A 01/30/2014  Procedure: SCAR REVISION;  Surgeon: Tresa Endo A. Ernestina Penna, MD;  Location: [facility];  Service: Obstetrics;  Laterality: N/A;   trabiculectomy Bilateral    for "pigment dispursion syndrome" per pt, 2021   VESICO-VAGINAL FISTULA REPAIR N/A 03/05/2022   Procedure: PERINEAL FISTULA REPAIR WITH FIBRIN GLUE;  Surgeon: Marguerita Beards, MD;  Location: [facility];  Service: Gynecology;  Laterality: N/A;   WISDOM TOOTH EXTRACTION  2004   Family History  Problem Relation Age of Onset   Hypertension Mother    Depression Mother    Colon polyps Mother        goes every 2 years for c-scope   Colon cancer  Father 59       dx at stage IV   High Cholesterol Father    Alcoholism Father    Lung cancer Maternal Grandmother    Colon cancer Maternal Grandfather 37   Stroke Paternal Grandmother    Diabetes Paternal Grandmother    Stroke Paternal Grandfather    Breast cancer Maternal Aunt 60       took HRT   Colon cancer Maternal Uncle 58   Colon cancer Paternal Uncle        dx late 60's   Colon cancer Paternal Uncle        dx late 60's   Colon polyps Paternal Uncle        precancerous   Pancreatic cancer Other 72   Esophageal cancer Neg Hx    Stomach cancer Neg Hx    Rectal cancer Neg Hx    Social History   Socioeconomic History   Marital status: Married    Spouse name: [name]   Number of children: Not on file   Years of education: Not on file   Highest education level: Not on file  Occupational History   Not on file  Tobacco Use   Smoking status: Never   Smokeless tobacco: Never   Tobacco comments:    Married, lives with spouse (robert who is Artist of hospitalist service) and son Jamie Nelson who has down symdrome  Vaping Use   Vaping status: Never Used  Substance and Sexual Activity   Alcohol use: Yes    Comment: socially   Drug use: Never   Sexual activity: Yes    Birth control/protection: Surgical  Other Topics Concern   Not on file  Social History Narrative   Not on file   Social Drivers of Health   Financial Resource Strain: Not on file  Food Insecurity: Not on file  Transportation Needs: Not on file  Physical Activity: Not on file  Stress: Not on file  Social Connections: Not on file  Intimate Partner Violence: Not on file     Current Outpatient Medications:    cephALEXin (KEFLEX) 500 MG capsule, Take 1 capsule (500 mg total) by mouth every 6 (six) hours for 5 days, Disp: 20 capsule, Rfl: 0   cetirizine (ZYRTEC) 10 MG tablet, Take 10 mg by mouth daily., Disp: , Rfl:    ibuprofen (ADVIL) 400 MG tablet, Take 400 mg by mouth every 6 (six) hours as  needed., Disp: , Rfl:    metFORMIN (GLUCOPHAGE-XR) 500 MG 24 hr tablet, Take 1 tablet (500 mg total) by mouth 2 (two) times daily., Disp: 180 tablet, Rfl: 3   Naltrexone-buPROPion HCl ER (CONTRAVE) 8-90 MG TB12, Take 1 tablet by mouth in the morning for 7 days, THEN 1 tablet 2 (two) times daily for 7 days, THEN 2 tablets every  morning and 1 tablet every evening for 7 days, THEN 2 tablets 2 (two) times daily thereafter., Disp: 120 tablet, Rfl: 0   omeprazole (PRILOSEC) 20 MG capsule, Take 1 capsule (20 mg total) by mouth daily., Disp: 90 capsule, Rfl: 0   spironolactone (ALDACTONE) 50 MG tablet, Take 1 tablet (50 mg total) by mouth 2 (two) times daily., Disp: 180 tablet, Rfl: 3   spironolactone (ALDACTONE) 50 MG tablet, Take 1 tablet (50 mg total) by mouth 2 (two) times daily., Disp: 180 tablet, Rfl: 3   traMADol (ULTRAM) 50 MG tablet, Take 1-2 tablets (50-100 mg total) by mouth every 6 (six) hours as needed., Disp: 30 tablet, Rfl: 0   tretinoin (RETIN-A) 0.025 % cream, Apply pea size amount to affected regions 2 to 3 times a week building up to nightly as tolerated. (Patient taking differently: Apply topically at bedtime.), Disp: 20 g, Rfl: 11   Vitamin D, Ergocalciferol, (DRISDOL) 1.25 MG (50000 UNIT) CAPS capsule, Take 1 capsule (50,000 Units total) by mouth every 7 (seven) days., Disp: 12 capsule, Rfl: 0  Allergies  Allergen Reactions   Nitrofurantoin Monohyd Macro Anaphylaxis, Hives, Itching and Swelling   Sulfa Antibiotics Anaphylaxis, Hives, Itching and Swelling   Milk-Related Compounds Other (See Comments)    Upset stomach    Review of Systems - Negative except as stated above   Physical Examination:   Gen: NAD Incision: healing well, seton in place  Assessment and Plan:   Jamie Nelson is a 43 y.o. female who underwent partial fistulotomy and seton placement for complex anorectal fistula.  Diagnoses and all orders for this visit:  Anal fistula    She has healed well and is  ready for a secondary procedure.  I have recommended ligation of internal fistula tract.  We discussed this in detail today including recurrence rates and risk of incontinence.  All questions were answered.  Patient would like to proceed.    The plan was discussed in detail with the patient today, who expressed understanding.  The patient has my contact information, and understands to call me with any additional questions or concerns in the interval.  I would be happy to see the patient back sooner if the need arises.   Vanita Panda, MD Colon and Rectal Surgery [facility]

## 2023-07-28 ENCOUNTER — Ambulatory Visit: Payer: 59 | Admitting: Family Medicine

## 2023-08-04 DIAGNOSIS — Z6834 Body mass index (BMI) 34.0-34.9, adult: Secondary | ICD-10-CM | POA: Diagnosis not present

## 2023-08-04 DIAGNOSIS — E782 Mixed hyperlipidemia: Secondary | ICD-10-CM | POA: Diagnosis not present

## 2023-08-04 DIAGNOSIS — E66811 Obesity, class 1: Secondary | ICD-10-CM | POA: Diagnosis not present

## 2023-08-04 DIAGNOSIS — E559 Vitamin D deficiency, unspecified: Secondary | ICD-10-CM | POA: Diagnosis not present

## 2023-08-04 DIAGNOSIS — E88819 Insulin resistance, unspecified: Secondary | ICD-10-CM | POA: Diagnosis not present

## 2023-08-04 DIAGNOSIS — E6609 Other obesity due to excess calories: Secondary | ICD-10-CM | POA: Diagnosis not present

## 2023-08-16 DIAGNOSIS — M19072 Primary osteoarthritis, left ankle and foot: Secondary | ICD-10-CM | POA: Diagnosis not present

## 2023-08-16 DIAGNOSIS — M79675 Pain in left toe(s): Secondary | ICD-10-CM | POA: Diagnosis not present

## 2023-08-25 DIAGNOSIS — E782 Mixed hyperlipidemia: Secondary | ICD-10-CM | POA: Diagnosis not present

## 2023-08-25 DIAGNOSIS — Z6834 Body mass index (BMI) 34.0-34.9, adult: Secondary | ICD-10-CM | POA: Diagnosis not present

## 2023-08-25 DIAGNOSIS — E559 Vitamin D deficiency, unspecified: Secondary | ICD-10-CM | POA: Diagnosis not present

## 2023-08-25 DIAGNOSIS — E66811 Obesity, class 1: Secondary | ICD-10-CM | POA: Diagnosis not present

## 2023-08-25 DIAGNOSIS — E88819 Insulin resistance, unspecified: Secondary | ICD-10-CM | POA: Diagnosis not present

## 2023-09-08 ENCOUNTER — Encounter (HOSPITAL_BASED_OUTPATIENT_CLINIC_OR_DEPARTMENT_OTHER): Payer: Self-pay | Admitting: General Surgery

## 2023-09-08 ENCOUNTER — Other Ambulatory Visit: Payer: Self-pay

## 2023-09-08 NOTE — Progress Notes (Signed)
   09/08/23 1146  PAT Phone Screen  Is the patient taking a GLP-1 receptor agonist? No  Do You Have Diabetes? No  Do You Have Hypertension? No  Have You Ever Been to the ER for Asthma? No  Have You Taken Oral Steroids in the Past 3 Months? No  Do you Take Phenteramine or any Other Diet Drugs? Yes (recently started on Qsymia- will hold until after surgery)  Recent  Lab Work, EKG, CXR? No  Do you have a history of heart problems? No  Any Recent Hospitalizations? No  Height 5\' 5"  (1.651 m)  Weight 93.4 kg  Pat Appointment Scheduled Yes (Bmet (diuretic))

## 2023-09-13 ENCOUNTER — Other Ambulatory Visit (HOSPITAL_COMMUNITY): Payer: Self-pay

## 2023-09-14 ENCOUNTER — Encounter (HOSPITAL_BASED_OUTPATIENT_CLINIC_OR_DEPARTMENT_OTHER)
Admission: RE | Admit: 2023-09-14 | Discharge: 2023-09-14 | Disposition: A | Discharge status: Home / Self Care | Source: Ambulatory Visit | Attending: General Surgery | Admitting: General Surgery

## 2023-09-14 DIAGNOSIS — Z01812 Encounter for preprocedural laboratory examination: Secondary | ICD-10-CM | POA: Insufficient documentation

## 2023-09-14 DIAGNOSIS — Z79899 Other long term (current) drug therapy: Secondary | ICD-10-CM | POA: Insufficient documentation

## 2023-09-14 LAB — BASIC METABOLIC PANEL WITH GFR
Anion gap: 11 (ref 5–15)
BUN: 9 mg/dL (ref 6–20)
CO2: 22 mmol/L (ref 22–32)
Calcium: 9.3 mg/dL (ref 8.9–10.3)
Chloride: 104 mmol/L (ref 98–111)
Creatinine, Ser: 0.91 mg/dL (ref 0.44–1.00)
GFR, Estimated: >60 mL/min (ref >60)
Glucose, Bld: 82 mg/dL (ref 70–99)
Potassium: 4.7 mmol/L (ref 3.5–5.1)
Sodium: 137 mmol/L (ref 135–145)

## 2023-09-15 NOTE — H&P (Incomplete)
 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  The lesion continued.  She then underwent an exam under anesthesia for a possible vaginal perineal fistula in October 2023.  A fistula was noted that appeared to track to the anterior distal rectum.  Fibrin glue was used on the fistula but this did not seal the tissue.  Then during prep for a colonoscopy she noticed worsening pain in the area as well as drainage.  A repeat MRI showed some some stranding in the surgical bed and a persistent track extending from the skin surface to the right anterior aspect of the vagina.  I recommended exam under anesthesia.  We were able to track her fistula to her proximal anterior midline anal canal.  A seton was placed.       Past Medical History:  Diagnosis Date   Anxiety     Borderline glaucoma      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   Depression     Family history of breast cancer     Family history of colon cancer      father  dx'd age 28   Family history of colonic polyps 07/27/2017   Family history of lung cancer      mat GM, smoker   Family history of pancreatic cancer      mat great Grandfather   GERD (gastroesophageal reflux disease)      on omeprazole    History of gestational hypertension     History of liver biopsy 1988    per pt in 1988  removal hepatic cyst , benign   History of migraine      during pregnancy hormone related   Hyperlipidemia, mixed     Lactose intolerance     NAFLD (nonalcoholic fatty liver disease)     PCOS (polycystic ovarian syndrome)     Seasonal allergic rhinitis     Vagino perineal fistula     Vitamin D  deficiency     Wears contact lenses 04/01/2021    or contacts             Past Surgical History:  Procedure Laterality Date   CESAREAN SECTION   10/19/2008    @[facility]    CESAREAN SECTION    02/26/2012    Procedure: CESAREAN SECTION;  Surgeon: Jamie Ringer A. Johnston Nao, MD;  Location: [facility];  Service: Obstetrics;  Laterality: N/A;  EDD: 02/27/12   CESAREAN SECTION WITH BILATERAL TUBAL LIGATION Bilateral 01/30/2014    Procedure: Repeat CESAREAN SECTION WITH TUBAL LIGATION;  Surgeon: Jamie Ringer A. Johnston Nao, MD;  Location: [facility];  Service: Obstetrics;  Laterality: Bilateral;  EDD: 02/03/14   COLONOSCOPY WITH ESOPHAGOGASTRODUODENOSCOPY (EGD)   09/24/2021    dr Jamie Nelson   CYST EXCISION PERINEAL N/A 04/04/2021    Procedure: EXCISION OF PERINEAL CYST;  Surgeon: Jamie Acton, MD;  Location: [facility];  Service: General;  Laterality: N/A;   FISTULOTOMY N/A 04/30/2023    Procedure: SETON PLACEMENT;  Surgeon: Jamie Nixon, MD;  Location: [facility];  Service: General;  Laterality: N/A;   FOOT GANGLION EXCISION Left 11/20/2021    @[facility]    KNEE ARTHROSCOPY Left 1998   LIGATION OF INTERNAL FISTULA TRACT N/A 04/30/2023    Procedure: LIGATION OF ANAL FISTULA;  Surgeon: Jamie Nixon, MD;  Location: [facility];  Service:  General;  Laterality: N/A;   LIVER CYST REMOVAL   1988    benign   RECTAL EXAM UNDER ANESTHESIA N/A 04/30/2023    Procedure: RECTAL EXAM UNDER ANESTHESIA;  Surgeon: Jamie Nixon, MD;  Location: [facility];  Service: General;  Laterality: N/A;   SCAR REVISION N/A 01/30/2014    Procedure: SCAR REVISION;  Surgeon: Marshel Skeeters. Johnston Nao, MD;  Location: [facility];  Service: Obstetrics;  Laterality: N/A;   trabiculectomy Bilateral      for "pigment dispursion syndrome" per pt, 2021   VESICO-VAGINAL FISTULA REPAIR N/A 03/05/2022    Procedure: PERINEAL FISTULA REPAIR WITH FIBRIN GLUE;  Surgeon: Arma Lamp, MD;  Location: [facility];  Service: Gynecology;  Laterality: N/A;   WISDOM TOOTH EXTRACTION   2004             Family History  Problem Relation Age of Onset   Hypertension Mother     Depression Mother      Colon polyps Mother          goes every 2 years for c-scope   Colon cancer Father 67        dx at stage IV   High Cholesterol Father     Alcoholism Father     Lung cancer Maternal Grandmother     Colon cancer Maternal Grandfather 23   Stroke Paternal Grandmother     Diabetes Paternal Grandmother     Stroke Paternal Grandfather     Breast cancer Maternal Aunt 60        took HRT   Colon cancer Maternal Uncle 31   Colon cancer Paternal Uncle          dx late 60's   Colon cancer Paternal Uncle          dx late 60's   Colon polyps Paternal Uncle          precancerous   Pancreatic cancer Other 25   Esophageal cancer Neg Hx     Stomach cancer Neg Hx     Rectal cancer Neg Hx          Social History         Socioeconomic History   Marital status: Married      Spouse name: Porfirio Nelson   Number of children: Not on file   Years of education: Not on file   Highest education level: Not on file  Occupational History   Not on file  Tobacco Use   Smoking status: Never   Smokeless tobacco: Never   Tobacco comments:      Married, lives with spouse (Jamie who is Artist of hospitalist service) and son Jamie Nelson who has down symdrome  Vaping Use   Vaping status: Never Used  Substance and Sexual Activity   Alcohol use: Yes      Comment: socially   Drug use: Never   Sexual activity: Yes      Birth control/protection: Surgical  Other Topics Concern   Not on file  Social History Narrative   Not on file    Social Drivers of Health    Financial Resource Strain: Not on file  Food Insecurity: Not on file  Transportation Needs: Not on file  Physical Activity: Not on file  Stress: Not on file  Social Connections: Not on file  Intimate Partner Violence: Not on file       Current Medications    Current Outpatient Medications:    cephALEXin  (KEFLEX ) 500 MG  capsule, Take 1 capsule (500 mg total) by mouth every 6 (six) hours for 5 days, Disp: 20 capsule, Rfl: 0   cetirizine  (ZYRTEC) 10 MG tablet, Take 10 mg by mouth daily., Disp: , Rfl:    ibuprofen  (ADVIL ) 400 MG tablet, Take 400 mg by mouth every 6 (six) hours as needed., Disp: , Rfl:    metFORMIN  (GLUCOPHAGE -XR) 500 MG 24 hr tablet, Take 1 tablet (500 mg total) by mouth 2 (two) times daily., Disp: 180 tablet, Rfl: 3   Naltrexone -buPROPion  HCl ER (CONTRAVE ) 8-90 MG TB12, Take 1 tablet by mouth in the morning for 7 days, THEN 1 tablet 2 (two) times daily for 7 days, THEN 2 tablets every morning and 1 tablet every evening for 7 days, THEN 2 tablets 2 (two) times daily thereafter., Disp: 120 tablet, Rfl: 0   omeprazole  (PRILOSEC) 20 MG capsule, Take 1 capsule (20 mg total) by mouth daily., Disp: 90 capsule, Rfl: 0   spironolactone  (ALDACTONE ) 50 MG tablet, Take 1 tablet (50 mg total) by mouth 2 (two) times daily., Disp: 180 tablet, Rfl: 3   spironolactone  (ALDACTONE ) 50 MG tablet, Take 1 tablet (50 mg total) by mouth 2 (two) times daily., Disp: 180 tablet, Rfl: 3   traMADol  (ULTRAM ) 50 MG tablet, Take 1-2 tablets (50-100 mg total) by mouth every 6 (six) hours as needed., Disp: 30 tablet, Rfl: 0   tretinoin  (RETIN-A ) 0.025 % cream, Apply pea size amount to affected regions 2 to 3 times a week building up to nightly as tolerated. (Patient taking differently: Apply topically at bedtime.), Disp: 20 g, Rfl: 11   Vitamin D , Ergocalciferol , (DRISDOL ) 1.25 MG (50000 UNIT) CAPS capsule, Take 1 capsule (50,000 Units total) by mouth every 7 (seven) days., Disp: 12 capsule, Rfl: 0     Allergies       Allergies  Allergen Reactions   Nitrofurantoin  Monohyd Macro Anaphylaxis, Hives, Itching and Swelling   Sulfa  Antibiotics Anaphylaxis, Hives, Itching and Swelling   Milk-Related Compounds Other (See Comments)      Upset stomach       Review of Systems - Negative except as stated above   Physical Examination:    Gen: NAD Incision: healing well, seton in place   Assessment and Plan:    Jamie Nelson is a 43 y.o. female  who underwent partial fistulotomy and seton placement for complex anorectal fistula.   Diagnoses and all orders for this visit:   Anal fistula     She has healed well and is ready for a secondary procedure.  I have recommended ligation of internal fistula tract.  We discussed this in detail again today, including recurrence rates and small risk of incontinence.  All questions were answered.  Patient would like to proceed.       The plan was discussed in detail with the patient today, who expressed understanding.  The patient has my contact information, and understands to call me with any additional questions or concerns in the interval.  I would be happy to see the patient back sooner if the need arises.    Fernande Howells, MD Colon and Rectal Surgery [facility]

## 2023-09-16 ENCOUNTER — Other Ambulatory Visit: Payer: Self-pay

## 2023-09-16 ENCOUNTER — Other Ambulatory Visit (HOSPITAL_COMMUNITY): Payer: Self-pay

## 2023-09-16 ENCOUNTER — Encounter (HOSPITAL_BASED_OUTPATIENT_CLINIC_OR_DEPARTMENT_OTHER): Payer: Self-pay | Admitting: General Surgery

## 2023-09-16 ENCOUNTER — Ambulatory Visit (HOSPITAL_BASED_OUTPATIENT_CLINIC_OR_DEPARTMENT_OTHER): Payer: Self-pay | Admitting: Anesthesiology

## 2023-09-16 ENCOUNTER — Encounter (HOSPITAL_BASED_OUTPATIENT_CLINIC_OR_DEPARTMENT_OTHER)
Admission: RE | Disposition: A | Discharge status: Home / Self Care | Payer: Self-pay | Source: Home / Self Care | Attending: General Surgery

## 2023-09-16 ENCOUNTER — Ambulatory Visit (HOSPITAL_BASED_OUTPATIENT_CLINIC_OR_DEPARTMENT_OTHER)
Admission: RE | Admit: 2023-09-16 | Discharge: 2023-09-16 | Disposition: A | Discharge status: Home / Self Care | Attending: General Surgery | Admitting: General Surgery

## 2023-09-16 DIAGNOSIS — K219 Gastro-esophageal reflux disease without esophagitis: Secondary | ICD-10-CM | POA: Insufficient documentation

## 2023-09-16 DIAGNOSIS — K60313 Anal fistula, simple, recurrent: Secondary | ICD-10-CM | POA: Diagnosis not present

## 2023-09-16 DIAGNOSIS — E119 Type 2 diabetes mellitus without complications: Secondary | ICD-10-CM | POA: Insufficient documentation

## 2023-09-16 DIAGNOSIS — K60321 Anal fistula, complex, initial: Secondary | ICD-10-CM | POA: Diagnosis not present

## 2023-09-16 DIAGNOSIS — K603 Anal fistula, unspecified: Secondary | ICD-10-CM | POA: Insufficient documentation

## 2023-09-16 DIAGNOSIS — Z7984 Long term (current) use of oral hypoglycemic drugs: Secondary | ICD-10-CM | POA: Insufficient documentation

## 2023-09-16 DIAGNOSIS — Z01818 Encounter for other preprocedural examination: Secondary | ICD-10-CM

## 2023-09-16 DIAGNOSIS — Z79899 Other long term (current) drug therapy: Secondary | ICD-10-CM

## 2023-09-16 HISTORY — PX: LIGATION OF INTERNAL FISTULA TRACT: SHX6551

## 2023-09-16 LAB — POCT PREGNANCY, URINE: Preg Test, Ur: NEGATIVE

## 2023-09-16 SURGERY — LIGATION, INTERNAL FISTULA TRACT
Anesthesia: General | Site: Rectum

## 2023-09-16 MED ORDER — OXYCODONE HCL 5 MG/5ML PO SOLN: 5.0000 mg | Freq: Once | ORAL | Status: DC | PRN

## 2023-09-16 MED ORDER — MIDAZOLAM HCL 2 MG/2ML IJ SOLN
INTRAMUSCULAR | Status: DC | PRN
  Administered 2023-09-16: 2 mg via INTRAVENOUS

## 2023-09-16 MED ORDER — GABAPENTIN 300 MG PO CAPS
300.0000 mg | ORAL_CAPSULE | ORAL | Status: AC
  Administered 2023-09-16: 300 mg via ORAL

## 2023-09-16 MED ORDER — BUPIVACAINE-EPINEPHRINE (PF) 0.5% -1:200000 IJ SOLN
INTRAMUSCULAR | Status: AC
  Filled 2023-09-16: qty 90

## 2023-09-16 MED ORDER — FENTANYL CITRATE (PF) 100 MCG/2ML IJ SOLN
INTRAMUSCULAR | Status: DC | PRN
Start: 2023-09-16 — End: 2023-09-16
  Administered 2023-09-16: 50 ug via INTRAVENOUS

## 2023-09-16 MED ORDER — DEXAMETHASONE SODIUM PHOSPHATE 10 MG/ML IJ SOLN
INTRAMUSCULAR | Status: DC | PRN
  Administered 2023-09-16: 10 mg via INTRAVENOUS

## 2023-09-16 MED ORDER — PROPOFOL 10 MG/ML IV BOLUS
INTRAVENOUS | Status: DC | PRN
  Administered 2023-09-16: 180 mg via INTRAVENOUS

## 2023-09-16 MED ORDER — 0.9 % SODIUM CHLORIDE (POUR BTL) OPTIME
TOPICAL | Status: DC | PRN
  Administered 2023-09-16: 1000 mL

## 2023-09-16 MED ORDER — PROPOFOL 1000 MG/100ML IV EMUL
INTRAVENOUS | Status: AC
  Filled 2023-09-16: qty 600

## 2023-09-16 MED ORDER — PROPOFOL 500 MG/50ML IV EMUL
INTRAVENOUS | Status: DC | PRN
  Administered 2023-09-16: 200 ug/kg/min via INTRAVENOUS

## 2023-09-16 MED ORDER — BUPIVACAINE LIPOSOME 1.3 % IJ SUSP
INTRAMUSCULAR | Status: AC
  Filled 2023-09-16: qty 40

## 2023-09-16 MED ORDER — ROCURONIUM BROMIDE 10 MG/ML (PF) SYRINGE
PREFILLED_SYRINGE | INTRAVENOUS | Status: AC
  Filled 2023-09-16: qty 10

## 2023-09-16 MED ORDER — DEXMEDETOMIDINE HCL IN NACL 80 MCG/20ML IV SOLN
INTRAVENOUS | Status: AC
  Filled 2023-09-16: qty 20

## 2023-09-16 MED ORDER — FENTANYL CITRATE (PF) 100 MCG/2ML IJ SOLN: 25.0000 ug | INTRAMUSCULAR | Status: DC | PRN

## 2023-09-16 MED ORDER — PROPOFOL 500 MG/50ML IV EMUL
INTRAVENOUS | Status: AC
  Filled 2023-09-16: qty 150

## 2023-09-16 MED ORDER — OXYCODONE HCL 5 MG PO TABS: 5.0000 mg | ORAL_TABLET | Freq: Once | ORAL | Status: DC | PRN

## 2023-09-16 MED ORDER — SODIUM CHLORIDE 0.9% FLUSH: 3.0000 mL | Freq: Two times a day (BID) | INTRAVENOUS | Status: DC

## 2023-09-16 MED ORDER — GABAPENTIN 300 MG PO CAPS
ORAL_CAPSULE | ORAL | Status: AC
  Filled 2023-09-16: qty 1

## 2023-09-16 MED ORDER — MIDAZOLAM HCL 2 MG/2ML IJ SOLN
INTRAMUSCULAR | Status: AC
  Filled 2023-09-16: qty 2

## 2023-09-16 MED ORDER — FENTANYL CITRATE (PF) 100 MCG/2ML IJ SOLN
INTRAMUSCULAR | Status: AC
  Filled 2023-09-16: qty 2

## 2023-09-16 MED ORDER — CELECOXIB 200 MG PO CAPS
200.0000 mg | ORAL_CAPSULE | ORAL | Status: AC
  Administered 2023-09-16: 200 mg via ORAL

## 2023-09-16 MED ORDER — DEXMEDETOMIDINE HCL IN NACL 80 MCG/20ML IV SOLN
INTRAVENOUS | Status: DC | PRN
  Administered 2023-09-16: 4 ug via INTRAVENOUS

## 2023-09-16 MED ORDER — BUPIVACAINE-EPINEPHRINE (PF) 0.5% -1:200000 IJ SOLN
INTRAMUSCULAR | Status: DC | PRN
  Administered 2023-09-16: 50 mL

## 2023-09-16 MED ORDER — ACETAMINOPHEN 500 MG PO TABS
ORAL_TABLET | ORAL | Status: AC
  Filled 2023-09-16: qty 2

## 2023-09-16 MED ORDER — OXYCODONE HCL 5 MG PO TABS
5.0000 mg | ORAL_TABLET | Freq: Four times a day (QID) | ORAL | 0 refills | Status: DC | PRN
  Filled 2023-09-16: qty 30, 4d supply, fill #0

## 2023-09-16 MED ORDER — SUGAMMADEX SODIUM 200 MG/2ML IV SOLN
INTRAVENOUS | Status: DC | PRN
  Administered 2023-09-16: 200 mg via INTRAVENOUS

## 2023-09-16 MED ORDER — ACETAMINOPHEN 10 MG/ML IV SOLN: 1000.0000 mg | Freq: Once | INTRAVENOUS | Status: DC | PRN

## 2023-09-16 MED ORDER — DROPERIDOL 2.5 MG/ML IJ SOLN: 0.6250 mg | Freq: Once | INTRAMUSCULAR | Status: DC | PRN

## 2023-09-16 MED ORDER — ACETAMINOPHEN 500 MG PO TABS
1000.0000 mg | ORAL_TABLET | ORAL | Status: AC
  Administered 2023-09-16: 1000 mg via ORAL

## 2023-09-16 MED ORDER — CELECOXIB 200 MG PO CAPS
ORAL_CAPSULE | ORAL | Status: AC
  Filled 2023-09-16: qty 1

## 2023-09-16 MED ORDER — LACTATED RINGERS IV SOLN: INTRAVENOUS | Status: DC

## 2023-09-16 MED ORDER — LIDOCAINE HCL (CARDIAC) PF 100 MG/5ML IV SOSY
PREFILLED_SYRINGE | INTRAVENOUS | Status: DC | PRN
  Administered 2023-09-16: 40 mg via INTRAVENOUS

## 2023-09-16 MED ORDER — ONDANSETRON HCL 4 MG/2ML IJ SOLN
INTRAMUSCULAR | Status: DC | PRN
  Administered 2023-09-16: 4 mg via INTRAVENOUS

## 2023-09-16 MED ORDER — BUPIVACAINE LIPOSOME 1.3 % IJ SUSP
20.0000 mL | Freq: Once | INTRAMUSCULAR | Status: DC
Start: 2023-09-16 — End: 2023-09-16

## 2023-09-16 SURGICAL SUPPLY — 47 items
BENZOIN TINCTURE PRP APPL 2/3 (GAUZE/BANDAGES/DRESSINGS) ×4 IMPLANT
BRIEF MESH DISP 2XL (UNDERPADS AND DIAPERS) ×2 IMPLANT
COVER BACK TABLE 60X90IN (DRAPES) ×2 IMPLANT
COVER MAYO STAND STRL (DRAPES) ×2 IMPLANT
DRAPE HYSTEROSCOPY (MISCELLANEOUS) IMPLANT
DRAPE LAPAROTOMY 100X72 PEDS (DRAPES) ×2 IMPLANT
DRAPE UTILITY XL STRL (DRAPES) ×2 IMPLANT
ELECTRODE REM PT RTRN 9FT ADLT (ELECTROSURGICAL) ×2 IMPLANT
GAUZE 4X4 16PLY ~~LOC~~+RFID DBL (SPONGE) ×2 IMPLANT
GAUZE PAD ABD 8X10 STRL (GAUZE/BANDAGES/DRESSINGS) ×2 IMPLANT
GAUZE SPONGE 4X4 12PLY STRL (GAUZE/BANDAGES/DRESSINGS) ×1 IMPLANT
GLOVE BIO SURGEON STRL SZ 6.5 (GLOVE) ×2 IMPLANT
GLOVE BIOGEL PI IND STRL 7.0 (GLOVE) ×1 IMPLANT
GLOVE INDICATOR 6.5 STRL GRN (GLOVE) ×2 IMPLANT
GLOVE SURG SS PI 6.5 STRL IVOR (GLOVE) ×1 IMPLANT
GOWN STRL REUS W/ TWL LRG LVL3 (GOWN DISPOSABLE) ×2 IMPLANT
GOWN STRL REUS W/TWL XL LVL3 (GOWN DISPOSABLE) ×2 IMPLANT
HYDROGEN PEROXIDE 16OZ (MISCELLANEOUS) ×1 IMPLANT
IV CATH 14GX2 1/4 (CATHETERS) ×1 IMPLANT
KIT TURNOVER KIT B (KITS) ×2 IMPLANT
LEGGING LITHOTOMY PAIR STRL (DRAPES) IMPLANT
NDL HYPO 22X1.5 SAFETY MO (MISCELLANEOUS) ×1 IMPLANT
NEEDLE HYPO 22X1.5 SAFETY MO (MISCELLANEOUS) ×2 IMPLANT
NS IRRIG 1000ML POUR BTL (IV SOLUTION) ×2 IMPLANT
PACK BASIN DAY SURGERY FS (CUSTOM PROCEDURE TRAY) ×2 IMPLANT
PAD ARMBOARD POSITIONER FOAM (MISCELLANEOUS) IMPLANT
PENCIL SMOKE EVACUATOR (MISCELLANEOUS) ×2 IMPLANT
RETRACTOR WILSON SYSTEM (INSTRUMENTS) ×1 IMPLANT
SHEET MEDIUM DRAPE 40X70 STRL (DRAPES) IMPLANT
SLEEVE SCD COMPRESS KNEE MED (STOCKING) ×2 IMPLANT
SPIKE FLUID TRANSFER (MISCELLANEOUS) ×2 IMPLANT
SPONGE HEMORRHOID 8X3CM (HEMOSTASIS) IMPLANT
SPONGE SURGIFOAM ABS GEL 12-7 (HEMOSTASIS) IMPLANT
SUCTION TUBE FRAZIER 10FR DISP (SUCTIONS) IMPLANT
SUT CHROMIC 2 0 SH (SUTURE) ×1 IMPLANT
SUT CHROMIC 3 0 SH 27 (SUTURE) IMPLANT
SUT ETHIBOND 0 (SUTURE) IMPLANT
SUT SILK 2 0 SH (SUTURE) ×3 IMPLANT
SUT SILK 3 0 RB1 (SUTURE) ×1 IMPLANT
SUT VIC AB 2-0 SH 27XBRD (SUTURE) IMPLANT
SUT VIC AB 3-0 SH 18 (SUTURE) ×1 IMPLANT
SUT VIC AB 3-0 SH 27XBRD (SUTURE) ×1 IMPLANT
SYR CONTROL 10ML LL (SYRINGE) ×3 IMPLANT
TOWEL GREEN STERILE FF (TOWEL DISPOSABLE) ×2 IMPLANT
TRAY DSU PREP LF (CUSTOM PROCEDURE TRAY) ×2 IMPLANT
TUBE CONNECTING 20X1/4 (TUBING) ×2 IMPLANT
YANKAUER SUCT BULB TIP NO VENT (SUCTIONS) ×2 IMPLANT

## 2023-09-16 NOTE — Anesthesia Preprocedure Evaluation (Addendum)
 Anesthesia Evaluation  Patient identified by MRN, date of birth, ID band Patient awake    Reviewed: Allergy & Precautions, NPO status , Patient's Chart, lab work & pertinent test results  Airway Mallampati: I  TM Distance: >3 FB Neck ROM: Full    Dental  (+) Teeth Intact, Dental Advisory Given   Pulmonary neg pulmonary ROS   breath sounds clear to auscultation       Cardiovascular negative cardio ROS  Rhythm:Regular Rate:Normal     Neuro/Psych  PSYCHIATRIC DISORDERS Anxiety Depression    negative neurological ROS     GI/Hepatic Neg liver ROS,GERD  Medicated,,  Endo/Other  diabetes, Type 2, Oral Hypoglycemic Agents    Renal/GU negative Renal ROS     Musculoskeletal negative musculoskeletal ROS (+)    Abdominal   Peds  Hematology negative hematology ROS (+)   Anesthesia Other Findings   Reproductive/Obstetrics                             Anesthesia Physical Anesthesia Plan  ASA: 2  Anesthesia Plan: General   Post-op Pain Management: Toradol  IV (intra-op)*, Celebrex  PO (pre-op)* and Gabapentin  PO (pre-op)*   Induction: Intravenous  PONV Risk Score and Plan: 3 and Ondansetron , Dexamethasone  and Midazolam   Airway Management Planned: Oral ETT  Additional Equipment: None  Intra-op Plan:   Post-operative Plan: Extubation in OR  Informed Consent: I have reviewed the patients History and Physical, chart, labs and discussed the procedure including the risks, benefits and alternatives for the proposed anesthesia with the patient or authorized representative who has indicated his/her understanding and acceptance.     Dental advisory given  Plan Discussed with: CRNA  Anesthesia Plan Comments:        Anesthesia Quick Evaluation

## 2023-09-16 NOTE — Transfer of Care (Signed)
 Immediate Anesthesia Transfer of Care Note  Patient: Zeanna C Valley  Procedure(s) Performed: LIGATION, INTERNAL FISTULA TRACT (Rectum)  Patient Location: PACU  Anesthesia Type:General  Level of Consciousness: awake, alert , oriented, and patient cooperative  Airway & Oxygen Therapy: Patient Spontanous Breathing and Patient connected to face mask oxygen  Post-op Assessment: Report given to RN and Post -op Vital signs reviewed and stable  Post vital signs: Reviewed and stable  Last Vitals:  Vitals Value Taken Time  BP 116/53 09/16/23 0900  Temp    Pulse 78 09/16/23 0903  Resp 22 09/16/23 0903  SpO2 100 % 09/16/23 0903  Vitals shown include unfiled device data.  Last Pain:  Vitals:   09/16/23 0658  TempSrc: Temporal  PainSc: 3       Patients Stated Pain Goal: 3 (09/16/23 1610)  Complications: No notable events documented.

## 2023-09-16 NOTE — Discharge Instructions (Addendum)
 May take tylenol  after 1pm. May take anti-inflammatory meds after 1pm.   ANORECTAL SURGERY: POST OP INSTRUCTIONS Take your usually prescribed home medications unless otherwise directed. DIET: During the first few hours after surgery sip on some liquids until you are able to urinate.  It is normal to not urinate for several hours after this surgery.  If you feel uncomfortable, please contact the office for instructions.  After you are able to urinate,you may eat, if you feel like it.  Follow a light bland diet the first 24 hours after arrival home, such as soup, liquids, crackers, etc.  Be sure to include lots of fluids daily (6-8 glasses).  Avoid fast food or heavy meals, as your are more likely to get nauseated.  Eat a low fat diet the next few days after surgery.  Limit caffeine intake to 1-2 servings a day. PAIN CONTROL: Pain is best controlled by a usual combination of several different methods TOGETHER: Muscle relaxation: Soak in a warm bath (or Sitz bath) three times a day and after bowel movements.  Continue to do this until all pain is resolved. Over the counter pain medication Prescription pain medication Most patients will experience some swelling and discomfort in the anus/rectal area and incisions.  Heat such as warm towels, sitz baths, warm baths, etc to help relax tight/sore spots and speed recovery.  Some people prefer to use ice, especially in the first couple days after surgery, as it may decrease the pain and swelling, or alternate between ice & heat.  Experiment to what works for you.  Swelling and bruising can take several weeks to resolve.  Pain can take even longer to completely resolve. It is helpful to take an over-the-counter pain medication regularly for the first few weeks.  Choose one of the following that works best for you: Naproxen (Aleve, etc)  Two 220mg  tabs twice a day Ibuprofen  (Advil , etc) Three 200mg  tabs four times a day (every meal & bedtime) A  prescription for  pain medication (such as percocet, oxycodone , hydrocodone, etc) should be given to you upon discharge.  Take your pain medication as prescribed.  If you are having problems/concerns with the prescription medicine (does not control pain, nausea, vomiting, rash, itching, etc), please call us  [phone] to see if we need to switch you to a different pain medicine that will work better for you and/or control your side effect better. If you need a refill on your pain medication, please contact your pharmacy.  They will contact our office to request authorization. Prescriptions will not be filled after 5 pm or on week-ends. KEEP YOUR BOWELS REGULAR and AVOID CONSTIPATION The goal is one to two soft bowel movements a day.  You should at least have a bowel movement every other day. Avoid getting constipated.  Between the surgery and the pain medications, it is common to experience some constipation. This can be very painful after rectal surgery.  Increasing fluid intake and taking a fiber supplement (such as Metamucil, Citrucel, FiberCon, etc) 1-2 times a day regularly will usually help prevent this problem from occurring.  A stool softener like colace is also recommended.  This can be purchased over the counter at your pharmacy.  You can take it up to 3 times a day.  If you do not have a bowel movement after 24 hrs since your surgery, take one does of milk of magnesia.  If you still haven't had a bowel movement 8-12 hours after that dose, take another  dose.  If you don't have a bowel movement 48 hrs after surgery, purchase a Fleets enema from the drug store and administer gently per package instructions.  If you still are having trouble with your bowel movements after that, please call the office for further instructions. If you develop diarrhea or have many loose bowel movements, simplify your diet to bland foods & liquids for a few days.  Stop any stool softeners and decrease your fiber supplement.  Switching  to mild anti-diarrheal medications (Kayopectate, Pepto Bismol) can help.  If this worsens or does not improve, please call us .  Wound Care Remove your bandages before your first bowel movement or 8 hours after surgery.     Remove any wound packing material at this tim,e as well.  You do not need to repack the wound unless instructed otherwise.  Wear an absorbent pad or soft cotton gauze in your underwear to catch any drainage and help keep the area clean. You should change this every 2-3 hours while awake. Keep the area clean and dry.  Bathe / shower every day, especially after bowel movements.  Keep the area clean by showering / bathing over the incision / wound.   It is okay to soak an open wound to help wash it.  Wet wipes or showers / gentle washing after bowel movements is often less traumatic than regular toilet paper. You may have some styrofoam-like soft packing in the rectum which will come out with the first bowel movement.  You will often notice bleeding with bowel movements.  This should slow down by the end of the first week of surgery Expect some drainage.  This should slow down, too, by the end of the first week of surgery.  Wear an absorbent pad or soft cotton gauze in your underwear until the drainage stops. Do Not sit on a rubber or pillow ring.  This can make you symptoms worse.  You may sit on a soft pillow if needed.  ACTIVITIES as tolerated:   You may resume regular (light) daily activities beginning the next day--such as daily self-care, walking, climbing stairs--gradually increasing activities as tolerated.  If you can walk 30 minutes without difficulty, it is safe to try more intense activity such as jogging, treadmill, bicycling, low-impact aerobics, swimming, etc. Save the most intensive and strenuous activity for last such as sit-ups, heavy lifting, contact sports, etc  Refrain from any heavy lifting or straining until you are off narcotics for pain control.   You may drive  when you are no longer taking prescription pain medication, you can comfortably sit for long periods of time, and you can safely maneuver your car and apply brakes. You may have sexual intercourse when it is comfortable.  FOLLOW UP in our office Please call [facility] at [phone] to set up an appointment to see your surgeon in the office for a follow-up appointment approximately 3-4 weeks after your surgery. Make sure that you call for this appointment the day you arrive home to insure a convenient appointment time. 10. IF YOU HAVE DISABILITY OR FAMILY LEAVE FORMS, BRING THEM TO THE OFFICE FOR PROCESSING.  DO NOT GIVE THEM TO YOUR DOCTOR.     WHEN TO CALL US  ([phone]) [phone]: Poor pain control Reactions / problems with new medications (rash/itching, nausea, etc)  Fever over 101.5 F (38.5 C) Inability to urinate Nausea and/or vomiting Worsening swelling or bruising Continued bleeding from incision. Increased pain, redness, or drainage from the incision  The clinic  staff is available to answer your questions during regular business hours (8:30am-5pm).  Please don't hesitate to call and ask to speak to one of our nurses for clinical concerns.   A surgeon from [facility] is always on call at the hospitals   If you have a medical emergency, go to the nearest emergency room or call 911.    [facility] [address], [address], [place], [place]  [address] ? MAIN: [phone] ? TOLL FREE: [phone] ? FAX [phone] [email or web address]   Information for Discharge Teaching: EXPAREL  (bupivacaine  liposome injectable suspension)   Pain relief is important to your recovery. The goal is to control your pain so you can move easier and return to your normal activities as soon as possible after your procedure. Your physician may use several types of medicines to manage pain, swelling, and more.  Your surgeon or anesthesiologist gave you  EXPAREL (bupivacaine ) to help control your pain after surgery.  EXPAREL  is a local anesthetic designed to release slowly over an extended period of time to provide pain relief by numbing the tissue around the surgical site. EXPAREL  is designed to release pain medication over time and can control pain for up to 72 hours. Depending on how you respond to EXPAREL , you may require less pain medication during your recovery. EXPAREL  can help reduce or eliminate the need for opioids during the first few days after surgery when pain relief is needed the most. EXPAREL  is not an opioid and is not addictive. It does not cause sleepiness or sedation.   Important! A teal colored band has been placed on your arm with the date, time and amount of EXPAREL  you have received. Please leave this armband in place for the full 96 hours following administration, and then you may remove the band. If you return to the hospital for any reason within 96 hours following the administration of EXPAREL , the armband provides important information that your health care providers to know, and alerts them that you have received this anesthetic.    Possible side effects of EXPAREL : Temporary loss of sensation or ability to move in the area where medication was injected. Nausea, vomiting, constipation Rarely, numbness and tingling in your mouth or lips, lightheadedness, or anxiety may occur. Call your doctor right away if you think you may be experiencing any of these sensations, or if you have other questions regarding possible side effects.  Follow all other discharge instructions given to you by your surgeon or nurse. Eat a healthy diet and drink plenty of water  or other fluids.  Post Anesthesia Home Care Instructions  Activity: Get plenty of rest for the remainder of the day. A responsible individual must stay with you for 24 hours following the procedure.  For the next 24 hours, DO NOT: -Drive a car -Advertising copywriter -Drink  alcoholic beverages -Take any medication unless instructed by your physician -Make any legal decisions or sign important papers.  Meals: Start with liquid foods such as gelatin or soup. Progress to regular foods as tolerated. Avoid greasy, spicy, heavy foods. If nausea and/or vomiting occur, drink only clear liquids until the nausea and/or vomiting subsides. Call your physician if vomiting continues.  Special Instructions/Symptoms: Your throat may feel dry or sore from the anesthesia or the breathing tube placed in your throat during surgery. If this causes discomfort, gargle with warm salt water . The discomfort should disappear within 24 hours.  If you had a scopolamine  patch placed behind your ear  for the management of post- operative nausea and/or vomiting:  1. The medication in the patch is effective for 72 hours, after which it should be removed.  Wrap patch in a tissue and discard in the trash. Wash hands thoroughly with soap and water . 2. You may remove the patch earlier than 72 hours if you experience unpleasant side effects which may include dry mouth, dizziness or visual disturbances. 3. Avoid touching the patch. Wash your hands with soap and water  after contact with the patch.

## 2023-09-16 NOTE — Anesthesia Procedure Notes (Signed)
 Procedure Name: Intubation Date/Time: 09/16/2023 7:48 AM  Performed by: Lonia Ro, CRNAPre-anesthesia Checklist: Patient identified, Emergency Drugs available, Suction available, Patient being monitored and Timeout performed Patient Re-evaluated:Patient Re-evaluated prior to induction Oxygen Delivery Method: Circle system utilized Preoxygenation: Pre-oxygenation with 100% oxygen Induction Type: IV induction Ventilation: Mask ventilation without difficulty Laryngoscope Size: Mac and 3 Grade View: Grade I Tube type: Oral Tube size: 7.0 mm Number of attempts: 1 Airway Equipment and Method: Stylet Placement Confirmation: ETT inserted through vocal cords under direct vision, positive ETCO2 and breath sounds checked- equal and bilateral Secured at: 21 cm Tube secured with: Tape Dental Injury: Teeth and Oropharynx as per pre-operative assessment

## 2023-09-16 NOTE — Op Note (Signed)
 09/16/2023  8:49 AM  PATIENT:  Jamie Nelson  43 y.o. female  Patient Care Team: Jess Morita, MD as PCP - General (Family Medicine) Teresa Fender, CNM as Midwife Emilie Harden, MD as Consulting Physician (Internal Medicine)  PRE-OPERATIVE DIAGNOSIS:  ANAL FISTULA  POST-OPERATIVE DIAGNOSIS:  ANAL FISTULA  PROCEDURE:  LIGATION, INTERNAL FISTULA TRACT    Surgeon(s): Joyce Nixon, MD  ASSISTANT: none   ANESTHESIA:   local and general  SPECIMEN:  No Specimen  DISPOSITION OF SPECIMEN:  N/A  COUNTS:  YES  PLAN OF CARE: Discharge to home after PACU  PATIENT DISPOSITION:  PACU - hemodynamically stable.  INDICATION: 43 y.o. F with complex, recurrent anal fistula   OR FINDINGS: R anterior trans-sphincteric fistula with seton in place  DESCRIPTION: the patient was identified in the preoperative holding area and taken to the OR where they were laid on the operating room table.  General anesthesia was induced without difficulty. The patient was then positioned in prone jackknife position with buttocks gently taped apart.  The patient was then prepped and draped in usual sterile fashion.  SCDs were noted to be in place prior to the initiation of anesthesia. A surgical timeout was performed indicating the correct patient, procedure, positioning and need for preoperative antibiotics.  A rectal block was performed using Marcaine  with epinephrine  mixed with Experel.    I began with a digital rectal exam.  The anal canal was gently dilated.  I then placed a Hill-Ferguson anoscope into the anal canal and evaluated this completely.  The seton was in place.  The fistula is inserted into the fistula tract and the seton was removed.  I made an incision in the perianal space over the intersphincteric groove and fistula site using cautery.  This was carried down to the level of the anterior sphincteric groove and the 2 muscles were separated bluntly.  I entered into this area and  dissected down to below the fistula tract.   A [place] retractor was placed for additional visualization. I then used a right angle to dissect under the fistula tract and brought 2, 2-0 silk sutures around the fistula tract and tied them tightly with the fistula probe removed.  I then placed a figure-of-eight suture using 2-0 silk sutures x 2 in the external side bringing the sphincter muscle over top of the opening.  I confirmed good closure by inserting the fistula probe and injecting hydrogen peroxide .  This confirmed that the closure was watertight.  At this point I closed the intersphincteric groove with interrupted 3-0 Vicryl suture.  The subcutaneous tissue was also closed with interrupted 3-0 Vicryl suture.  The skin was closed using interrupted 2-0 chromic sutures.  The internal opening inside the anal canal was closed with a 2-0 chromic figure-of-eight suture.   A dressing was applied.  Additional local anesthesia was also applied for postoperative pain control.  Patient tolerated this well and was extubated and sent to the postanesthesia care unit in stable condition.  All counts were correct per the OR staff.  Fernande Howells, MD  Colorectal and General Surgery [facility]

## 2023-09-16 NOTE — Anesthesia Postprocedure Evaluation (Signed)
 Anesthesia Post Note  Patient: Jamie Nelson  Procedure(s) Performed: LIGATION, INTERNAL FISTULA TRACT (Rectum)     Patient location during evaluation: PACU Anesthesia Type: General Level of consciousness: awake and alert Pain management: pain level controlled Vital Signs Assessment: post-procedure vital signs reviewed and stable Respiratory status: spontaneous breathing, nonlabored ventilation, respiratory function stable and patient connected to nasal cannula oxygen Cardiovascular status: blood pressure returned to baseline and stable Postop Assessment: no apparent nausea or vomiting Anesthetic complications: no  No notable events documented.  Last Vitals:  Vitals:   09/16/23 0930 09/16/23 0943  BP: 115/62 104/62  Pulse: 64 62  Resp: 16   Temp:  (!) 36.2 C  SpO2: 97% 97%    Last Pain:  Vitals:   09/16/23 0943  TempSrc: Temporal  PainSc: 0-No pain                 Willian Harrow

## 2023-09-16 NOTE — Addendum Note (Signed)
 Addendum  created 09/16/23 1117 by Lonia Ro, CRNA   Flowsheet accepted, Intraprocedure Flowsheets edited

## 2023-09-17 ENCOUNTER — Encounter (HOSPITAL_BASED_OUTPATIENT_CLINIC_OR_DEPARTMENT_OTHER): Payer: Self-pay | Admitting: General Surgery

## 2023-09-27 DIAGNOSIS — E88819 Insulin resistance, unspecified: Secondary | ICD-10-CM | POA: Diagnosis not present

## 2023-09-27 DIAGNOSIS — E559 Vitamin D deficiency, unspecified: Secondary | ICD-10-CM | POA: Diagnosis not present

## 2023-09-27 DIAGNOSIS — E782 Mixed hyperlipidemia: Secondary | ICD-10-CM | POA: Diagnosis not present

## 2023-09-27 DIAGNOSIS — E66811 Obesity, class 1: Secondary | ICD-10-CM | POA: Diagnosis not present

## 2023-09-27 DIAGNOSIS — Z6834 Body mass index (BMI) 34.0-34.9, adult: Secondary | ICD-10-CM | POA: Diagnosis not present

## 2023-10-13 ENCOUNTER — Other Ambulatory Visit (HOSPITAL_COMMUNITY): Payer: Self-pay

## 2023-10-13 ENCOUNTER — Other Ambulatory Visit: Payer: Self-pay

## 2023-10-13 DIAGNOSIS — L700 Acne vulgaris: Secondary | ICD-10-CM | POA: Diagnosis not present

## 2023-10-13 MED ORDER — TRETINOIN 0.025 % EX CREA
1.0000 | TOPICAL_CREAM | Freq: Every evening | CUTANEOUS | 2 refills | Status: DC
  Filled 2023-10-13: qty 20, 30d supply, fill #0
  Filled 2024-02-10: qty 20, 30d supply, fill #1
  Filled 2024-04-24: qty 20, 30d supply, fill #2

## 2023-10-13 MED ORDER — CLINDAMYCIN PHOSPHATE 1 % EX LOTN
1.0000 | TOPICAL_LOTION | Freq: Every morning | CUTANEOUS | 2 refills | Status: AC
  Filled 2023-10-13: qty 60, 30d supply, fill #0
  Filled 2023-12-26: qty 60, 30d supply, fill #1
  Filled 2024-02-10: qty 60, 30d supply, fill #2

## 2023-10-14 ENCOUNTER — Other Ambulatory Visit (HOSPITAL_COMMUNITY): Payer: Self-pay

## 2023-10-28 ENCOUNTER — Other Ambulatory Visit (HOSPITAL_COMMUNITY): Payer: Self-pay

## 2023-10-28 DIAGNOSIS — E782 Mixed hyperlipidemia: Secondary | ICD-10-CM | POA: Diagnosis not present

## 2023-10-28 DIAGNOSIS — Z6834 Body mass index (BMI) 34.0-34.9, adult: Secondary | ICD-10-CM | POA: Diagnosis not present

## 2023-10-28 DIAGNOSIS — E88819 Insulin resistance, unspecified: Secondary | ICD-10-CM | POA: Diagnosis not present

## 2023-10-28 DIAGNOSIS — E66811 Obesity, class 1: Secondary | ICD-10-CM | POA: Diagnosis not present

## 2023-10-28 DIAGNOSIS — E559 Vitamin D deficiency, unspecified: Secondary | ICD-10-CM | POA: Diagnosis not present

## 2023-10-28 MED ORDER — PHENTERMINE-TOPIRAMATE ER 7.5-46 MG PO CP24
1.0000 | ORAL_CAPSULE | Freq: Every day | ORAL | 0 refills | Status: DC
  Filled 2023-11-01 – 2023-11-16 (×4): qty 30, 30d supply, fill #0

## 2023-10-28 MED ORDER — ONDANSETRON HCL 4 MG PO TABS
4.0000 mg | ORAL_TABLET | Freq: Three times a day (TID) | ORAL | 0 refills | Status: DC | PRN
  Filled 2023-10-28: qty 30, 10d supply, fill #0

## 2023-11-01 ENCOUNTER — Other Ambulatory Visit (HOSPITAL_COMMUNITY): Payer: Self-pay

## 2023-11-01 ENCOUNTER — Encounter (HOSPITAL_COMMUNITY): Payer: Self-pay

## 2023-11-11 ENCOUNTER — Other Ambulatory Visit (HOSPITAL_COMMUNITY): Payer: Self-pay

## 2023-11-11 ENCOUNTER — Telehealth: Admitting: Physician Assistant

## 2023-11-11 DIAGNOSIS — R3989 Other symptoms and signs involving the genitourinary system: Secondary | ICD-10-CM

## 2023-11-11 MED ORDER — CEPHALEXIN 500 MG PO CAPS
500.0000 mg | ORAL_CAPSULE | Freq: Two times a day (BID) | ORAL | 0 refills | Status: AC
  Filled 2023-11-11: qty 14, 7d supply, fill #0

## 2023-11-11 NOTE — Progress Notes (Signed)

## 2023-11-11 NOTE — Progress Notes (Signed)
 I have spent 5 minutes in review of e-visit questionnaire, review and updating patient chart, medical decision making and response to patient.   Piedad Climes, PA-C

## 2023-11-16 ENCOUNTER — Other Ambulatory Visit (HOSPITAL_COMMUNITY): Payer: Self-pay

## 2023-11-24 ENCOUNTER — Other Ambulatory Visit (HOSPITAL_COMMUNITY): Payer: Self-pay

## 2023-11-24 DIAGNOSIS — Z6833 Body mass index (BMI) 33.0-33.9, adult: Secondary | ICD-10-CM | POA: Diagnosis not present

## 2023-11-24 DIAGNOSIS — E782 Mixed hyperlipidemia: Secondary | ICD-10-CM | POA: Diagnosis not present

## 2023-11-24 DIAGNOSIS — E88819 Insulin resistance, unspecified: Secondary | ICD-10-CM | POA: Diagnosis not present

## 2023-11-24 DIAGNOSIS — E66811 Obesity, class 1: Secondary | ICD-10-CM | POA: Diagnosis not present

## 2023-11-24 DIAGNOSIS — E559 Vitamin D deficiency, unspecified: Secondary | ICD-10-CM | POA: Diagnosis not present

## 2023-12-26 ENCOUNTER — Other Ambulatory Visit (HOSPITAL_COMMUNITY): Payer: Self-pay

## 2023-12-27 ENCOUNTER — Other Ambulatory Visit (HOSPITAL_COMMUNITY): Payer: Self-pay

## 2023-12-27 ENCOUNTER — Other Ambulatory Visit: Payer: Self-pay

## 2023-12-27 MED ORDER — PHENTERMINE-TOPIRAMATE ER 7.5-46 MG PO CP24
1.0000 | ORAL_CAPSULE | Freq: Every day | ORAL | 0 refills | Status: DC
  Filled 2023-12-27: qty 30, 30d supply, fill #0

## 2023-12-28 ENCOUNTER — Encounter: Payer: Self-pay | Admitting: Family Medicine

## 2023-12-28 NOTE — Telephone Encounter (Signed)
 Is there a cancellation list that the patient can be added to?

## 2023-12-28 NOTE — Telephone Encounter (Signed)
 She has been added to the waitlist in case of cancellation

## 2023-12-29 NOTE — Telephone Encounter (Signed)
 Patient is not able to come to offered appointment. She would like to still be on the waitlist

## 2024-01-05 ENCOUNTER — Encounter: Payer: Self-pay | Admitting: Family Medicine

## 2024-01-05 ENCOUNTER — Ambulatory Visit (INDEPENDENT_AMBULATORY_CARE_PROVIDER_SITE_OTHER): Admitting: Family Medicine

## 2024-01-05 VITALS — BP 124/78 | HR 78 | Temp 98.0°F | Ht 65.0 in | Wt 207.0 lb

## 2024-01-05 DIAGNOSIS — Z0000 Encounter for general adult medical examination without abnormal findings: Secondary | ICD-10-CM

## 2024-01-05 NOTE — Patient Instructions (Signed)
 Follow up in 1 year or as needed No need to repeat labs- yay! Continue to work on healthy diet and regular exercise- you can do it! Let me know about your Tdap Call with any questions or concerns Stay Safe!  Stay Healthy! Enjoy the rest of your summer!!!

## 2024-01-05 NOTE — Assessment & Plan Note (Signed)
 Pt's PE WNL w/ exception of BMI.  UTD on pap, mammo.  Thinks she had Tdap- will let me know.  Reviewed recent labs.  No need to repeat.  Anticipatory guidance provided.

## 2024-01-05 NOTE — Progress Notes (Signed)
   Subjective:    Patient ID: Jamie Nelson, female    DOB: February 25, 1981, 43 y.o.   MRN: [id number]  HPI CPE- UTD on pap/mammo (Dr Barbette)  Had labs done at [facility]- submitted via MyChart  Patient Care Team    Relationship Specialty Notifications Start End  Mahlon Comer BRAVO, MD PCP - General Family Medicine  06/04/17   Con Merle, CNM Midwife   06/29/11   Trixie File, MD Consulting Physician Internal Medicine  06/04/17      Health Maintenance  Topic Date Due   Hepatitis C Screening  Never done   Hepatitis B Vaccines (1 of 3 - 19+ 3-dose series) Never done   HPV VACCINES (1 - 3-dose SCDM series) Never done   Cervical Cancer Screening (HPV/Pap Cotest)  06/05/2019   COVID-19 Vaccine (3 - Pfizer risk series) 07/01/2019   DTaP/Tdap/Td (3 - Td or Tdap) 02/27/2022   INFLUENZA VACCINE  12/24/2023   HIV Screening  Completed   Meningococcal B Vaccine  Aged Out      Review of Systems Patient reports no vision/ hearing changes, adenopathy,fever, weight change,  persistant/recurrent hoarseness , swallowing issues, chest pain, palpitations, edema, persistant/recurrent cough, hemoptysis, dyspnea (rest/exertional/paroxysmal nocturnal), gastrointestinal bleeding (melena, rectal bleeding), abdominal pain, significant heartburn, bowel changes, GU symptoms (dysuria, hematuria, incontinence), Gyn symptoms (abnormal  bleeding, pain),  syncope, focal weakness, memory loss, numbness & tingling, skin/hair/nail changes, abnormal bruising or bleeding, anxiety, or depression.     Objective:   Physical Exam General Appearance:    Alert, cooperative, no distress, appears stated age  Head:    Normocephalic, without obvious abnormality, atraumatic  Eyes:    PERRL, conjunctiva/corneas clear, EOM's intact both eyes  Ears:    Normal TM's and external ear canals, both ears  Nose:   Nares normal, septum midline, mucosa normal, no drainage    or sinus tenderness  Throat:   Lips, mucosa, and tongue  normal; teeth and gums normal  Neck:   Supple, symmetrical, trachea midline, no adenopathy;    Thyroid : no enlargement/tenderness/nodules  Back:     Symmetric, no curvature, ROM normal, no CVA tenderness  Lungs:     Clear to auscultation bilaterally, respirations unlabored  Chest Wall:    No tenderness or deformity   Heart:    Regular rate and rhythm, S1 and S2 normal, no murmur, rub   or gallop  Breast Exam:    Deferred to GYN  Abdomen:     Soft, non-tender, bowel sounds active all four quadrants,    no masses, no organomegaly  Genitalia:    Deferred to GYN  Rectal:    Extremities:   Extremities normal, atraumatic, no cyanosis or edema  Pulses:   2+ and symmetric all extremities  Skin:   Skin color, texture, turgor normal, no rashes or lesions  Lymph nodes:   Cervical, supraclavicular, and axillary nodes normal  Neurologic:   CNII-XII intact, normal strength, sensation and reflexes    throughout          Assessment & Plan:

## 2024-01-17 ENCOUNTER — Encounter: Payer: Self-pay | Admitting: Family Medicine

## 2024-01-17 ENCOUNTER — Other Ambulatory Visit (HOSPITAL_COMMUNITY): Payer: Self-pay

## 2024-01-17 DIAGNOSIS — E782 Mixed hyperlipidemia: Secondary | ICD-10-CM | POA: Diagnosis not present

## 2024-01-17 DIAGNOSIS — E66811 Obesity, class 1: Secondary | ICD-10-CM | POA: Diagnosis not present

## 2024-01-17 DIAGNOSIS — Z6832 Body mass index (BMI) 32.0-32.9, adult: Secondary | ICD-10-CM | POA: Diagnosis not present

## 2024-01-17 DIAGNOSIS — E88819 Insulin resistance, unspecified: Secondary | ICD-10-CM | POA: Diagnosis not present

## 2024-01-17 DIAGNOSIS — E559 Vitamin D deficiency, unspecified: Secondary | ICD-10-CM | POA: Diagnosis not present

## 2024-01-17 DIAGNOSIS — R41840 Attention and concentration deficit: Secondary | ICD-10-CM | POA: Diagnosis not present

## 2024-01-17 MED ORDER — PHENTERMINE-TOPIRAMATE ER 7.5-46 MG PO CP24
1.0000 | ORAL_CAPSULE | Freq: Every day | ORAL | 0 refills | Status: DC
  Filled 2024-01-17 – 2024-01-26 (×2): qty 30, 30d supply, fill #0

## 2024-01-17 NOTE — Telephone Encounter (Signed)
 Patient is wanting a Dtap. Last one was 02/2012. Okay to schedule nurse visit?

## 2024-01-26 ENCOUNTER — Other Ambulatory Visit (HOSPITAL_COMMUNITY): Payer: Self-pay

## 2024-01-29 ENCOUNTER — Other Ambulatory Visit (HOSPITAL_COMMUNITY): Payer: Self-pay

## 2024-01-29 ENCOUNTER — Telehealth: Admitting: Nurse Practitioner

## 2024-01-29 DIAGNOSIS — R399 Unspecified symptoms and signs involving the genitourinary system: Secondary | ICD-10-CM

## 2024-01-29 MED ORDER — CEPHALEXIN 500 MG PO CAPS
500.0000 mg | ORAL_CAPSULE | Freq: Two times a day (BID) | ORAL | 0 refills | Status: AC
  Filled 2024-01-29 (×2): qty 14, 7d supply, fill #0

## 2024-01-29 NOTE — Progress Notes (Signed)
 I have spent 5 minutes in review of e-visit questionnaire, review and updating patient chart, medical decision making and response to patient.   Claiborne Rigg, NP

## 2024-01-29 NOTE — Progress Notes (Signed)

## 2024-02-10 ENCOUNTER — Other Ambulatory Visit (HOSPITAL_COMMUNITY): Payer: Self-pay

## 2024-02-24 ENCOUNTER — Other Ambulatory Visit (HOSPITAL_COMMUNITY): Payer: Self-pay

## 2024-02-24 DIAGNOSIS — R41840 Attention and concentration deficit: Secondary | ICD-10-CM | POA: Diagnosis not present

## 2024-02-24 DIAGNOSIS — E88819 Insulin resistance, unspecified: Secondary | ICD-10-CM | POA: Diagnosis not present

## 2024-02-24 DIAGNOSIS — E66811 Obesity, class 1: Secondary | ICD-10-CM | POA: Diagnosis not present

## 2024-02-24 DIAGNOSIS — E782 Mixed hyperlipidemia: Secondary | ICD-10-CM | POA: Diagnosis not present

## 2024-02-24 DIAGNOSIS — Z6832 Body mass index (BMI) 32.0-32.9, adult: Secondary | ICD-10-CM | POA: Diagnosis not present

## 2024-02-24 DIAGNOSIS — E559 Vitamin D deficiency, unspecified: Secondary | ICD-10-CM | POA: Diagnosis not present

## 2024-02-24 MED ORDER — PHENTERMINE HCL 37.5 MG PO TABS
18.7500 mg | ORAL_TABLET | Freq: Every morning | ORAL | 0 refills | Status: DC
  Filled 2024-02-24: qty 30, 30d supply, fill #0

## 2024-03-07 ENCOUNTER — Encounter: Admitting: Family Medicine

## 2024-04-24 ENCOUNTER — Other Ambulatory Visit (HOSPITAL_COMMUNITY): Payer: Self-pay

## 2024-04-24 ENCOUNTER — Other Ambulatory Visit: Payer: Self-pay

## 2024-04-25 ENCOUNTER — Other Ambulatory Visit (HOSPITAL_COMMUNITY): Payer: Self-pay

## 2024-04-26 ENCOUNTER — Other Ambulatory Visit (HOSPITAL_COMMUNITY): Payer: Self-pay

## 2024-04-28 ENCOUNTER — Other Ambulatory Visit (HOSPITAL_COMMUNITY): Payer: Self-pay

## 2024-05-01 ENCOUNTER — Other Ambulatory Visit (HOSPITAL_COMMUNITY): Payer: Self-pay

## 2024-05-04 DIAGNOSIS — H5213 Myopia, bilateral: Secondary | ICD-10-CM | POA: Diagnosis not present

## 2024-06-09 ENCOUNTER — Other Ambulatory Visit (HOSPITAL_COMMUNITY): Payer: Self-pay

## 2024-06-25 ENCOUNTER — Other Ambulatory Visit (HOSPITAL_COMMUNITY): Payer: Self-pay

## 2024-06-25 ENCOUNTER — Telehealth: Admitting: Family

## 2024-06-25 DIAGNOSIS — R399 Unspecified symptoms and signs involving the genitourinary system: Secondary | ICD-10-CM

## 2024-06-25 MED ORDER — CEPHALEXIN 500 MG PO CAPS
500.0000 mg | ORAL_CAPSULE | Freq: Two times a day (BID) | ORAL | 0 refills | Status: AC
  Filled 2024-06-25 (×2): qty 14, 7d supply, fill #0

## 2024-06-25 NOTE — Progress Notes (Signed)

## 2024-06-30 ENCOUNTER — Encounter (HOSPITAL_COMMUNITY): Payer: Self-pay

## 2024-06-30 ENCOUNTER — Other Ambulatory Visit (HOSPITAL_COMMUNITY): Payer: Self-pay

## 2024-06-30 ENCOUNTER — Other Ambulatory Visit: Payer: Self-pay

## 2024-06-30 ENCOUNTER — Other Ambulatory Visit: Payer: Self-pay | Admitting: Obstetrics & Gynecology

## 2024-06-30 DIAGNOSIS — Z1231 Encounter for screening mammogram for malignant neoplasm of breast: Secondary | ICD-10-CM

## 2024-06-30 MED ORDER — METFORMIN HCL ER 500 MG PO TB24
500.0000 mg | ORAL_TABLET | Freq: Two times a day (BID) | ORAL | 0 refills | Status: AC
  Filled 2024-06-30: qty 180, 90d supply, fill #0
  Filled ????-??-??: fill #0

## 2024-06-30 MED ORDER — CLINDAMYCIN PHOSPHATE 1 % EX LOTN
1.0000 | TOPICAL_LOTION | Freq: Every morning | CUTANEOUS | 2 refills | Status: AC
  Filled 2024-06-30: qty 60, 30d supply, fill #0

## 2024-07-13 ENCOUNTER — Ambulatory Visit

## 2025-01-18 ENCOUNTER — Encounter: Admitting: Family Medicine
